# Patient Record
Sex: Female | Born: 1935 | Race: White | Hispanic: No | Smoking: Never smoker
Health system: Southern US, Community
[De-identification: ages and names within clinical notes are randomized; demographics above are authoritative.]

## PROBLEM LIST (undated history)

## (undated) DIAGNOSIS — N19 Unspecified kidney failure: Secondary | ICD-10-CM

## (undated) DIAGNOSIS — I10 Essential (primary) hypertension: Secondary | ICD-10-CM

## (undated) DIAGNOSIS — R809 Proteinuria, unspecified: Secondary | ICD-10-CM

## (undated) DIAGNOSIS — E119 Type 2 diabetes mellitus without complications: Secondary | ICD-10-CM

## (undated) DIAGNOSIS — M1990 Unspecified osteoarthritis, unspecified site: Secondary | ICD-10-CM

## (undated) HISTORY — PX: TOTAL HIP ARTHROPLASTY: SHX124

## (undated) HISTORY — PX: RENAL BIOPSY: SHX156

## (undated) HISTORY — PX: EYE SURGERY: SHX253

---

## 2011-12-28 DIAGNOSIS — E785 Hyperlipidemia, unspecified: Secondary | ICD-10-CM | POA: Insufficient documentation

## 2011-12-28 DIAGNOSIS — H269 Unspecified cataract: Secondary | ICD-10-CM | POA: Insufficient documentation

## 2011-12-28 DIAGNOSIS — M810 Age-related osteoporosis without current pathological fracture: Secondary | ICD-10-CM | POA: Insufficient documentation

## 2012-02-08 DIAGNOSIS — R600 Localized edema: Secondary | ICD-10-CM | POA: Insufficient documentation

## 2012-03-13 DIAGNOSIS — K5900 Constipation, unspecified: Secondary | ICD-10-CM | POA: Insufficient documentation

## 2012-04-10 DIAGNOSIS — M1712 Unilateral primary osteoarthritis, left knee: Secondary | ICD-10-CM | POA: Insufficient documentation

## 2012-07-08 DIAGNOSIS — Z96649 Presence of unspecified artificial hip joint: Secondary | ICD-10-CM | POA: Insufficient documentation

## 2012-07-16 DIAGNOSIS — G894 Chronic pain syndrome: Secondary | ICD-10-CM | POA: Insufficient documentation

## 2012-09-18 DIAGNOSIS — N185 Chronic kidney disease, stage 5: Secondary | ICD-10-CM | POA: Insufficient documentation

## 2013-01-21 DIAGNOSIS — D649 Anemia, unspecified: Secondary | ICD-10-CM | POA: Insufficient documentation

## 2015-05-03 DIAGNOSIS — I491 Atrial premature depolarization: Secondary | ICD-10-CM | POA: Insufficient documentation

## 2015-05-03 DIAGNOSIS — R001 Bradycardia, unspecified: Secondary | ICD-10-CM | POA: Insufficient documentation

## 2015-06-29 DIAGNOSIS — L602 Onychogryphosis: Secondary | ICD-10-CM | POA: Insufficient documentation

## 2015-06-29 DIAGNOSIS — B351 Tinea unguium: Secondary | ICD-10-CM | POA: Insufficient documentation

## 2016-03-06 DIAGNOSIS — M059 Rheumatoid arthritis with rheumatoid factor, unspecified: Secondary | ICD-10-CM | POA: Insufficient documentation

## 2016-07-02 ENCOUNTER — Encounter: Payer: Self-pay | Admitting: Emergency Medicine

## 2016-07-02 ENCOUNTER — Inpatient Hospital Stay
Admission: EM | Admit: 2016-07-02 | Discharge: 2016-07-04 | DRG: 690 | Disposition: A | Discharge status: Home Health | Payer: Medicare Other | Attending: Internal Medicine | Admitting: Internal Medicine

## 2016-07-02 DIAGNOSIS — Z9109 Other allergy status, other than to drugs and biological substances: Secondary | ICD-10-CM

## 2016-07-02 DIAGNOSIS — R609 Edema, unspecified: Secondary | ICD-10-CM

## 2016-07-02 DIAGNOSIS — N10 Acute tubulo-interstitial nephritis: Principal | ICD-10-CM | POA: Diagnosis present

## 2016-07-02 DIAGNOSIS — Z79899 Other long term (current) drug therapy: Secondary | ICD-10-CM

## 2016-07-02 DIAGNOSIS — N184 Chronic kidney disease, stage 4 (severe): Secondary | ICD-10-CM | POA: Diagnosis present

## 2016-07-02 DIAGNOSIS — R109 Unspecified abdominal pain: Secondary | ICD-10-CM

## 2016-07-02 DIAGNOSIS — Z1611 Resistance to penicillins: Secondary | ICD-10-CM | POA: Diagnosis present

## 2016-07-02 DIAGNOSIS — R300 Dysuria: Secondary | ICD-10-CM

## 2016-07-02 DIAGNOSIS — D72829 Elevated white blood cell count, unspecified: Secondary | ICD-10-CM

## 2016-07-02 DIAGNOSIS — E1122 Type 2 diabetes mellitus with diabetic chronic kidney disease: Secondary | ICD-10-CM | POA: Diagnosis present

## 2016-07-02 DIAGNOSIS — B9620 Unspecified Escherichia coli [E. coli] as the cause of diseases classified elsewhere: Secondary | ICD-10-CM

## 2016-07-02 DIAGNOSIS — I129 Hypertensive chronic kidney disease with stage 1 through stage 4 chronic kidney disease, or unspecified chronic kidney disease: Secondary | ICD-10-CM | POA: Diagnosis present

## 2016-07-02 DIAGNOSIS — R319 Hematuria, unspecified: Secondary | ICD-10-CM | POA: Diagnosis present

## 2016-07-02 DIAGNOSIS — N390 Urinary tract infection, site not specified: Secondary | ICD-10-CM

## 2016-07-02 DIAGNOSIS — Z96641 Presence of right artificial hip joint: Secondary | ICD-10-CM | POA: Diagnosis present

## 2016-07-02 DIAGNOSIS — E1121 Type 2 diabetes mellitus with diabetic nephropathy: Secondary | ICD-10-CM | POA: Diagnosis present

## 2016-07-02 DIAGNOSIS — Z7982 Long term (current) use of aspirin: Secondary | ICD-10-CM

## 2016-07-02 DIAGNOSIS — N12 Tubulo-interstitial nephritis, not specified as acute or chronic: Secondary | ICD-10-CM

## 2016-07-02 DIAGNOSIS — N19 Unspecified kidney failure: Secondary | ICD-10-CM

## 2016-07-02 DIAGNOSIS — Z1623 Resistance to quinolones and fluoroquinolones: Secondary | ICD-10-CM | POA: Diagnosis present

## 2016-07-02 DIAGNOSIS — N179 Acute kidney failure, unspecified: Secondary | ICD-10-CM | POA: Diagnosis present

## 2016-07-02 DIAGNOSIS — I10 Essential (primary) hypertension: Secondary | ICD-10-CM

## 2016-07-02 DIAGNOSIS — N189 Chronic kidney disease, unspecified: Secondary | ICD-10-CM

## 2016-07-02 HISTORY — DX: Proteinuria, unspecified: R80.9

## 2016-07-02 HISTORY — DX: Unspecified kidney failure: N19

## 2016-07-02 HISTORY — DX: Essential (primary) hypertension: I10

## 2016-07-02 HISTORY — DX: Type 2 diabetes mellitus without complications: E11.9

## 2016-07-02 LAB — CBC WITH DIFFERENTIAL/PLATELET
BASOS ABS: 0.1 10*3/uL (ref 0–0.1)
Basophils Relative: 0 %
Eosinophils Absolute: 0.2 10*3/uL (ref 0–0.7)
Eosinophils Relative: 1 %
HEMATOCRIT: 40.4 % (ref 35.0–47.0)
HEMOGLOBIN: 13.8 g/dL (ref 12.0–16.0)
Lymphocytes Relative: 6 %
Lymphs Abs: 1 10*3/uL (ref 1.0–3.6)
MCH: 30 pg (ref 26.0–34.0)
MCHC: 34.2 g/dL (ref 32.0–36.0)
MCV: 87.9 fL (ref 80.0–100.0)
MONOS PCT: 7 %
Monocytes Absolute: 1.3 10*3/uL — ABNORMAL HIGH (ref 0.2–0.9)
NEUTROS ABS: 14.8 10*3/uL — AB (ref 1.4–6.5)
NEUTROS PCT: 86 %
Platelets: 224 10*3/uL (ref 150–440)
RBC: 4.6 MIL/uL (ref 3.80–5.20)
RDW: 13.8 % (ref 11.5–14.5)
WBC: 17.4 10*3/uL — ABNORMAL HIGH (ref 3.6–11.0)

## 2016-07-02 LAB — URINALYSIS COMPLETE WITH MICROSCOPIC (ARMC ONLY)
Bilirubin Urine: NEGATIVE
Glucose, UA: NEGATIVE mg/dL
Ketones, ur: NEGATIVE mg/dL
NITRITE: NEGATIVE
PH: 6 (ref 5.0–8.0)
Protein, ur: 100 mg/dL — AB
Specific Gravity, Urine: 1.006 (ref 1.005–1.030)
Squamous Epithelial / LPF: NONE SEEN

## 2016-07-02 LAB — COMPREHENSIVE METABOLIC PANEL
ALBUMIN: 4.2 g/dL (ref 3.5–5.0)
ALT: 14 U/L (ref 14–54)
AST: 20 U/L (ref 15–41)
Alkaline Phosphatase: 69 U/L (ref 38–126)
Anion gap: 8 (ref 5–15)
BILIRUBIN TOTAL: 0.6 mg/dL (ref 0.3–1.2)
BUN: 39 mg/dL — ABNORMAL HIGH (ref 6–20)
CHLORIDE: 106 mmol/L (ref 101–111)
CO2: 26 mmol/L (ref 22–32)
Calcium: 9.8 mg/dL (ref 8.9–10.3)
Creatinine, Ser: 2.37 mg/dL — ABNORMAL HIGH (ref 0.44–1.00)
GFR calc Af Amer: 21 mL/min — ABNORMAL LOW (ref >60)
GFR, EST NON AFRICAN AMERICAN: 19 mL/min — AB (ref >60)
Glucose, Bld: 145 mg/dL — ABNORMAL HIGH (ref 65–99)
POTASSIUM: 3.8 mmol/L (ref 3.5–5.1)
Sodium: 140 mmol/L (ref 135–145)
TOTAL PROTEIN: 7.3 g/dL (ref 6.5–8.1)

## 2016-07-02 LAB — LACTIC ACID, PLASMA
LACTIC ACID, VENOUS: 1.6 mmol/L (ref 0.5–1.9)
LACTIC ACID, VENOUS: 1.4 mmol/L (ref 0.5–1.9)

## 2016-07-02 LAB — GLUCOSE, CAPILLARY
Glucose-Capillary: 119 mg/dL — ABNORMAL HIGH (ref 65–99)
Glucose-Capillary: 148 mg/dL — ABNORMAL HIGH (ref 65–99)

## 2016-07-02 MED ORDER — INSULIN ASPART 100 UNIT/ML ~~LOC~~ SOLN: 0.0000 [IU] | Freq: Three times a day (TID) | SUBCUTANEOUS | Status: DC

## 2016-07-02 MED ORDER — ACETAMINOPHEN 650 MG RE SUPP: 650.0000 mg | Freq: Four times a day (QID) | RECTAL | Status: DC | PRN

## 2016-07-02 MED ORDER — HEPARIN SODIUM (PORCINE) 5000 UNIT/ML IJ SOLN
5000.0000 [IU] | Freq: Three times a day (TID) | INTRAMUSCULAR | Status: DC
  Administered 2016-07-02 – 2016-07-03 (×2): 5000 [IU] via SUBCUTANEOUS
  Filled 2016-07-02 (×2): qty 1

## 2016-07-02 MED ORDER — CEFTRIAXONE SODIUM 1 G IJ SOLR
1.0000 g | Freq: Two times a day (BID) | INTRAMUSCULAR | Status: DC
  Administered 2016-07-02 – 2016-07-03 (×3): 1 g via INTRAVENOUS
  Filled 2016-07-02 (×5): qty 10

## 2016-07-02 MED ORDER — ONDANSETRON HCL 4 MG/2ML IJ SOLN: 4.0000 mg | Freq: Four times a day (QID) | INTRAMUSCULAR | Status: DC | PRN

## 2016-07-02 MED ORDER — ONDANSETRON HCL 4 MG PO TABS: 4.0000 mg | ORAL_TABLET | Freq: Four times a day (QID) | ORAL | Status: DC | PRN

## 2016-07-02 MED ORDER — LEVOTHYROXINE SODIUM 137 MCG PO TABS
137.0000 ug | ORAL_TABLET | Freq: Every day | ORAL | Status: DC
  Administered 2016-07-03: 137 ug via ORAL
  Filled 2016-07-02 (×2): qty 1

## 2016-07-02 MED ORDER — SODIUM CHLORIDE 0.9 % IV SOLN
INTRAVENOUS | Status: DC
  Administered 2016-07-02 – 2016-07-04 (×3): via INTRAVENOUS

## 2016-07-02 MED ORDER — DEXTROSE 5 % IV SOLN
1.0000 g | Freq: Once | INTRAVENOUS | Status: AC
  Administered 2016-07-02: 1 g via INTRAVENOUS
  Filled 2016-07-02: qty 10

## 2016-07-02 MED ORDER — AMLODIPINE BESYLATE 5 MG PO TABS
2.5000 mg | ORAL_TABLET | ORAL | Status: DC
  Administered 2016-07-03: 2.5 mg via ORAL
  Filled 2016-07-02: qty 1

## 2016-07-02 MED ORDER — MORPHINE SULFATE (PF) 2 MG/ML IV SOLN: 2.0000 mg | INTRAVENOUS | Status: DC | PRN

## 2016-07-02 MED ORDER — FERROUS SULFATE 325 (65 FE) MG PO TABS
325.0000 mg | ORAL_TABLET | Freq: Every day | ORAL | Status: DC
  Administered 2016-07-03: 325 mg via ORAL
  Filled 2016-07-02: qty 1

## 2016-07-02 MED ORDER — OXYCODONE HCL 5 MG PO TABS
10.0000 mg | ORAL_TABLET | Freq: Four times a day (QID) | ORAL | Status: DC | PRN
  Administered 2016-07-02 – 2016-07-04 (×2): 10 mg via ORAL
  Filled 2016-07-02 (×2): qty 2

## 2016-07-02 MED ORDER — VITAMIN D 1000 UNITS PO TABS
2000.0000 [IU] | ORAL_TABLET | ORAL | Status: DC
  Administered 2016-07-03 – 2016-07-04 (×2): 2000 [IU] via ORAL
  Filled 2016-07-02 (×2): qty 2

## 2016-07-02 MED ORDER — DEXTROSE 5 % IV SOLN: 1.0000 g | Freq: Two times a day (BID) | INTRAVENOUS | Status: DC

## 2016-07-02 MED ORDER — VITAMIN B-12 1000 MCG PO TABS
1000.0000 ug | ORAL_TABLET | ORAL | Status: DC
  Administered 2016-07-03 – 2016-07-04 (×2): 1000 ug via ORAL
  Filled 2016-07-02 (×2): qty 1

## 2016-07-02 MED ORDER — SODIUM CHLORIDE 0.9 % IV BOLUS (SEPSIS)
1000.0000 mL | Freq: Once | INTRAVENOUS | Status: AC
  Administered 2016-07-02: 1000 mL via INTRAVENOUS

## 2016-07-02 MED ORDER — DOCUSATE SODIUM 100 MG PO CAPS
100.0000 mg | ORAL_CAPSULE | Freq: Two times a day (BID) | ORAL | Status: DC
  Administered 2016-07-02 – 2016-07-03 (×3): 100 mg via ORAL
  Filled 2016-07-02 (×4): qty 1

## 2016-07-02 MED ORDER — ACETAMINOPHEN 325 MG PO TABS: 650.0000 mg | ORAL_TABLET | Freq: Four times a day (QID) | ORAL | Status: DC | PRN

## 2016-07-02 MED ORDER — HYDROXYCHLOROQUINE SULFATE 200 MG PO TABS
200.0000 mg | ORAL_TABLET | ORAL | Status: DC
  Administered 2016-07-03 – 2016-07-04 (×2): 200 mg via ORAL
  Filled 2016-07-02 (×2): qty 1

## 2016-07-02 MED ORDER — GABAPENTIN 100 MG PO CAPS
100.0000 mg | ORAL_CAPSULE | Freq: Every day | ORAL | Status: DC
  Administered 2016-07-02 – 2016-07-03 (×2): 100 mg via ORAL
  Filled 2016-07-02 (×2): qty 1

## 2016-07-02 MED ORDER — BISACODYL 10 MG RE SUPP: 10.0000 mg | Freq: Every day | RECTAL | Status: DC | PRN

## 2016-07-02 MED ORDER — PANTOPRAZOLE SODIUM 40 MG PO TBEC
40.0000 mg | DELAYED_RELEASE_TABLET | Freq: Every day | ORAL | Status: DC
  Administered 2016-07-02 – 2016-07-03 (×2): 40 mg via ORAL
  Filled 2016-07-02 (×3): qty 1

## 2016-07-02 MED ORDER — ASPIRIN EC 81 MG PO TBEC
81.0000 mg | DELAYED_RELEASE_TABLET | ORAL | Status: DC
  Administered 2016-07-03: 81 mg via ORAL
  Filled 2016-07-02: qty 1

## 2016-07-02 MED ORDER — PREDNISONE 10 MG PO TABS
5.0000 mg | ORAL_TABLET | ORAL | Status: DC
  Administered 2016-07-03 – 2016-07-04 (×2): 5 mg via ORAL
  Filled 2016-07-02 (×2): qty 1

## 2016-07-02 NOTE — ED Notes (Signed)
Patient transported to floor. Clean brief on patient. No further needs at this time.

## 2016-07-02 NOTE — ED Notes (Signed)
Attempted to call report x 1  

## 2016-07-02 NOTE — ED Notes (Signed)
Soiled brief changed at this time. 

## 2016-07-02 NOTE — ED Triage Notes (Signed)
Per [facility], patient comes from home with c/o hematuria. Patient had a left kidney biopsy done at [facility] on 06/28/16. Patient states she noticed the bleeding this morning. Patient also c/o 4/10 lower abd pain. Patient is A&O x4. Patient denies dysuria. Patient did take an 10mg  of oxycodone around 0645.

## 2016-07-02 NOTE — ED Provider Notes (Signed)
[facility] Emergency Department Provider Note   ____________________________________________  Time seen:  I have reviewed the triage vital signs and the triage nursing note.  HISTORY  Chief Complaint Hematuria   Historian Patient  HPI Christina Mercado is a 80 y.o. female with a history of kidney disease hypertension and diabetes who had a renal biopsy on 06/28/16 with a [facility] urologist, presents today because of frequency of urination over the last 24 hours, hematuria over the last 24 hours, and suprapubic discomfort the last 24 hours.  After the left-sided renal biopsy a few days ago she didn't have any issues until about 24 hours ago.  Nothing seems to make it worse or better. No fevers. No vomiting. No back or flank pain.    Past Medical History:  Diagnosis Date  . Diabetes mellitus without complication ([place])   . Hypertension   . Kidney failure    stage 3  . Proteinuria     There are no active problems to display for this patient.   Past Surgical History:  Procedure Laterality Date  . RENAL BIOPSY    . TOTAL HIP ARTHROPLASTY Right       Allergies Byetta 10 mcg pen [exenatide]  No family history on file.  Social History Social History  Substance Use Topics  . Smoking status: Never Smoker  . Smokeless tobacco: Never Used  . Alcohol use No    Review of Systems  Constitutional: Negative for fever. Eyes: Negative for visual changes. ENT: Negative for sore throat. Cardiovascular: Negative for chest pain. Respiratory: Negative for shortness of breath. Gastrointestinal: Negative for vomiting and diarrhea. Genitourinary: Positive for dysuria. Musculoskeletal: Negative for back pain. Skin: Negative for rash. Neurological: Negative for headache. 10 point Review of Systems otherwise negative ____________________________________________   PHYSICAL EXAM:  VITAL SIGNS: ED Triage Vitals  Enc Vitals Group     BP 07/02/16 0824 (!)  186/79     Pulse Rate 07/02/16 0824 93     Resp 07/02/16 0900 (!) 21     Temp 07/02/16 0824 98.4 F (36.9 C)     Temp src --      SpO2 07/02/16 0824 100 %     Weight 07/02/16 0824 222 lb (100.7 kg)     Height 07/02/16 0824 5\' 6"  (1.676 m)     Head Circumference --      Peak Flow --      Pain Score 07/02/16 0824 4     Pain Loc --      Pain Edu? --      Excl. in [place]? --      Constitutional: Alert and oriented. Well appearing and in no distress. HEENT   Head: Normocephalic and atraumatic.      Eyes: Conjunctivae are normal. PERRL. Normal extraocular movements.      Ears:         Nose: No congestion/rhinnorhea.   Mouth/Throat: Mucous membranes are moist.   Neck: No stridor. Cardiovascular/Chest: Normal rate, regular rhythm.  No murmurs, rubs, or gallops. Respiratory: Normal respiratory effort without tachypnea nor retractions. Breath sounds are clear and equal bilaterally. No wheezes/rales/rhonchi. Gastrointestinal: Soft. No distention, no guarding, no rebound. Nontender.  Mild suprapubic discomfort. No focal McBurney's point tenderness. No upper abdominal tenderness.  Genitourinary/rectal:Deferred Musculoskeletal: Nontender with normal range of motion in all extremities. No joint effusions.  No lower extremity tenderness.  No edema. Neurologic:  Normal speech and language. No gross or focal neurologic deficits are appreciated. Skin:  Skin is  warm, dry and intact. No rash noted. Psychiatric: Mood and affect are normal. Speech and behavior are normal. Patient exhibits appropriate insight and judgment.  ____________________________________________   EKG I, Lisa Roca, MD, the attending physician have personally viewed and interpreted all ECGs.  None ____________________________________________  LABS (pertinent positives/negatives)  Labs Reviewed  COMPREHENSIVE METABOLIC PANEL - Abnormal; Notable for the following:       Result Value   Glucose, Bld 145 (*)    BUN  39 (*)    Creatinine, Ser 2.37 (*)    GFR calc non Af Amer 19 (*)    GFR calc Af Amer 21 (*)    All other components within normal limits  CBC WITH DIFFERENTIAL/PLATELET - Abnormal; Notable for the following:    WBC 17.4 (*)    Neutro Abs 14.8 (*)    Monocytes Absolute 1.3 (*)    All other components within normal limits  URINALYSIS COMPLETEWITH MICROSCOPIC ([facility] ONLY) - Abnormal; Notable for the following:    Color, Urine STRAW (*)    APPearance HAZY (*)    Hgb urine dipstick 3+ (*)    Protein, ur 100 (*)    Leukocytes, UA 2+ (*)    Bacteria, UA RARE (*)    All other components within normal limits  URINE CULTURE  CULTURE, BLOOD (ROUTINE X 2)  CULTURE, BLOOD (ROUTINE X 2)  LACTIC ACID, PLASMA  LACTIC ACID, PLASMA    ____________________________________________  RADIOLOGY All Xrays were viewed by me. Imaging interpreted by Radiologist.  None __________________________________________  PROCEDURES  Procedure(s) performed: None  Critical Care performed: None  ____________________________________________   ED COURSE / ASSESSMENT AND PLAN  Pertinent labs & imaging results that were available during my care of the patient were reviewed by me and considered in my medical decision making (see chart for details).   This patient is reporting symptoms that sound clinically consistent with UTI/pyelonephritis is not. She did have a recent renal biopsy, but she is not complaining of flank pain. She's not febrile but she does have some generalized weakness.  Her vitals are stable. Her urinalysis is consistent with a urinary tract infection. With her white blood cell count significantly elevated at 17, and her BUN and creatinine elevated without a baseline, I feel she needs hospital management/treatment.  Rocephin IV was given after blood cultures and urine cultures.  The patient states that she has a history of stage III renal disease, but she does not know her baseline  creatinine, and is there is no record of this that I can find.    CONSULTATIONS:   Hospitalist for admission.  Patient / Family / Caregiver informed of clinical course, medical decision-making process, and agree with plan.   I discussed return precautions, follow-up instructions, and discharged instructions with patient and/or family.   ___________________________________________   FINAL CLINICAL IMPRESSION(S) / ED DIAGNOSES   Final diagnoses:  Hematuria  Dysuria  UTI (lower urinary tract infection)  Renal failure              Note: This dictation was prepared with Dragon dictation. Any transcriptional errors that result from this process are unintentional    Lisa Roca, MD 07/02/16 1210

## 2016-07-02 NOTE — ED Notes (Signed)
Report given to Lee, RN.

## 2016-07-02 NOTE — ED Notes (Signed)
Soiled underwear removed from patient. Clean, dry pad placed under patient. Bed pan offered. Call light within reach. Patient states, "I just can't even hold it. It just comes out".

## 2016-07-02 NOTE — H&P (Signed)
History and Physical    Christina Mercado [id number] DOB: [birth year] DOA: 07/02/2016  Referring physician: Dr. Reita Cliche PCP: Ballard Russell, MD  Specialists: none  Chief Complaint: abdominal pain with hematuria  HPI: Christina Mercado is a 80 y.o. female has a past medical history significant for CKD with worsening proteinuria with recent renal biopsy now with severe suprapubic pain with dysuria/hematuria and nausea. WBC=17k. Afebrile. GFR=21. She is now admitted. Has known DM as well. Denies CP or SOB. UA grossly abnormal  Review of Systems: The patient denies anorexia, fever, weight loss,, vision loss, decreased hearing, hoarseness, chest pain, syncope, dyspnea on exertion, peripheral edema, balance deficits, hemoptysis, melena, hematochezia, severe indigestion/heartburn, genital sores, muscle weakness, suspicious skin lesions, transient blindness, difficulty walking, depression, unusual weight change, abnormal bleeding, enlarged lymph nodes, angioedema, and breast masses.   Past Medical History:  Diagnosis Date  . Diabetes mellitus without complication ([place])   . Hypertension   . Kidney failure    stage 3  . Proteinuria    Past Surgical History:  Procedure Laterality Date  . RENAL BIOPSY    . TOTAL HIP ARTHROPLASTY Right    Social History:  reports that she has never smoked. She has never used smokeless tobacco. She reports that she does not drink alcohol. Her drug history is not on file.  Allergies  Allergen Reactions  . Byetta 10 Mcg Pen [Exenatide] Nausea And Vomiting and Other (See Comments)    Patient states this medication made her lose weight.    History reviewed. No pertinent family history.  Prior to Admission medications   Medication Sig Start Date End Date Taking? Authorizing Provider  amLODipine (NORVASC) 2.5 MG tablet Take 2.5 mg by mouth every morning.  06/17/16  Yes Historical Provider, MD  ASPIRIN LOW DOSE 81 MG EC tablet Take 81 mg by mouth every morning.   05/15/16  Yes Historical Provider, MD  Cholecalciferol (VITAMIN D3) 1000 units CAPS Take 2,000 Units by mouth every morning.   Yes Historical Provider, MD  ferrous sulfate 325 (65 FE) MG tablet Take 325 mg by mouth daily with breakfast.   Yes Historical Provider, MD  gabapentin (NEURONTIN) 100 MG capsule Take 100 mg by mouth at bedtime. 05/04/16  Yes Historical Provider, MD  hydroxychloroquine (PLAQUENIL) 200 MG tablet Take 200 mg by mouth every morning.  05/22/16  Yes Historical Provider, MD  levothyroxine (SYNTHROID, LEVOTHROID) 137 MCG tablet Take 137 mcg by mouth daily before breakfast. 06/17/16  Yes Historical Provider, MD  Oxycodone HCl 10 MG TABS Take 10 mg by mouth every 6 (six) hours as needed for pain. 06/21/16  Yes Historical Provider, MD  predniSONE (DELTASONE) 5 MG tablet Take 5 mg by mouth every morning.  05/15/16  Yes Historical Provider, MD  vitamin B-12 (CYANOCOBALAMIN) 1000 MCG tablet Take 1,000 mcg by mouth every morning.   Yes Historical Provider, MD   Physical Exam: Vitals:   07/02/16 1100 07/02/16 1130 07/02/16 1207 07/02/16 1230  BP: (!) 173/75 (!) 162/73 (!) 177/74 (!) 170/70  Pulse: 95 96 89 81  Resp: 20 19 12 20   Temp:      SpO2: 93% 98% 98% 97%  Weight:      Height:         General:  No apparent distress, WDWN, [place]/AT  Eyes: PERRL, EOMI, no scleral icterus, conjunctiva clear  ENT: moist oropharynx without exudate, TM's benign, dentition good  Neck: supple, no lymphadenopathy. No bruits or thyromegaly  Cardiovascular: regular rate without MRG; 2+  peripheral pulses, no JVD, no peripheral edema  Respiratory: CTA biL, good air movement without wheezing, rhonchi or crackled. Respiratory effort normal  Abdomen: soft,  tender to palpation in the lower quadrants, positive bowel sounds, no guarding, no rebound  Skin: no rashes or lesions  Musculoskeletal: normal bulk and tone, no joint swelling  Psychiatric: normal mood and affect, A&OX3  Neurologic: CN 2-12 grossly  intact, Motor strength 5/5 in all 4 groups, DTR's symmetric, sensory exam non-focal  Labs on Admission:  Basic Metabolic Panel:  Recent Labs Lab 07/02/16 0831  NA 140  K 3.8  CL 106  CO2 26  GLUCOSE 145*  BUN 39*  CREATININE 2.37*  CALCIUM 9.8   Liver Function Tests:  Recent Labs Lab 07/02/16 0831  AST 20  ALT 14  ALKPHOS 69  BILITOT 0.6  PROT 7.3  ALBUMIN 4.2   No results for input(s): LIPASE, AMYLASE in the last 168 hours. No results for input(s): AMMONIA in the last 168 hours. CBC:  Recent Labs Lab 07/02/16 0831  WBC 17.4*  NEUTROABS 14.8*  HGB 13.8  HCT 40.4  MCV 87.9  PLT 224   Cardiac Enzymes: No results for input(s): CKTOTAL, CKMB, CKMBINDEX, TROPONINI in the last 168 hours.  BNP (last 3 results) No results for input(s): BNP in the last 8760 hours.  ProBNP (last 3 results) No results for input(s): PROBNP in the last 8760 hours.  CBG: No results for input(s): GLUCAP in the last 168 hours.  Radiological Exams on Admission: No results found.  EKG: Independently reviewed.  Assessment/Plan Principal Problem:   Pyelonephritis Active Problems:   Hematuria   Abdominal pain   CKD (chronic kidney disease) stage 4, GFR 15-29 ml/min (HCC)   Cultures sent. Will begin IV fluids and IV ABX. Consult Nephrology. Follow sugars. Repeat labs in AM.  Diet: clear liquids Fluids: NS@100  DVT Prophylaxis: SQ Heparin  Code Status: FULL  Family Communication: none  Disposition Plan: home  Time spent: 50 min

## 2016-07-03 ENCOUNTER — Inpatient Hospital Stay: Payer: Medicare Other

## 2016-07-03 LAB — CBC
HCT: 37 % (ref 35.0–47.0)
HEMOGLOBIN: 12.5 g/dL (ref 12.0–16.0)
MCH: 30.1 pg (ref 26.0–34.0)
MCHC: 33.9 g/dL (ref 32.0–36.0)
MCV: 88.8 fL (ref 80.0–100.0)
PLATELETS: 195 10*3/uL (ref 150–440)
RBC: 4.17 MIL/uL (ref 3.80–5.20)
RDW: 13.5 % (ref 11.5–14.5)
WBC: 10.9 10*3/uL (ref 3.6–11.0)

## 2016-07-03 LAB — COMPREHENSIVE METABOLIC PANEL
ALK PHOS: 51 U/L (ref 38–126)
ALT: 10 U/L — AB (ref 14–54)
ANION GAP: 7 (ref 5–15)
AST: 14 U/L — ABNORMAL LOW (ref 15–41)
Albumin: 3 g/dL — ABNORMAL LOW (ref 3.5–5.0)
BILIRUBIN TOTAL: 0.8 mg/dL (ref 0.3–1.2)
BUN: 29 mg/dL — ABNORMAL HIGH (ref 6–20)
CALCIUM: 8.9 mg/dL (ref 8.9–10.3)
CO2: 25 mmol/L (ref 22–32)
CREATININE: 1.97 mg/dL — AB (ref 0.44–1.00)
Chloride: 110 mmol/L (ref 101–111)
GFR, EST AFRICAN AMERICAN: 27 mL/min — AB (ref >60)
GFR, EST NON AFRICAN AMERICAN: 23 mL/min — AB (ref >60)
Glucose, Bld: 99 mg/dL (ref 65–99)
Potassium: 3.8 mmol/L (ref 3.5–5.1)
SODIUM: 142 mmol/L (ref 135–145)
TOTAL PROTEIN: 5.8 g/dL — AB (ref 6.5–8.1)

## 2016-07-03 LAB — GLUCOSE, CAPILLARY
GLUCOSE-CAPILLARY: 117 mg/dL — AB (ref 65–99)
GLUCOSE-CAPILLARY: 133 mg/dL — AB (ref 65–99)
Glucose-Capillary: 94 mg/dL (ref 65–99)
Glucose-Capillary: 109 mg/dL — ABNORMAL HIGH (ref 65–99)

## 2016-07-03 MED ORDER — AMLODIPINE BESYLATE 5 MG PO TABS
5.0000 mg | ORAL_TABLET | ORAL | Status: DC
  Administered 2016-07-04: 5 mg via ORAL
  Filled 2016-07-03: qty 1

## 2016-07-03 MED ORDER — METOPROLOL TARTRATE 25 MG PO TABS
25.0000 mg | ORAL_TABLET | Freq: Once | ORAL | Status: AC
  Administered 2016-07-03: 25 mg via ORAL
  Filled 2016-07-03: qty 1

## 2016-07-03 MED ORDER — HYDRALAZINE HCL 20 MG/ML IJ SOLN: 10.0000 mg | Freq: Four times a day (QID) | INTRAMUSCULAR | Status: DC | PRN

## 2016-07-03 NOTE — Progress Notes (Signed)
[facility] NAME: Christina Mercado    MR#:  [id number]  DATE OF BIRTH:  [birth year]  SUBJECTIVE:  CHIEF COMPLAINT:   Chief Complaint  Patient presents with  . Hematuria   The patient is 80 year old Caucasian female with past medical history significant for history of diabetes with nephropathy, CK D stage III, essential hypertension, proteinuria,  who presents to the hospital with complaints of severe suprapubic pain, dysuria, hematuria and nausea. On arrival to emergency room, she was noted to have elevated white blood cell count is 17,000, however, she was afebrile. Creatinine was found to be elevated at 2.37, estimated GFR of 19. Analysis revealed pyuria with hematuria, concerning for urinary tract infection/acute pyelonephritis.  Patient was admitted to the hospital, initiated on broad-spectrum antibiotic therapy, after blood and urinary cultures were taken. She feels better today, denies any pain suprapubically. Denies nausea    Review of Systems  Constitutional: Negative for chills, fever and weight loss.  HENT: Negative for congestion.   Eyes: Negative for blurred vision and double vision.  Respiratory: Negative for cough, sputum production, shortness of breath and wheezing.   Cardiovascular: Negative for chest pain, palpitations, orthopnea, leg swelling and PND.  Gastrointestinal: Negative for abdominal pain, blood in stool, constipation, diarrhea, nausea and vomiting.  Genitourinary: Negative for dysuria, frequency, hematuria and urgency.  Musculoskeletal: Negative for falls.  Neurological: Negative for dizziness, tremors, focal weakness and headaches.  Endo/Heme/Allergies: Does not bruise/bleed easily.  Psychiatric/Behavioral: Negative for depression. The patient does not have insomnia.     VITAL SIGNS: Blood pressure (!) 185/58, pulse 70, temperature 98.6 F (37 C), temperature source Oral, resp. rate 16, height 5\' 6"   (1.676 m), weight 99.8 kg (220 lb 1.6 oz), SpO2 97 %.  PHYSICAL EXAMINATION:   GENERAL:  80 y.o.-year-old patient lying in the bed with no acute distress. Pale, chronically ill appearing EYES: Pupils equal, round, reactive to light and accommodation. No scleral icterus. Extraocular muscles intact.  HEENT: Head atraumatic, normocephalic. Oropharynx and nasopharynx clear.  NECK:  Supple, no jugular venous distention. No thyroid enlargement, no tenderness.  LUNGS: Normal breath sounds bilaterally, no wheezing, rales,rhonchi or crepitation. No use of accessory muscles of respiration.  CARDIOVASCULAR: S1, S2 normal. No murmurs, rubs, or gallops.  ABDOMEN: Soft, nontender, nondistended. Bowel sounds present. No organomegaly or mass.  EXTREMITIES: No pedal edema, cyanosis, or clubbing. Mild discomfort in the left lower extremity on palpation of the calf.  NEUROLOGIC: Cranial nerves II through XII are intact. Muscle strength 5/5 in all extremities. Sensation intact. Gait not checked.  PSYCHIATRIC: The patient is alert and oriented x 3.  SKIN: No obvious rash, lesion, or ulcer.   ORDERS/RESULTS REVIEWED:   CBC  Recent Labs Lab 07/02/16 0831 07/03/16 0538  WBC 17.4* 10.9  HGB 13.8 12.5  HCT 40.4 37.0  PLT 224 195  MCV 87.9 88.8  MCH 30.0 30.1  MCHC 34.2 33.9  RDW 13.8 13.5  LYMPHSABS 1.0  --   MONOABS 1.3*  --   EOSABS 0.2  --   BASOSABS 0.1  --    ------------------------------------------------------------------------------------------------------------------  Chemistries   Recent Labs Lab 07/02/16 0831 07/03/16 0538  NA 140 142  K 3.8 3.8  CL 106 110  CO2 26 25  GLUCOSE 145* 99  BUN 39* 29*  CREATININE 2.37* 1.97*  CALCIUM 9.8 8.9  AST 20 14*  ALT 14 10*  ALKPHOS 69 51  BILITOT 0.6 0.8   ------------------------------------------------------------------------------------------------------------------  estimated creatinine clearance is 28.1 mL/min (by C-G formula  based on SCr of 1.97 mg/dL). ------------------------------------------------------------------------------------------------------------------ No results for input(s): TSH, T4TOTAL, T3FREE, THYROIDAB in the last 72 hours.  Invalid input(s): FREET3  Cardiac Enzymes No results for input(s): CKMB, TROPONINI, MYOGLOBIN in the last 168 hours.  Invalid input(s): CK ------------------------------------------------------------------------------------------------------------------ Invalid input(s): POCBNP ---------------------------------------------------------------------------------------------------------------  RADIOLOGY: US Renal  Result Date: 07/03/2016 CLINICAL DATA:  Acute renal failure EXAM: RENAL / URINARY TRACT ULTRASOUND COMPLETE COMPARISON:  None. FINDINGS: Right Kidney: Length: 9.1 cm. Mild cortical thinning. 7 mm cyst in the midpole. Mildly increased echotexture throughout the right kidney. No hydronephrosis. Left Kidney: Length: 9.0 cm. Echogenicity within normal limits. No mass or hydronephrosis visualized. Bladder: Appears normal for degree of bladder distention. IMPRESSION: Mild cortical thinning and increased echotexture on the right. No acute findings. No hydronephrosis. Electronically Signed   By: Rolm Baptise M.D.   On: 07/03/2016 11:02   EKG: No orders found for this or any previous visit.  ASSESSMENT AND PLAN:  Principal Problem:   Pyelonephritis Active Problems:   Hematuria   Abdominal pain   CKD (chronic kidney disease) stage 4, GFR 15-29 ml/min (HCC) #1. Acute on chronic renal failure, improved on IV fluid administration, bladder scan, patient every 8 hours, to rule out urinary retention, follow kidney function in the morning, renal ultrasound revealed increased echotexture on the right, but no acute findings, no hydronephrosis #2. Questionable Urinary tract infection/ acute pyelonephritis, continue antibiotic therapy, awaiting for culture results, blood cultures  are negative so far, urine culture is pending #3. Leukocytosis, resolved with antibiotic therapy #4, Hematuria, concerning due to recent renal biopsy, get nephrologist involved, getting bladder scan intermittently to rule out urinary retention. Hemoglobin level remains stable. Discontinue aspirin and heparin subcutaneously #5 left lower extremity swelling, pain on palpation, rule out DVT, get Doppler ultrasound  Management plans discussed with the patient, family and they are in agreement.   DRUG ALLERGIES:  Allergies  Allergen Reactions  . Byetta 10 Mcg Pen [Exenatide] Nausea And Vomiting and Other (See Comments)    Patient states this medication made her lose weight.    CODE STATUS:     Code Status Orders        Start     Ordered   07/02/16 1421  Full code  Continuous     07/02/16 1420    Code Status History    Date Active Date Inactive Code Status Order ID Comments User Context   This patient has a current code status but no historical code status.    Advance Directive Documentation   Flowsheet Row Most Recent Value  Type of Advance Directive  Healthcare Power of Attorney, Living will  Pre-existing out of facility DNR order (yellow form or pink MOST form)  No data  "MOST" Form in Place?  No data      TOTAL TIME TAKING CARE OF THIS PATIENT: 45 minutes.  Discussed with patient's son, all questions were answered  Annelisa Ryback M.D on 07/03/2016 at 12:13 PM  Between 7am to 6pm - Pager - [phone]  After 6pm go to [email or web address] - password [email or web address] [facility]  Office  [phone]  CC: Primary care physician; Ballard Russell, MD

## 2016-07-03 NOTE — Consult Note (Signed)
[facility] CONSULT NOTE    Date: 07/03/2016                  Patient Name:  Christina Mercado  MRN: [id number]  DOB: Jun 10, 1936  Age / Sex: 80 y.o., female         PCP: Ballard Russell, MD                 Service Requesting Consult: Dr. Norman Herrlich                 Reason for Consult: Hematuria post renal biopsy, CKD stage III            History of Present Illness: Patient is a 80 y.o. female with a PMHx of diabetes mellitus type 2, hypertension, chronic kidney disease stage III, proteinuria, who was admitted to [facility] on 07/02/2016 for evaluation of hematuria.  History is obtained from the patient as well as chart review and discussion with her outpatient nephrologist.  The patient underwent renal biopsy on 05/22/16 for increasing proteinuria.  Her baseline creatinine ranges between 2-2.0.  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  The patient has not yet been made aware of this.  In the past she has seen hematology for underlying MGUS.  One night prior to admission the patient developed gross hematuria.  The patient started her aspirin shortly after her renal biopsy.  Renal ultrasound was performed and did not demonstrate obvious renal hematoma.   Medications: Outpatient medications: Prescriptions Prior to Admission  Medication Sig Dispense Refill Last Dose  . amLODipine (NORVASC) 2.5 MG tablet Take 2.5 mg by mouth every morning.    07/02/2016 at Unknown time  . ASPIRIN LOW DOSE 81 MG EC tablet Take 81 mg by mouth every morning.   3 07/02/2016 at Unknown time  . Cholecalciferol (VITAMIN D3) 1000 units CAPS Take 2,000 Units by mouth every morning.   07/02/2016 at Unknown time  . ferrous sulfate 325 (65 FE) MG tablet Take 325 mg by mouth daily with breakfast.   07/02/2016 at Unknown time  . gabapentin (NEURONTIN) 100 MG capsule Take 100 mg by mouth at bedtime.   07/01/2016 at Unknown time  . hydroxychloroquine  (PLAQUENIL) 200 MG tablet Take 200 mg by mouth every morning.    07/02/2016 at Unknown time  . levothyroxine (SYNTHROID, LEVOTHROID) 137 MCG tablet Take 137 mcg by mouth daily before breakfast.   07/02/2016 at Unknown time  . Oxycodone HCl 10 MG TABS Take 10 mg by mouth every 6 (six) hours as needed for pain.  0 07/01/2016 at Unknown time  . predniSONE (DELTASONE) 5 MG tablet Take 5 mg by mouth every morning.    07/02/2016 at Unknown time  . vitamin B-12 (CYANOCOBALAMIN) 1000 MCG tablet Take 1,000 mcg by mouth every morning.   07/02/2016 at Unknown time    Current medications: Current Facility-Administered Medications  Medication Dose Route Frequency Provider Last Rate Last Dose  . 0.9 %  sodium chloride infusion   Intravenous Continuous Idelle Crouch, MD 100 mL/hr at 07/03/16 0328    . acetaminophen (TYLENOL) tablet 650 mg  650 mg Oral Q6H PRN Idelle Crouch, MD       Or  . acetaminophen (TYLENOL) suppository 650 mg  650 mg Rectal Q6H PRN Idelle Crouch, MD      . amLODipine (NORVASC) tablet 2.5 mg  2.5 mg Oral BH-q7a Idelle Crouch, MD  2.5 mg at 07/03/16 0509  . bisacodyl (DULCOLAX) suppository 10 mg  10 mg Rectal Daily PRN Idelle Crouch, MD      . cefTRIAXone (ROCEPHIN) 1 g in dextrose 5 % 50 mL IVPB  1 g Intravenous Q12H Vira Blanco, RPH   1 g at 07/03/16 1121  . cholecalciferol (VITAMIN D) tablet 2,000 Units  2,000 Units Oral [id number] Idelle Crouch, MD   2,000 Units at 07/03/16 1120  . docusate sodium (COLACE) capsule 100 mg  100 mg Oral BID Idelle Crouch, MD   100 mg at 07/03/16 1121  . ferrous sulfate tablet 325 mg  325 mg Oral Q breakfast Idelle Crouch, MD   325 mg at 07/03/16 0803  . gabapentin (NEURONTIN) capsule 100 mg  100 mg Oral QHS Idelle Crouch, MD   100 mg at 07/02/16 2114  . hydroxychloroquine (PLAQUENIL) tablet 200 mg  200 mg Oral BH-q7a Idelle Crouch, MD   200 mg at 07/03/16 1121  . insulin aspart (novoLOG) injection 0-9 Units  0-9 Units Subcutaneous  TID WC Idelle Crouch, MD      . levothyroxine (SYNTHROID, LEVOTHROID) tablet 137 mcg  137 mcg Oral QAC breakfast Idelle Crouch, MD   137 mcg at 07/03/16 0803  . morphine 2 MG/ML injection 2 mg  2 mg Intravenous Q2H PRN Idelle Crouch, MD      . ondansetron [facility]) tablet 4 mg  4 mg Oral Q6H PRN Idelle Crouch, MD       Or  . ondansetron [facility]) injection 4 mg  4 mg Intravenous Q6H PRN Idelle Crouch, MD      . oxyCODONE (Oxy IR/ROXICODONE) immediate release tablet 10 mg  10 mg Oral Q6H PRN Idelle Crouch, MD   10 mg at 07/02/16 2114  . pantoprazole (PROTONIX) EC tablet 40 mg  40 mg Oral Daily Idelle Crouch, MD   40 mg at 07/03/16 1121  . predniSONE (DELTASONE) tablet 5 mg  5 mg Oral BH-q7a Idelle Crouch, MD   5 mg at 07/03/16 1120  . vitamin B-12 (CYANOCOBALAMIN) tablet 1,000 mcg  1,000 mcg Oral BH-q7a Idelle Crouch, MD   1,000 mcg at 07/03/16 1120      Allergies: Allergies  Allergen Reactions  . Byetta 10 Mcg Pen [Exenatide] Nausea And Vomiting and Other (See Comments)    Patient states this medication made her lose weight.      Past Medical History: Past Medical History:  Diagnosis Date  . Diabetes mellitus without complication ([place])   . Hypertension   . Kidney failure    stage 3  . Proteinuria      Past Surgical History: Past Surgical History:  Procedure Laterality Date  . RENAL BIOPSY    . TOTAL HIP ARTHROPLASTY Right      Family History: History reviewed. No pertinent family history.   Social History: Social History   Social History  . Marital status: Single    Spouse name: N/A  . Number of children: N/A  . Years of education: N/A   Occupational History  . Not on file.   Social History Main Topics  . Smoking status: Never Smoker  . Smokeless tobacco: Never Used  . Alcohol use No  . Drug use: Unknown  . Sexual activity: Not on file   Other Topics Concern  . Not on file   Social History Narrative  . No narrative on file  Review of Systems: Review of Systems  Constitutional: Negative for chills, fever and weight loss.  HENT: Negative for hearing loss and tinnitus.   Eyes: Negative for blurred vision and double vision.  Respiratory: Negative for cough, hemoptysis and sputum production.   Cardiovascular: Negative for chest pain, palpitations and orthopnea.  Gastrointestinal: Positive for abdominal pain. Negative for heartburn, nausea and vomiting.  Genitourinary: Positive for hematuria.  Musculoskeletal: Negative for myalgias and neck pain.  Skin: Negative for itching and rash.  Neurological: Negative for dizziness, focal weakness and headaches.  Endo/Heme/Allergies: Negative for polydipsia. Does not bruise/bleed easily.  Psychiatric/Behavioral: Positive for hallucinations. Negative for depression.     Vital Signs: Blood pressure (!) 172/67, pulse 75, temperature 97.9 F (36.6 C), temperature source Oral, resp. rate 18, height 5\' 6"  (1.676 m), weight 99.8 kg (220 lb 1.6 oz), SpO2 98 %.  Weight trends: Filed Weights   07/02/16 0824 07/03/16 0500  Weight: 100.7 kg (222 lb) 99.8 kg (220 lb 1.6 oz)    Physical Exam: General: NAD, sitting up in bed  Head: Normocephalic, atraumatic.  Eyes: Anicteric, EOMI  Nose: Mucous membranes moist, not inflammed, nonerythematous.  Throat: Oropharynx nonerythematous, no exudate appreciated.   Neck: Supple, trachea midline.  Lungs:  Normal respiratory effort. Clear to auscultation BL without crackles or wheezes.  Heart: RRR. S1 and S2 normal without gallop, murmur, or rubs.  Abdomen:  BS normoactive. Soft, Nondistended, non-tender.  No masses or organomegaly.  Extremities: No pretibial edema.  Neurologic: A&O X3, Motor strength is 5/5 in the all 4 extremities  Skin: No visible rashes, scars.    Lab results: Basic Metabolic Panel:  Recent Labs Lab 07/02/16 0831 07/03/16 0538  NA 140 142  K 3.8 3.8  CL 106 110  CO2 26 25  GLUCOSE 145* 99  BUN 39*  29*  CREATININE 2.37* 1.97*  CALCIUM 9.8 8.9    Liver Function Tests:  Recent Labs Lab 07/02/16 0831 07/03/16 0538  AST 20 14*  ALT 14 10*  ALKPHOS 69 51  BILITOT 0.6 0.8  PROT 7.3 5.8*  ALBUMIN 4.2 3.0*   No results for input(s): LIPASE, AMYLASE in the last 168 hours. No results for input(s): AMMONIA in the last 168 hours.  CBC:  Recent Labs Lab 07/02/16 0831 07/03/16 0538  WBC 17.4* 10.9  NEUTROABS 14.8*  --   HGB 13.8 12.5  HCT 40.4 37.0  MCV 87.9 88.8  PLT 224 195    Cardiac Enzymes: No results for input(s): CKTOTAL, CKMB, CKMBINDEX, TROPONINI in the last 168 hours.  BNP: Invalid input(s): POCBNP  CBG:  Recent Labs Lab 07/02/16 1635 07/02/16 2117 07/03/16 0727 07/03/16 1135  GLUCAP 119* 148* 69 109*    Microbiology: Results for orders placed or performed during the hospital encounter of 07/02/16  Culture, blood (routine x 2)     Status: None (Preliminary result)   Collection Time: 07/02/16 11:50 AM  Result Value Ref Range Status   Specimen Description BLOOD RIGHT ANTECUBITAL  Final   Special Requests BOTTLES DRAWN AEROBIC AND ANAEROBIC  1CC  Final   Culture NO GROWTH < 24 HOURS  Final   Report Status PENDING  Incomplete  Culture, blood (routine x 2)     Status: None (Preliminary result)   Collection Time: 07/02/16 11:50 AM  Result Value Ref Range Status   Specimen Description BLOOD LEFT ANTECUBITAL  Final   Special Requests BOTTLES DRAWN AEROBIC AND ANAEROBIC  1CC  Final   Culture NO GROWTH <  24 HOURS  Final   Report Status PENDING  Incomplete    Coagulation Studies: No results for input(s): LABPROT, INR in the last 72 hours.  Urinalysis:  Recent Labs  07/02/16 0948  COLORURINE STRAW*  LABSPEC 1.006  PHURINE 6.0  GLUCOSEU NEGATIVE  HGBUR 3+*  BILIRUBINUR NEGATIVE  KETONESUR NEGATIVE  PROTEINUR 100*  NITRITE NEGATIVE  LEUKOCYTESUR 2+*      Imaging: US Renal  Result Date: 07/03/2016 CLINICAL DATA:  Acute renal failure  EXAM: RENAL / URINARY TRACT ULTRASOUND COMPLETE COMPARISON:  None. FINDINGS: Right Kidney: Length: 9.1 cm. Mild cortical thinning. 7 mm cyst in the midpole. Mildly increased echotexture throughout the right kidney. No hydronephrosis. Left Kidney: Length: 9.0 cm. Echogenicity within normal limits. No mass or hydronephrosis visualized. Bladder: Appears normal for degree of bladder distention. IMPRESSION: Mild cortical thinning and increased echotexture on the right. No acute findings. No hydronephrosis. Electronically Signed   By: Rolm Baptise M.D.   On: 07/03/2016 11:02     Assessment & Plan: Pt is a 80 y.o. female with a PMHx of diabetes mellitus type 2, hypertension, chronic kidney disease stage III, proteinuria, who was admitted to [facility] on 07/02/2016 for evaluation of hematuria.  History is obtained from the patient as well as chart review and discussion with her outpatient nephrologist.  The patient underwent renal biopsy on 05/22/16 for increasing proteinuria.  Her baseline creatinine ranges between 2-2.0.  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    1.  CKD stage III. 2.  Proteinuria. 3.  Newly diagnosed immunoglobulin deposition disease. 4.  Hematuria.  Plan:  The patient presents with a very interesting case.  She underwent renal biopsy on 06/21/16.  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  In the past she has had history of MGUS but no frank myeloma.  She has seen hematology at [facility] in the past.  For the newly diagnosed immunoglobulin deposition disease we will check SPEP, UPEP, and serum free light chains.  We may consider consultation with hematology as well.  For her underlying hematuria there was no obvious hematoma noted on renal ultrasound.  There condition in unit at the hospital is down for now and therefore we cannot obtain CT scan until this is back up.  Given the fact that  hemoglobin is stable this is nonurgent however.  I have discussed the case with the patient's outpatient nephrologist as well.  We will continue to monitor the patient's progress for now.

## 2016-07-03 NOTE — Care Management Important Message (Signed)
Important Message  Patient Details  Name: Tanny Mitcheltree MRN: [id number] Date of Birth: 1936/11/10   Medicare Important Message Given:  Yes    Bill Yohn A, RN 07/03/2016, 7:44 AM

## 2016-07-04 DIAGNOSIS — I10 Essential (primary) hypertension: Secondary | ICD-10-CM

## 2016-07-04 DIAGNOSIS — B9620 Unspecified Escherichia coli [E. coli] as the cause of diseases classified elsewhere: Secondary | ICD-10-CM

## 2016-07-04 DIAGNOSIS — N390 Urinary tract infection, site not specified: Secondary | ICD-10-CM

## 2016-07-04 DIAGNOSIS — D72829 Elevated white blood cell count, unspecified: Secondary | ICD-10-CM

## 2016-07-04 LAB — PROTEIN ELECTROPHORESIS, SERUM
A/G Ratio: 1.1 (ref 0.7–1.7)
ALPHA-1-GLOBULIN: 0.2 g/dL (ref 0.0–0.4)
ALPHA-2-GLOBULIN: 1 g/dL (ref 0.4–1.0)
Albumin ELP: 2.7 g/dL — ABNORMAL LOW (ref 2.9–4.4)
BETA GLOBULIN: 0.7 g/dL (ref 0.7–1.3)
GAMMA GLOBULIN: 0.6 g/dL (ref 0.4–1.8)
Globulin, Total: 2.5 g/dL (ref 2.2–3.9)
M-SPIKE, %: 0.1 g/dL — AB
Total Protein ELP: 5.2 g/dL — ABNORMAL LOW (ref 6.0–8.5)

## 2016-07-04 LAB — GLUCOSE, CAPILLARY
GLUCOSE-CAPILLARY: 86 mg/dL (ref 65–99)
GLUCOSE-CAPILLARY: 124 mg/dL — AB (ref 65–99)

## 2016-07-04 LAB — BASIC METABOLIC PANEL
Anion gap: 9 (ref 5–15)
BUN: 27 mg/dL — AB (ref 6–20)
CALCIUM: 8.8 mg/dL — AB (ref 8.9–10.3)
CHLORIDE: 110 mmol/L (ref 101–111)
CO2: 25 mmol/L (ref 22–32)
CREATININE: 1.98 mg/dL — AB (ref 0.44–1.00)
GFR calc Af Amer: 27 mL/min — ABNORMAL LOW (ref >60)
GFR calc non Af Amer: 23 mL/min — ABNORMAL LOW (ref >60)
GLUCOSE: 96 mg/dL (ref 65–99)
Potassium: 3.6 mmol/L (ref 3.5–5.1)
Sodium: 144 mmol/L (ref 135–145)

## 2016-07-04 LAB — HEMOGLOBIN: Hemoglobin: 12.2 g/dL (ref 12.0–16.0)

## 2016-07-04 LAB — KAPPA/LAMBDA LIGHT CHAINS
KAPPA FREE LGHT CHN: 273.3 mg/L — AB (ref 3.3–19.4)
Kappa, lambda light chain ratio: 11.73 — ABNORMAL HIGH (ref 0.26–1.65)
LAMDA FREE LIGHT CHAINS: 23.3 mg/L (ref 5.7–26.3)

## 2016-07-04 LAB — URINE CULTURE: Culture: >=100000 — AB

## 2016-07-04 MED ORDER — DOCUSATE SODIUM 100 MG PO CAPS: 100.0000 mg | ORAL_CAPSULE | Freq: Two times a day (BID) | ORAL | 0 refills | Status: DC

## 2016-07-04 MED ORDER — AMLODIPINE BESYLATE 5 MG PO TABS: 5.0000 mg | ORAL_TABLET | ORAL | 0 refills | Status: DC

## 2016-07-04 MED ORDER — CEPHALEXIN 500 MG PO CAPS: 500.0000 mg | ORAL_CAPSULE | Freq: Three times a day (TID) | ORAL | Status: DC

## 2016-07-04 MED ORDER — CEPHALEXIN 500 MG PO CAPS: 500.0000 mg | ORAL_CAPSULE | Freq: Three times a day (TID) | ORAL | 0 refills | Status: DC

## 2016-07-04 NOTE — Progress Notes (Signed)
Patient discharged to home. Home health set up, Dr. Ether Griffins notified MD about need for home health. IV sites removed. Concerns addressed. Patient discharge summary given to patient

## 2016-07-04 NOTE — Discharge Summary (Signed)
[place] [place] NAME: Christina Mercado    MR#:  [id number]  DATE OF BIRTH:  Mar 08, 1936  DATE OF ADMISSION:  07/02/2016 ADMITTING PHYSICIAN: Idelle Crouch, MD  DATE OF DISCHARGE: No discharge date for patient encounter.  PRIMARY CARE PHYSICIAN: Komives, Laqueta Due, MD     ADMISSION DIAGNOSIS:  Dysuria [R30.0] Renal failure [N19] UTI (lower urinary tract infection) [N39.0] Hematuria [R31.9]  DISCHARGE DIAGNOSIS:  Principal Problem:   Pyelonephritis Active Problems:   Acute on chronic renal failure (HCC)   E-coli UTI   Hematuria   Abdominal pain   Leukocytosis   Essential hypertension, malignant   SECONDARY DIAGNOSIS:   Past Medical History:  Diagnosis Date  . Diabetes mellitus without complication ([place])   . Hypertension   . Kidney failure    stage 3  . Proteinuria     .pro HOSPITAL COURSE:  Patient is 80 year old Caucasian female with past history significant for history of diabetes mellitus type 2, essential hypertension, CK D stage III, proteinuria, who was admitted on 23rd of July 2017 for hematuria. Heparin. 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 On arrival to the hospital. Patient's labs revealed creatinine level of 2.37, glucose 145, white blood cell count 17.4 with left shift. Urinalysis revealed too numerous to count white blood cells too numerous to count red blood cells. Blood cultures taken on admission were negative, urine cultures showed 100,000 colony-forming units of Escherichia coli resistant to ampicillin and intermediately sensitive to ampicillin, sulbactam resistant to ciprofloxacin, sensitive to all other antibiotics. Patient was initiated on Rocephin. On arrival to the hospital with improvement of her condition. When sensitivities came back, patient was changed to Keflex orally, to be continued for 7  days to complete course. Patient was to be evaluated by physical therapist, however, family was not too fond about home health follow-up .  Discussion by problem:  #1. Acute on chronic renal failure, creatinine has improved from 2.37-1.98 on the day of discharge on IV fluid administration,  renal ultrasound revealed increased echotexture on the right, but no acute findings, no hydronephrosis, no hematoma, no CT scan was performed, however, patient's hemoglobin level remained stable.  #2.  acute pyelonephritis, due to Escherichia coli, continue Keflex per culture results for 6 more days to complete 7 day course, blood cultures were negative  #3. Leukocytosis, resolved with antibiotic therapy #4, Hematuria,  Hemoglobin level remained stable. Renal ultrasound showed no hematoma. Hematuria was felt to be due to urinary tract infection #5 left lower extremity  pain on palpation, Doppler ultrasound showed no DVT  #6. Newly diagnosed immunoglobulin deposition disease, patient was seen by [facility] hematology in the past, patient is going to be referred back to primary care physician for him to decide hematology/oncology follow-up. Patient is to follow-up with primary nephrologist as outpatient as well, as previously scheduled.  DISCHARGE CONDITIONS:   Stable  CONSULTS OBTAINED:  Treatment Team:  Murlean Iba, MD  DRUG ALLERGIES:   Allergies  Allergen Reactions  . Byetta 10 Mcg Pen [Exenatide] Nausea And Vomiting and Other (See Comments)    Patient states this medication made her lose weight.    DISCHARGE MEDICATIONS:   Current Discharge Medication List    START taking these medications   Details  cephALEXin (KEFLEX) 500 MG capsule Take 1 capsule (500 mg total) by mouth every 8 (eight) hours. Qty: 21 capsule, Refills:  0    docusate sodium (COLACE) 100 MG capsule Take 1 capsule (100 mg total) by mouth 2 (two) times daily. Qty: 10 capsule, Refills: 0      CONTINUE  these medications which have CHANGED   Details  amLODipine (NORVASC) 5 MG tablet Take 1 tablet (5 mg total) by mouth every morning. Qty: 30 tablet, Refills: 0      CONTINUE these medications which have NOT CHANGED   Details  ASPIRIN LOW DOSE 81 MG EC tablet Take 81 mg by mouth every morning.  Refills: 3    Cholecalciferol (VITAMIN D3) 1000 units CAPS Take 2,000 Units by mouth every morning.    ferrous sulfate 325 (65 FE) MG tablet Take 325 mg by mouth daily with breakfast.    gabapentin (NEURONTIN) 100 MG capsule Take 100 mg by mouth at bedtime.    hydroxychloroquine (PLAQUENIL) 200 MG tablet Take 200 mg by mouth every morning.     levothyroxine (SYNTHROID, LEVOTHROID) 137 MCG tablet Take 137 mcg by mouth daily before breakfast.    Oxycodone HCl 10 MG TABS Take 10 mg by mouth every 6 (six) hours as needed for pain. Refills: 0    predniSONE (DELTASONE) 5 MG tablet Take 5 mg by mouth every morning.     vitamin B-12 (CYANOCOBALAMIN) 1000 MCG tablet Take 1,000 mcg by mouth every morning.         DISCHARGE INSTRUCTIONS:    Patient is to follow-up with primary care physician, nephrologist, arrange oncology/hematology follow-up as outpatient  If you experience worsening of your admission symptoms, develop shortness of breath, life threatening emergency, suicidal or homicidal thoughts you must seek medical attention immediately by calling 911 or calling your MD immediately  if symptoms less severe.  You Must read complete instructions/literature along with all the possible adverse reactions/side effects for all the Medicines you take and that have been prescribed to you. Take any new Medicines after you have completely understood and accept all the possible adverse reactions/side effects.   Please note  You were cared for by a hospitalist during your hospital stay. If you have any questions about your discharge medications or the care you received while you were in the hospital  after you are discharged, you can call the unit and asked to speak with the hospitalist on call if the hospitalist that took care of you is not available. Once you are discharged, your primary care physician will handle any further medical issues. Please note that NO REFILLS for any discharge medications will be authorized once you are discharged, as it is imperative that you return to your primary care physician (or establish a relationship with a primary care physician if you do not have one) for your aftercare needs so that they can reassess your need for medications and monitor your lab values.    Today   CHIEF COMPLAINT:   Chief Complaint  Patient presents with  . Hematuria    HISTORY OF PRESENT ILLNESS:  Christina Mercado  is a 80 y.o. female with a known history of diabetes mellitus type 2, essential hypertension, CK D stage III, proteinuria, who was admitted on 23rd of July 2017 for hematuria. Heparin. 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 On arrival to the hospital. Patient's labs revealed creatinine level of 2.37, glucose 145, white blood cell count 17.4 with left shift. Urinalysis revealed too numerous to count white blood cells too numerous to count red blood  cells. Blood cultures taken on admission were negative, urine cultures showed 100,000 colony-forming units of Escherichia coli resistant to ampicillin and intermediately sensitive to ampicillin, sulbactam resistant to ciprofloxacin, sensitive to all other antibiotics. Patient was initiated on Rocephin. On arrival to the hospital with improvement of her condition. When sensitivities came back, patient was changed to Keflex orally, to be continued for 7 days to complete course. Patient was to be evaluated by physical therapist, however, family was not too fond about home health follow-up .  Discussion by problem:  #1. Acute on chronic renal  failure, creatinine has improved from 2.37-1.98 on the day of discharge on IV fluid administration,  renal ultrasound revealed increased echotexture on the right, but no acute findings, no hydronephrosis, no hematoma, no CT scan was performed, however, patient's hemoglobin level remained stable.  #2.  acute pyelonephritis, due to Escherichia coli, continue Keflex per culture results for 6 more days to complete 7 day course, blood cultures were negative  #3. Leukocytosis, resolved with antibiotic therapy #4, Hematuria,  Hemoglobin level remained stable. Renal ultrasound showed no hematoma. Hematuria was felt to be due to urinary tract infection #5 left lower extremity  pain on palpation, Doppler ultrasound showed no DVT  #6. Newly diagnosed immunoglobulin deposition disease, patient was seen by [facility] hematology in the past, patient is going to be referred back to primary care physician for him to decide hematology/oncology follow-up. Patient is to follow-up with primary nephrologist as outpatient as well, as previously scheduled.     VITAL SIGNS:  Blood pressure (!) 159/68, pulse (!) 59, temperature 97.9 F (36.6 C), temperature source Oral, resp. rate 16, height 5\' 6"  (1.676 m), weight 102.2 kg (225 lb 4.8 oz), SpO2 96 %.  I/O:   Intake/Output Summary (Last 24 hours) at 07/04/16 1114 Last data filed at 07/04/16 0900  Gross per 24 hour  Intake          3895.35 ml  Output             2725 ml  Net          1170.35 ml    PHYSICAL EXAMINATION:  GENERAL:  80 y.o.-year-old patient lying in the bed with no acute distress.  EYES: Pupils equal, round, reactive to light and accommodation. No scleral icterus. Extraocular muscles intact.  HEENT: Head atraumatic, normocephalic. Oropharynx and nasopharynx clear.  NECK:  Supple, no jugular venous distention. No thyroid enlargement, no tenderness.  LUNGS: Normal breath sounds bilaterally, no wheezing, rales,rhonchi or crepitation.  No use of accessory muscles of respiration.  CARDIOVASCULAR: S1, S2 normal. No murmurs, rubs, or gallops.  ABDOMEN: Soft, non-tender, non-distended. Bowel sounds present. No organomegaly or mass.  EXTREMITIES: No pedal edema, cyanosis, or clubbing.  NEUROLOGIC: Cranial nerves II through XII are intact. Muscle strength 5/5 in all extremities. Sensation intact. Gait not checked.  PSYCHIATRIC: The patient is alert and oriented x 3.  SKIN: No obvious rash, lesion, or ulcer.   DATA REVIEW:   CBC  Recent Labs Lab 07/03/16 0538 07/04/16 0420  WBC 10.9  --   HGB 12.5 12.2  HCT 37.0  --   PLT 195  --     Chemistries   Recent Labs Lab 07/03/16 0538 07/04/16 0420  NA 142 144  K 3.8 3.6  CL 110 110  CO2 25 25  GLUCOSE 99 96  BUN 29* 27*  CREATININE 1.97* 1.98*  CALCIUM 8.9 8.8*  AST 14*  --   ALT 10*  --  ALKPHOS 51  --   BILITOT 0.8  --     Cardiac Enzymes No results for input(s): TROPONINI in the last 168 hours.  Microbiology Results  Results for orders placed or performed during the hospital encounter of 07/02/16  Urine culture     Status: Abnormal   Collection Time: 07/02/16  9:48 AM  Result Value Ref Range Status   Specimen Description URINE, RANDOM  Final   Special Requests NONE  Final   Culture >=100,000 COLONIES/mL ESCHERICHIA COLI (A)  Final   Report Status 07/04/2016 FINAL  Final   Organism ID, Bacteria ESCHERICHIA COLI (A)  Final      Susceptibility   Escherichia coli - MIC*    AMPICILLIN >=32 RESISTANT Resistant     CEFAZOLIN <=4 SENSITIVE Sensitive     CEFTRIAXONE <=1 SENSITIVE Sensitive     CIPROFLOXACIN >=4 RESISTANT Resistant     GENTAMICIN <=1 SENSITIVE Sensitive     IMIPENEM <=0.25 SENSITIVE Sensitive     NITROFURANTOIN <=16 SENSITIVE Sensitive     TRIMETH/SULFA <=20 SENSITIVE Sensitive     AMPICILLIN/SULBACTAM 16 INTERMEDIATE Intermediate     PIP/TAZO <=4 SENSITIVE Sensitive     Extended ESBL NEGATIVE Sensitive     * >=100,000 COLONIES/mL  ESCHERICHIA COLI  Culture, blood (routine x 2)     Status: None (Preliminary result)   Collection Time: 07/02/16 11:50 AM  Result Value Ref Range Status   Specimen Description BLOOD RIGHT ANTECUBITAL  Final   Special Requests BOTTLES DRAWN AEROBIC AND ANAEROBIC  1CC  Final   Culture NO GROWTH 2 DAYS  Final   Report Status PENDING  Incomplete  Culture, blood (routine x 2)     Status: None (Preliminary result)   Collection Time: 07/02/16 11:50 AM  Result Value Ref Range Status   Specimen Description BLOOD LEFT ANTECUBITAL  Final   Special Requests BOTTLES DRAWN AEROBIC AND ANAEROBIC  1CC  Final   Culture NO GROWTH 2 DAYS  Final   Report Status PENDING  Incomplete    RADIOLOGY:  US Renal  Result Date: 07/03/2016 CLINICAL DATA:  Acute renal failure EXAM: RENAL / URINARY TRACT ULTRASOUND COMPLETE COMPARISON:  None. FINDINGS: Right Kidney: Length: 9.1 cm. Mild cortical thinning. 7 mm cyst in the midpole. Mildly increased echotexture throughout the right kidney. No hydronephrosis. Left Kidney: Length: 9.0 cm. Echogenicity within normal limits. No mass or hydronephrosis visualized. Bladder: Appears normal for degree of bladder distention. IMPRESSION: Mild cortical thinning and increased echotexture on the right. No acute findings. No hydronephrosis. Electronically Signed   By: Rolm Baptise M.D.   On: 07/03/2016 11:02  US Venous Img Lower Unilateral Left  Result Date: 07/03/2016 CLINICAL DATA:  80 year old female with a history of swelling EXAM: LEFT LOWER EXTREMITY VENOUS DOPPLER ULTRASOUND TECHNIQUE: Gray-scale sonography with graded compression, as well as color Doppler and duplex ultrasound were performed to evaluate the lower extremity deep venous systems from the level of the common femoral vein and including the common femoral, femoral, profunda femoral, popliteal and calf veins including the posterior tibial, peroneal and gastrocnemius veins when visible. The superficial great saphenous vein  was also interrogated. Spectral Doppler was utilized to evaluate flow at rest and with distal augmentation maneuvers in the common femoral, femoral and popliteal veins. COMPARISON:  None. FINDINGS: Contralateral Common Femoral Vein: Respiratory phasicity is normal and symmetric with the symptomatic side. No evidence of thrombus. Normal compressibility. Common Femoral Vein: No evidence of thrombus. Normal compressibility, respiratory phasicity and response to  augmentation. Saphenofemoral Junction: No evidence of thrombus. Normal compressibility and flow on color Doppler imaging. Profunda Femoral Vein: No evidence of thrombus. Normal compressibility and flow on color Doppler imaging. Femoral Vein: No evidence of thrombus. Normal compressibility, respiratory phasicity and response to augmentation. Popliteal Vein: No evidence of thrombus. Normal compressibility, respiratory phasicity and response to augmentation. Calf Veins: No evidence of thrombus. Normal compressibility and flow on color Doppler imaging. Superficial Great Saphenous Vein: No evidence of thrombus. Normal compressibility and flow on color Doppler imaging. Other Findings:  None. IMPRESSION: Sonographic survey left lower extremity negative for DVT. Signed, Dulcy Fanny. Earleen Newport, DO [facility] Electronically Signed   By: Corrie Mckusick D.O.   On: 07/03/2016 14:52   EKG:  No orders found for this or any previous visit.    Management plans discussed with the patient, family and they are in agreement.  CODE STATUS:     Code Status Orders        Start     Ordered   07/02/16 1421  Full code  Continuous     07/02/16 1420    Code Status History    Date Active Date Inactive Code Status Order ID Comments User Context   This patient has a current code status but no historical code status.    Advance Directive Documentation   Flowsheet Row Most Recent Value  Type of Advance Directive   Healthcare Power of Attorney, Living will  Pre-existing out of facility DNR order (yellow form or pink MOST form)  No data  "MOST" Form in Place?  No data      TOTAL TIME TAKING CARE OF THIS PATIENT: 40  minutes.    Theodoro Grist M.D on 07/04/2016 at 11:14 AM  Between 7am to 6pm - Pager - [phone]  After 6pm go to [email or web address] - password [email or web address] [facility]  Office  [phone]  CC: Primary care physician; Ballard Russell, MD

## 2016-07-04 NOTE — Care Management Note (Signed)
Case Management Note  Patient Details  Name: Lindzy Moots MRN: [id number] Date of Birth: [birth year]  Subjective/Objective:     A referral for home health PT was texted to [facility] and acknowledged by him. Son chose [facility] from the list of providers he was provided with earlier today.                Action/Plan:   Expected Discharge Date:                  Expected Discharge Plan:     In-House Referral:     Discharge planning Services     Post Acute Care Choice:    Choice offered to:     DME Arranged:    DME Agency:     HH Arranged:    HH Agency:     Status of Service:     If discussed at [employer] of Stay Meetings, dates discussed:    Additional Comments:  Greyson Peavy A, RN 07/04/2016, 3:12 PM

## 2016-07-04 NOTE — Evaluation (Signed)
Physical Therapy Evaluation Patient Details Name: Christina Mercado MRN: [id number] DOB: December 02, 1936 Today's Date: 07/04/2016   History of Present Illness  Pt is a 80 y.o. female presenting to hospital secondary to hematuria and abdominal pain.  Pt admitted with acute on chronic renal failure.  L LE [place] negative for DVT.  PMH includes kidney disease, htn, DM, R THA, proteinuria, and s/p renal biopsy 06/28/16.  Clinical Impression  Prior to admission, pt was modified independent with functional mobility (uses no AD in apt usually but otherwise uses rollator or quad cane).  Pt lives alone on 3rd floor of senior housing with elevator access.  Currently pt is modified independent with transfers with RW and SBA to CGA ambulating around nursing loop with RW (pt required RW d/t unsteady without use of RW).  Pt scored 24/28 on Tinetti balance assessment indicating pt is at low risk for falls (with use of walker).  Pt would benefit from skilled PT to address noted impairments and functional limitations.  Recommend pt discharge to home with HHPT when medically appropriate.    Follow Up Recommendations Home health PT    Equipment Recommendations   (pt owns rollator for home use)    Recommendations for Other Services       Precautions / Restrictions Precautions Precautions: Fall Restrictions Weight Bearing Restrictions: No      Mobility  Bed Mobility               General bed mobility comments: Deferred d/t pt sitting up in chair beginning of session and sitting in chair end of session.  Transfers Overall transfer level: Modified independent Equipment used: Rolling walker (2 wheeled)             General transfer comment: steady with use of RW; mildly unsteady upon standing without AD use (but no overt loss of balance requiring assist to steady)  Ambulation/Gait Ambulation/Gait assistance: Supervision;Min guard Ambulation Distance (Feet): 220 Feet Assistive device: Rolling walker (2  wheeled) Gait Pattern/deviations: Step-through pattern Gait velocity: mildly decreased   General Gait Details: pt unsteady without AD requiring CGA to min assist to steady (and pt reaching for items in room to hold onto); switched to RW and pt steady without loss of balance  Stairs Stairs:  (deferred d/t pt does not perform stairs at home)          Wheelchair Mobility    Modified Rankin (Stroke Patients Only)       Balance Overall balance assessment: Needs assistance Sitting-balance support: Feet supported;No upper extremity supported Sitting balance-Leahy Scale: Normal     Standing balance support: Bilateral upper extremity supported;During functional activity (UE support on RW) Standing balance-Leahy Scale: Good Standing balance comment: no loss of balance ambulating with RW with head turns R/L/up/down or increasing/decreasing speed                 Standardized Balance Assessment Standardized Balance Assessment :  (Tinetti balance assessment pt scored:  24/28 (1/2 for rises from chair; standing balance 1/2; 0/1 for turning 360 degrees; 1/2 for path (uses aid))           Pertinent Vitals/Pain Pain Assessment: No/denies pain  Vitals stable and WFL throughout treatment session (HR and O2)    Home Living Family/patient expects to be discharged to:: Private residence Living Arrangements: Alone Available Help at Discharge: Family Type of Home: Apartment Home Access: Elevator     Home Layout: One level Home Equipment: Environmental consultant - 4 wheels;Cane - quad Additional Comments: Pt  lives on 3rd floor of senior apt housing.    Prior Function Level of Independence: Independent with assistive device(s)         Comments: Pt uses no AD in apt in general but uses rollator in general around the complex (to go to meals or at night to go to bathroom) and uses quad cane outdoors to go to appts.  Pt denies any falls in past 6 months.  Pt gets assist for meals.     Hand  Dominance        Extremity/Trunk Assessment   Upper Extremity Assessment: Generalized weakness           Lower Extremity Assessment: Generalized weakness         Communication   Communication: HOH  Cognition Arousal/Alertness: Awake/alert Behavior During Therapy: WFL for tasks assessed/performed Overall Cognitive Status: Within Functional Limits for tasks assessed                      General Comments General comments (skin integrity, edema, etc.): Pt sitting in chair with street clothes on and son present ready to discharge home.    Exercises  Balance activities      Assessment/Plan    PT Assessment Patient needs continued PT services  PT Diagnosis Difficulty walking;Generalized weakness   PT Problem List Decreased strength;Decreased balance  PT Treatment Interventions DME instruction;Gait training;Functional mobility training;Therapeutic activities;Therapeutic exercise;Balance training;Patient/family education   PT Goals (Current goals can be found in the Care Plan section) Acute Rehab PT Goals Patient Stated Goal: to go home PT Goal Formulation: With patient/family Time For Goal Achievement: 07/18/16 Potential to Achieve Goals: Good    Frequency Min 2X/week   Barriers to discharge        Co-evaluation               End of Session Equipment Utilized During Treatment: Gait belt Activity Tolerance: Patient tolerated treatment well Patient left: in chair;with call bell/phone within reach;with family/visitor present Nurse Communication: Mobility status         Time: 1351-1419 PT Time Calculation (min) (ACUTE ONLY): 28 min   Charges:   PT Evaluation $PT Eval Low Complexity: 1 Procedure PT Treatments $Therapeutic Activity: 8-22 mins   PT G CodesLeitha Bleak 26-Jul-2016, 4:22 PM Leitha Bleak, [place]

## 2016-07-04 NOTE — Progress Notes (Signed)
[facility]  ROUNDING NOTE   Subjective:  Patient denies any further hematuria. 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 Hemoglobin appears to be stable. Patient already follows with hematology at [facility]. Advise patient to continue to follow with her outpatient nephrologist and hematologist given the newly diagnosed him to deposition disease.   Objective:  Vital signs in last 24 hours:  Temp:  [97.9 F (36.6 C)-98.4 F (36.9 C)] 97.9 F (36.6 C) (07/25 0444) Pulse Rate:  [57-75] 59 (07/25 0618) Resp:  [16-18] 16 (07/25 0444) BP: (159-187)/(63-92) 159/68 (07/25 0618) SpO2:  [96 %-99 %] 96 % (07/25 0444) Weight:  [102.2 kg (225 lb 4.8 oz)] 102.2 kg (225 lb 4.8 oz) (07/25 0500)  Weight change: 1.497 kg (3 lb 4.8 oz) Filed Weights   07/02/16 0824 07/03/16 0500 07/04/16 0500  Weight: 100.7 kg (222 lb) 99.8 kg (220 lb 1.6 oz) 102.2 kg (225 lb 4.8 oz)    Intake/Output: I/O last 3 completed shifts: In: 5930.4 [P.O.:2220; I.V.:3610.4; IV Piggyback:100] Out: [id number] [Urine:3225]   Intake/Output this shift:  Total I/O In: 660 [P.O.:660] Out: 250 [Urine:250]  Physical Exam: General: No acute distress  Head: Normocephalic, atraumatic. Moist oral mucosal membranes  Eyes: Anicteric  Neck: Supple, trachea midline  Lungs:  Clear to auscultation, normal effort  Heart: S1S2 no rubs  Abdomen:  Soft, nontender,   Extremities: No peripheral edema.  Neurologic: Nonfocal, moving all four extremities  Skin: No lesions  Access:     Basic Metabolic Panel:  Recent Labs Lab 07/02/16 0831 07/03/16 0538 07/04/16 0420  NA 140 142 144  K 3.8 3.8 3.6  CL 106 110 110  CO2 26 25 25   GLUCOSE 145* 99 96  BUN 39* 29* 27*  CREATININE 2.37* 1.97* 1.98*  CALCIUM 9.8 8.9 8.8*    Liver Function Tests:  Recent Labs Lab 07/02/16 0831 07/03/16 0538  AST 20 14*  ALT 14 10*  ALKPHOS 69 51  BILITOT 0.6 0.8  PROT 7.3 5.8*   ALBUMIN 4.2 3.0*   No results for input(s): LIPASE, AMYLASE in the last 168 hours. No results for input(s): AMMONIA in the last 168 hours.  CBC:  Recent Labs Lab 07/02/16 0831 07/03/16 0538 07/04/16 0420  WBC 17.4* 10.9  --   NEUTROABS 14.8*  --   --   HGB 13.8 12.5 12.2  HCT 40.4 37.0  --   MCV 87.9 88.8  --   PLT 224 195  --     Cardiac Enzymes: No results for input(s): CKTOTAL, CKMB, CKMBINDEX, TROPONINI in the last 168 hours.  BNP: Invalid input(s): POCBNP  CBG:  Recent Labs Lab 07/03/16 1135 07/03/16 1636 07/03/16 2106 07/04/16 0722 07/04/16 1127  GLUCAP 109* 117* 133* 66 124*    Microbiology: Results for orders placed or performed during the hospital encounter of 07/02/16  Urine culture     Status: Abnormal   Collection Time: 07/02/16  9:48 AM  Result Value Ref Range Status   Specimen Description URINE, RANDOM  Final   Special Requests NONE  Final   Culture >=100,000 COLONIES/mL ESCHERICHIA COLI (A)  Final   Report Status 07/04/2016 FINAL  Final   Organism ID, Bacteria ESCHERICHIA COLI (A)  Final      Susceptibility   Escherichia coli - MIC*    AMPICILLIN >=32 RESISTANT Resistant     CEFAZOLIN <=4 SENSITIVE Sensitive     CEFTRIAXONE <=1 SENSITIVE Sensitive     CIPROFLOXACIN >=4 RESISTANT  Resistant     GENTAMICIN <=1 SENSITIVE Sensitive     IMIPENEM <=0.25 SENSITIVE Sensitive     NITROFURANTOIN <=16 SENSITIVE Sensitive     TRIMETH/SULFA <=20 SENSITIVE Sensitive     AMPICILLIN/SULBACTAM 16 INTERMEDIATE Intermediate     PIP/TAZO <=4 SENSITIVE Sensitive     Extended ESBL NEGATIVE Sensitive     * >=100,000 COLONIES/mL ESCHERICHIA COLI  Culture, blood (routine x 2)     Status: None (Preliminary result)   Collection Time: 07/02/16 11:50 AM  Result Value Ref Range Status   Specimen Description BLOOD RIGHT ANTECUBITAL  Final   Special Requests BOTTLES DRAWN AEROBIC AND ANAEROBIC  1CC  Final   Culture NO GROWTH 2 DAYS  Final   Report Status PENDING   Incomplete  Culture, blood (routine x 2)     Status: None (Preliminary result)   Collection Time: 07/02/16 11:50 AM  Result Value Ref Range Status   Specimen Description BLOOD LEFT ANTECUBITAL  Final   Special Requests BOTTLES DRAWN AEROBIC AND ANAEROBIC  1CC  Final   Culture NO GROWTH 2 DAYS  Final   Report Status PENDING  Incomplete    Coagulation Studies: No results for input(s): LABPROT, INR in the last 72 hours.  Urinalysis:  Recent Labs  07/02/16 0948  COLORURINE STRAW*  LABSPEC 1.006  PHURINE 6.0  GLUCOSEU NEGATIVE  HGBUR 3+*  BILIRUBINUR NEGATIVE  KETONESUR NEGATIVE  PROTEINUR 100*  NITRITE NEGATIVE  LEUKOCYTESUR 2+*      Imaging: US Renal  Result Date: 07/03/2016 CLINICAL DATA:  Acute renal failure EXAM: RENAL / URINARY TRACT ULTRASOUND COMPLETE COMPARISON:  None. FINDINGS: Right Kidney: Length: 9.1 cm. Mild cortical thinning. 7 mm cyst in the midpole. Mildly increased echotexture throughout the right kidney. No hydronephrosis. Left Kidney: Length: 9.0 cm. Echogenicity within normal limits. No mass or hydronephrosis visualized. Bladder: Appears normal for degree of bladder distention. IMPRESSION: Mild cortical thinning and increased echotexture on the right. No acute findings. No hydronephrosis. Electronically Signed   By: Rolm Baptise M.D.   On: 07/03/2016 11:02  US Venous Img Lower Unilateral Left  Result Date: 07/03/2016 CLINICAL DATA:  80 year old female with a history of swelling EXAM: LEFT LOWER EXTREMITY VENOUS DOPPLER ULTRASOUND TECHNIQUE: Gray-scale sonography with graded compression, as well as color Doppler and duplex ultrasound were performed to evaluate the lower extremity deep venous systems from the level of the common femoral vein and including the common femoral, femoral, profunda femoral, popliteal and calf veins including the posterior tibial, peroneal and gastrocnemius veins when visible. The superficial great saphenous vein was also interrogated.  Spectral Doppler was utilized to evaluate flow at rest and with distal augmentation maneuvers in the common femoral, femoral and popliteal veins. COMPARISON:  None. FINDINGS: Contralateral Common Femoral Vein: Respiratory phasicity is normal and symmetric with the symptomatic side. No evidence of thrombus. Normal compressibility. Common Femoral Vein: No evidence of thrombus. Normal compressibility, respiratory phasicity and response to augmentation. Saphenofemoral Junction: No evidence of thrombus. Normal compressibility and flow on color Doppler imaging. Profunda Femoral Vein: No evidence of thrombus. Normal compressibility and flow on color Doppler imaging. Femoral Vein: No evidence of thrombus. Normal compressibility, respiratory phasicity and response to augmentation. Popliteal Vein: No evidence of thrombus. Normal compressibility, respiratory phasicity and response to augmentation. Calf Veins: No evidence of thrombus. Normal compressibility and flow on color Doppler imaging. Superficial Great Saphenous Vein: No evidence of thrombus. Normal compressibility and flow on color Doppler imaging. Other Findings:  None. IMPRESSION: Sonographic survey  left lower extremity negative for DVT. Signed, Dulcy Fanny. Earleen Newport, DO [facility] Electronically Signed   By: Corrie Mckusick D.O.   On: 07/03/2016 14:52    Medications:     . amLODipine  5 mg Oral BH-q7a  . cephALEXin  500 mg Oral Q8H  . cholecalciferol  2,000 Units Oral BH-q7a  . docusate sodium  100 mg Oral BID  . ferrous sulfate  325 mg Oral Q breakfast  . gabapentin  100 mg Oral QHS  . hydroxychloroquine  200 mg Oral BH-q7a  . insulin aspart  0-9 Units Subcutaneous TID WC  . levothyroxine  137 mcg Oral QAC breakfast  . pantoprazole  40 mg Oral Daily  . predniSONE  5 mg Oral BH-q7a  . vitamin B-12  1,000 mcg Oral BH-q7a   acetaminophen **OR** acetaminophen, bisacodyl, hydrALAZINE, morphine  injection, ondansetron **OR** ondansetron (ZOFRAN) IV, oxyCODONE  Assessment/ Plan:  80 y.o. female with a PMHx of diabetes mellitus type 2, hypertension, chronic kidney disease stage III, proteinuria, who was admitted to [facility] on 07/02/2016 for evaluation of hematuria.  History is obtained from the patient as well as chart review and discussion with her outpatient nephrologist.  The patient underwent renal biopsy on 05/22/16 for increasing proteinuria.  Her baseline creatinine ranges between 2-2.0.  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    1.  CKD stage III. 2.  Proteinuria. 3.  Newly diagnosed immunoglobulin deposition disease. 4.  Hematuria.  Plan:  As before the patient has newly diagnosed in mid level and deposition disease within the kidney.  The patient was made aware of this diagnosis today.  I discussed the care of the patient with her outpatient nephrologist of [facility].  The patient's son was also made aware of the new diagnosis.  At this point in time the family elects to continue to followup with [facility] as well as [facility].  This is certainly appropriate.  However we did stressed importance of followup given the newly diagnosed hemoglobin and deposition disease.  The patient's hematuria appears to have resolved.  This was secondary to the underlying immune globulin deposition disease as well as early use of aspirin post biopsy.  We advised the patient to avoid any NSAIDs including aspirin for the next 7-10 days.  As before she will followup with [facility] as well as nephrology in the relative near future.   LOS: 2 Jerome Otter 7/25/201712:52 PM

## 2016-07-05 LAB — PROTEIN ELECTRO, RANDOM URINE
Albumin ELP, Urine: 68.1 %
Alpha-1-Globulin, U: 7.2 %
Alpha-2-Globulin, U: 6.4 %
BETA GLOBULIN, U: 13 %
GAMMA GLOBULIN, U: 5.4 %
TOTAL PROTEIN, URINE-UPE24: 57.4 mg/dL

## 2016-07-25 DIAGNOSIS — D7589 Other specified diseases of blood and blood-forming organs: Secondary | ICD-10-CM | POA: Insufficient documentation

## 2016-07-26 LAB — CULTURE, BLOOD (ROUTINE X 2)
CULTURE: NO GROWTH
CULTURE: NO GROWTH

## 2016-11-02 DIAGNOSIS — F064 Anxiety disorder due to known physiological condition: Secondary | ICD-10-CM | POA: Insufficient documentation

## 2017-01-31 DIAGNOSIS — N2581 Secondary hyperparathyroidism of renal origin: Secondary | ICD-10-CM | POA: Insufficient documentation

## 2017-06-22 DIAGNOSIS — N2889 Other specified disorders of kidney and ureter: Secondary | ICD-10-CM | POA: Insufficient documentation

## 2017-06-22 DIAGNOSIS — I151 Hypertension secondary to other renal disorders: Secondary | ICD-10-CM | POA: Insufficient documentation

## 2018-06-01 ENCOUNTER — Encounter: Payer: Self-pay | Admitting: Emergency Medicine

## 2018-06-01 ENCOUNTER — Other Ambulatory Visit: Payer: Self-pay

## 2018-06-01 DIAGNOSIS — M25462 Effusion, left knee: Secondary | ICD-10-CM | POA: Insufficient documentation

## 2018-06-01 DIAGNOSIS — M069 Rheumatoid arthritis, unspecified: Secondary | ICD-10-CM | POA: Diagnosis not present

## 2018-06-01 DIAGNOSIS — Z7984 Long term (current) use of oral hypoglycemic drugs: Secondary | ICD-10-CM | POA: Diagnosis not present

## 2018-06-01 DIAGNOSIS — Z96641 Presence of right artificial hip joint: Secondary | ICD-10-CM | POA: Diagnosis not present

## 2018-06-01 DIAGNOSIS — Z79890 Hormone replacement therapy (postmenopausal): Secondary | ICD-10-CM | POA: Insufficient documentation

## 2018-06-01 DIAGNOSIS — Z87440 Personal history of urinary (tract) infections: Secondary | ICD-10-CM | POA: Insufficient documentation

## 2018-06-01 DIAGNOSIS — M25552 Pain in left hip: Principal | ICD-10-CM | POA: Insufficient documentation

## 2018-06-01 DIAGNOSIS — M25452 Effusion, left hip: Secondary | ICD-10-CM | POA: Diagnosis not present

## 2018-06-01 DIAGNOSIS — M1612 Unilateral primary osteoarthritis, left hip: Secondary | ICD-10-CM | POA: Diagnosis not present

## 2018-06-01 DIAGNOSIS — Z888 Allergy status to other drugs, medicaments and biological substances status: Secondary | ICD-10-CM | POA: Diagnosis not present

## 2018-06-01 DIAGNOSIS — E1122 Type 2 diabetes mellitus with diabetic chronic kidney disease: Secondary | ICD-10-CM | POA: Insufficient documentation

## 2018-06-01 DIAGNOSIS — M5136 Other intervertebral disc degeneration, lumbar region: Secondary | ICD-10-CM | POA: Insufficient documentation

## 2018-06-01 DIAGNOSIS — I120 Hypertensive chronic kidney disease with stage 5 chronic kidney disease or end stage renal disease: Secondary | ICD-10-CM | POA: Insufficient documentation

## 2018-06-01 DIAGNOSIS — N185 Chronic kidney disease, stage 5: Secondary | ICD-10-CM | POA: Insufficient documentation

## 2018-06-01 DIAGNOSIS — M533 Sacrococcygeal disorders, not elsewhere classified: Secondary | ICD-10-CM | POA: Insufficient documentation

## 2018-06-01 DIAGNOSIS — M1712 Unilateral primary osteoarthritis, left knee: Secondary | ICD-10-CM | POA: Insufficient documentation

## 2018-06-01 DIAGNOSIS — R935 Abnormal findings on diagnostic imaging of other abdominal regions, including retroperitoneum: Secondary | ICD-10-CM | POA: Insufficient documentation

## 2018-06-01 DIAGNOSIS — M25461 Effusion, right knee: Secondary | ICD-10-CM | POA: Insufficient documentation

## 2018-06-01 DIAGNOSIS — M810 Age-related osteoporosis without current pathological fracture: Secondary | ICD-10-CM | POA: Diagnosis not present

## 2018-06-01 DIAGNOSIS — K381 Appendicular concretions: Secondary | ICD-10-CM | POA: Diagnosis not present

## 2018-06-01 DIAGNOSIS — Z9889 Other specified postprocedural states: Secondary | ICD-10-CM | POA: Insufficient documentation

## 2018-06-01 NOTE — ED Triage Notes (Signed)
Patient came in  EMS from [facility] for left anterior thigh pain. Patient states she was walking yesterday and thinks she pulled something. Took 1g of tylenol @6pm  and oxy @6 :30pm.

## 2018-06-02 ENCOUNTER — Emergency Department: Payer: Medicare Other

## 2018-06-02 ENCOUNTER — Observation Stay
Admission: EM | Admit: 2018-06-02 | Discharge: 2018-06-03 | Disposition: A | Discharge status: Home Health | Payer: Medicare Other | Attending: Internal Medicine | Admitting: Internal Medicine

## 2018-06-02 ENCOUNTER — Inpatient Hospital Stay: Payer: Medicare Other

## 2018-06-02 ENCOUNTER — Encounter: Payer: Self-pay | Admitting: Internal Medicine

## 2018-06-02 DIAGNOSIS — M25562 Pain in left knee: Secondary | ICD-10-CM | POA: Diagnosis present

## 2018-06-02 DIAGNOSIS — R262 Difficulty in walking, not elsewhere classified: Secondary | ICD-10-CM | POA: Diagnosis present

## 2018-06-02 DIAGNOSIS — M25552 Pain in left hip: Secondary | ICD-10-CM | POA: Diagnosis present

## 2018-06-02 DIAGNOSIS — M25559 Pain in unspecified hip: Secondary | ICD-10-CM | POA: Diagnosis present

## 2018-06-02 DIAGNOSIS — N185 Chronic kidney disease, stage 5: Secondary | ICD-10-CM | POA: Diagnosis present

## 2018-06-02 LAB — CBC WITH DIFFERENTIAL/PLATELET
Basophils Absolute: 0.1 10*3/uL (ref 0–0.1)
Basophils Relative: 1 %
EOS ABS: 0 10*3/uL (ref 0–0.7)
EOS PCT: 0 %
HCT: 33.4 % — ABNORMAL LOW (ref 35.0–47.0)
Hemoglobin: 11.3 g/dL — ABNORMAL LOW (ref 12.0–16.0)
LYMPHS ABS: 0.8 10*3/uL — AB (ref 1.0–3.6)
Lymphocytes Relative: 5 %
MCH: 29.5 pg (ref 26.0–34.0)
MCHC: 33.9 g/dL (ref 32.0–36.0)
MCV: 87 fL (ref 80.0–100.0)
Monocytes Absolute: 1.1 10*3/uL — ABNORMAL HIGH (ref 0.2–0.9)
Monocytes Relative: 7 %
Neutro Abs: 14.4 10*3/uL — ABNORMAL HIGH (ref 1.4–6.5)
Neutrophils Relative %: 87 %
PLATELETS: 223 10*3/uL (ref 150–440)
RBC: 3.84 MIL/uL (ref 3.80–5.20)
RDW: 14.9 % — ABNORMAL HIGH (ref 11.5–14.5)
WBC: 16.5 10*3/uL — AB (ref 3.6–11.0)

## 2018-06-02 LAB — COMPREHENSIVE METABOLIC PANEL
ALBUMIN: 3.8 g/dL (ref 3.5–5.0)
ALK PHOS: 73 U/L (ref 38–126)
ALT: 14 U/L (ref 14–54)
AST: 19 U/L (ref 15–41)
Anion gap: 10 (ref 5–15)
BILIRUBIN TOTAL: 0.4 mg/dL (ref 0.3–1.2)
BUN: 63 mg/dL — AB (ref 6–20)
CALCIUM: 9.3 mg/dL (ref 8.9–10.3)
CHLORIDE: 102 mmol/L (ref 101–111)
CO2: 24 mmol/L (ref 22–32)
CREATININE: 3.49 mg/dL — AB (ref 0.44–1.00)
GFR calc Af Amer: 13 mL/min — ABNORMAL LOW (ref >60)
GFR calc non Af Amer: 11 mL/min — ABNORMAL LOW (ref >60)
Glucose, Bld: 200 mg/dL — ABNORMAL HIGH (ref 65–99)
POTASSIUM: 3.7 mmol/L (ref 3.5–5.1)
Sodium: 136 mmol/L (ref 135–145)
TOTAL PROTEIN: 6.9 g/dL (ref 6.5–8.1)

## 2018-06-02 LAB — GLUCOSE, CAPILLARY
GLUCOSE-CAPILLARY: 127 mg/dL — AB (ref 65–99)
Glucose-Capillary: 129 mg/dL — ABNORMAL HIGH (ref 65–99)
Glucose-Capillary: 140 mg/dL — ABNORMAL HIGH (ref 65–99)
Glucose-Capillary: 122 mg/dL — ABNORMAL HIGH (ref 65–99)

## 2018-06-02 LAB — PROTIME-INR
INR: 0.9
Prothrombin Time: 12.1 seconds (ref 11.4–15.2)

## 2018-06-02 LAB — LACTIC ACID, PLASMA: LACTIC ACID, VENOUS: 1.5 mmol/L (ref 0.5–1.9)

## 2018-06-02 LAB — PROCALCITONIN: Procalcitonin: 0.34 ng/mL

## 2018-06-02 LAB — MRSA PCR SCREENING: MRSA BY PCR: NEGATIVE

## 2018-06-02 LAB — SEDIMENTATION RATE: SED RATE: 57 mm/h — AB (ref 0–30)

## 2018-06-02 MED ORDER — AMLODIPINE BESYLATE 5 MG PO TABS
2.5000 mg | ORAL_TABLET | Freq: Every day | ORAL | Status: DC
  Administered 2018-06-02 – 2018-06-03 (×2): 2.5 mg via ORAL
  Filled 2018-06-02 (×2): qty 1

## 2018-06-02 MED ORDER — VITAMIN B-12 1000 MCG PO TABS
1000.0000 ug | ORAL_TABLET | ORAL | Status: DC
  Administered 2018-06-02: 1000 ug via ORAL
  Filled 2018-06-02: qty 1

## 2018-06-02 MED ORDER — ACETAMINOPHEN 650 MG RE SUPP: 650.0000 mg | Freq: Four times a day (QID) | RECTAL | Status: DC | PRN

## 2018-06-02 MED ORDER — SENNOSIDES-DOCUSATE SODIUM 8.6-50 MG PO TABS: 1.0000 | ORAL_TABLET | Freq: Every evening | ORAL | Status: DC | PRN

## 2018-06-02 MED ORDER — HYDROMORPHONE HCL 1 MG/ML IJ SOLN
1.0000 mg | INTRAMUSCULAR | Status: DC | PRN
  Administered 2018-06-02 (×2): 1 mg via INTRAVENOUS
  Filled 2018-06-02 (×3): qty 1

## 2018-06-02 MED ORDER — ONDANSETRON HCL 4 MG/2ML IJ SOLN: 4.0000 mg | Freq: Four times a day (QID) | INTRAMUSCULAR | Status: DC | PRN

## 2018-06-02 MED ORDER — HYDRALAZINE HCL 20 MG/ML IJ SOLN: 10.0000 mg | Freq: Four times a day (QID) | INTRAMUSCULAR | Status: DC | PRN

## 2018-06-02 MED ORDER — FENTANYL CITRATE (PF) 100 MCG/2ML IJ SOLN
75.0000 ug | Freq: Once | INTRAMUSCULAR | Status: AC
Start: 2018-06-02 — End: 2018-06-02
  Administered 2018-06-02: 75 ug via INTRAVENOUS
  Filled 2018-06-02: qty 2

## 2018-06-02 MED ORDER — FERROUS SULFATE 325 (65 FE) MG PO TABS
325.0000 mg | ORAL_TABLET | Freq: Every day | ORAL | Status: DC
  Administered 2018-06-02: 325 mg via ORAL
  Filled 2018-06-02: qty 1

## 2018-06-02 MED ORDER — DIAZEPAM 5 MG PO TABS
ORAL_TABLET | ORAL | Status: AC
  Filled 2018-06-02: qty 1

## 2018-06-02 MED ORDER — SERTRALINE HCL 50 MG PO TABS
100.0000 mg | ORAL_TABLET | Freq: Every day | ORAL | Status: DC
  Administered 2018-06-02 – 2018-06-03 (×2): 100 mg via ORAL
  Filled 2018-06-02 (×2): qty 2

## 2018-06-02 MED ORDER — ACETAMINOPHEN 325 MG PO TABS: 650.0000 mg | ORAL_TABLET | Freq: Four times a day (QID) | ORAL | Status: DC | PRN

## 2018-06-02 MED ORDER — VITAMIN D3 25 MCG (1000 UNIT) PO TABS
2000.0000 [IU] | ORAL_TABLET | ORAL | Status: DC
  Administered 2018-06-02 – 2018-06-03 (×2): 2000 [IU] via ORAL
  Filled 2018-06-02 (×2): qty 2

## 2018-06-02 MED ORDER — INSULIN ASPART 100 UNIT/ML ~~LOC~~ SOLN
0.0000 [IU] | Freq: Three times a day (TID) | SUBCUTANEOUS | Status: DC
  Administered 2018-06-02 (×2): 2 [IU] via SUBCUTANEOUS
  Administered 2018-06-03 (×2): 3 [IU] via SUBCUTANEOUS
  Filled 2018-06-02 (×5): qty 1

## 2018-06-02 MED ORDER — ONDANSETRON HCL 4 MG PO TABS: 4.0000 mg | ORAL_TABLET | Freq: Four times a day (QID) | ORAL | Status: DC | PRN

## 2018-06-02 MED ORDER — DIAZEPAM 5 MG PO TABS
5.0000 mg | ORAL_TABLET | Freq: Once | ORAL | Status: AC
  Administered 2018-06-02: 5 mg via ORAL

## 2018-06-02 MED ORDER — HEPARIN SODIUM (PORCINE) 5000 UNIT/ML IJ SOLN
5000.0000 [IU] | Freq: Three times a day (TID) | INTRAMUSCULAR | Status: DC
  Administered 2018-06-02 (×3): 5000 [IU] via SUBCUTANEOUS
  Filled 2018-06-02 (×3): qty 1

## 2018-06-02 MED ORDER — GABAPENTIN 300 MG PO CAPS
900.0000 mg | ORAL_CAPSULE | Freq: Once | ORAL | Status: DC
  Filled 2018-06-02: qty 3

## 2018-06-02 MED ORDER — LEVOTHYROXINE SODIUM 137 MCG PO TABS
137.0000 ug | ORAL_TABLET | Freq: Every day | ORAL | Status: DC
  Administered 2018-06-02 – 2018-06-03 (×2): 137 ug via ORAL
  Filled 2018-06-02 (×2): qty 1

## 2018-06-02 MED ORDER — DIAZEPAM 5 MG PO TABS: 5.0000 mg | ORAL_TABLET | Freq: Four times a day (QID) | ORAL | Status: DC | PRN

## 2018-06-02 MED ORDER — POLYETHYLENE GLYCOL 3350 17 G PO PACK
17.0000 g | PACK | Freq: Every day | ORAL | Status: DC
  Administered 2018-06-02 – 2018-06-03 (×2): 17 g via ORAL
  Filled 2018-06-02 (×2): qty 1

## 2018-06-02 MED ORDER — METHYLPREDNISOLONE SODIUM SUCC 40 MG IJ SOLR
40.0000 mg | Freq: Four times a day (QID) | INTRAMUSCULAR | Status: AC
  Administered 2018-06-02 – 2018-06-03 (×3): 40 mg via INTRAVENOUS
  Filled 2018-06-02 (×3): qty 1

## 2018-06-02 MED ORDER — MAGNESIUM OXIDE 400 (241.3 MG) MG PO TABS
400.0000 mg | ORAL_TABLET | Freq: Every day | ORAL | Status: DC
  Administered 2018-06-02 – 2018-06-03 (×2): 400 mg via ORAL
  Filled 2018-06-02 (×2): qty 1

## 2018-06-02 MED ORDER — BISACODYL 5 MG PO TBEC: 5.0000 mg | DELAYED_RELEASE_TABLET | Freq: Every day | ORAL | Status: DC | PRN

## 2018-06-02 MED ORDER — ATORVASTATIN CALCIUM 20 MG PO TABS
40.0000 mg | ORAL_TABLET | Freq: Every day | ORAL | Status: DC
  Administered 2018-06-02: 40 mg via ORAL
  Filled 2018-06-02: qty 2

## 2018-06-02 MED ORDER — PREDNISONE 10 MG PO TABS
5.0000 mg | ORAL_TABLET | ORAL | Status: DC
  Administered 2018-06-02: 5 mg via ORAL
  Filled 2018-06-02: qty 1

## 2018-06-02 NOTE — ED Notes (Signed)
md in to speak with pt and family.

## 2018-06-02 NOTE — ED Provider Notes (Signed)
[facility] Emergency Department Provider Note  ____________________________________________   First MD Initiated Contact with Patient 06/02/18 0024     (approximate)  I have reviewed the triage vital signs and the nursing notes.   HISTORY  Chief Complaint Leg Pain    HPI Christina Mercado is a 82 y.o. female comes to the emergency department via EMS with atraumatic pain to her left hip and left knee.  She has a past medical history of rheumatoid arthritis and says she has been "trying to walk more" and over the course of the day her pain in her left knee and left hip have become slowly progressive and are now severe.  She is able to bear weight but with difficulty.  No fevers or chills.  No particular trauma.  The pain is worse with movement improved with rest.  Is not particularly radiating.    Past Medical History:  Diagnosis Date  . Diabetes mellitus without complication ([place])   . Hypertension   . Kidney failure    stage 3  . Proteinuria     Patient Active Problem List   Diagnosis Date Noted  . CKD (chronic kidney disease), stage V ([place]) 06/02/2018  . Acute pain of left hip 06/02/2018  . Acute pain of left knee 06/02/2018  . 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 Leukocytosis 07/04/2016  . E-coli UTI 07/04/2016  . Essential hypertension, malignant 07/04/2016  . Pyelonephritis 07/02/2016  . Hematuria 07/02/2016  . Abdominal pain 07/02/2016  . Acute on chronic renal failure ([place]) 07/02/2016    Past Surgical History:  Procedure Laterality Date  . RENAL BIOPSY    . TOTAL HIP ARTHROPLASTY Right     Prior to Admission medications   Medication Sig Start Date End Date Taking? Authorizing Provider  acetaminophen (TYLENOL) 650 MG CR tablet Take 650 mg by mouth every 8 (eight) hours as needed for pain.   Yes [provider]  amLODipine (NORVASC) 5 MG tablet Take 1 tablet (5 mg total) by mouth every morning. Patient  taking differently: Take 2.5 mg by mouth daily.  07/04/16  Yes Theodoro Grist, MD  atorvastatin (LIPITOR) 40 MG tablet Take 40 mg by mouth daily at 6 PM.   Yes [provider]  Cholecalciferol (VITAMIN D3) 1000 units CAPS Take 2,000 Units by mouth every morning.   Yes [provider]  ferrous sulfate 325 (65 FE) MG tablet Take 325 mg by mouth daily with breakfast.   Yes [provider]  glimepiride (AMARYL) 1 MG tablet Take 1 mg by mouth daily with breakfast.   Yes [provider]  Levothyroxine Sodium 150 MCG CAPS Take 137 mcg by mouth daily before breakfast.  06/17/16  Yes [provider]  magnesium oxide (MAG-OX) 400 MG tablet Take 400 mg by mouth daily.   Yes [provider]  Oxycodone HCl 10 MG TABS Take 10 mg by mouth every 6 (six) hours as needed for pain. 06/21/16  Yes [provider]  polyethylene glycol (MIRALAX / GLYCOLAX) packet Take 17 g by mouth daily.   Yes [provider]  predniSONE (DELTASONE) 5 MG tablet Take 5 mg by mouth every morning.  05/15/16  Yes [provider]  sertraline (ZOLOFT) 100 MG tablet Take 100 mg by mouth daily.   Yes [provider]  vitamin B-12 (CYANOCOBALAMIN) 1000 MCG tablet Take 1,000 mcg by mouth every morning.   Yes [provider]  docusate sodium (COLACE) 100  MG capsule Take 1 capsule (100 mg total) by mouth 2 (two) times daily. 07/04/16   Theodoro Grist, MD    Allergies Byetta 10 mcg pen [exenatide]  History reviewed. No pertinent family history.  Social History Social History   Tobacco Use  . Smoking status: Never Smoker  . Smokeless tobacco: Never Used  Substance Use Topics  . Alcohol use: No  . Drug use: Not on file    Review of Systems Constitutional: No fever/chills Eyes: No visual changes. ENT: No sore throat. Cardiovascular: Denies chest pain. Respiratory: Denies shortness of breath. Gastrointestinal: No abdominal pain.  No nausea, no  vomiting.  No diarrhea.  No constipation. Genitourinary: Negative for dysuria. Musculoskeletal: Positive for hip pain Skin: Negative for rash. Neurological: Negative for headaches, focal weakness or numbness.   ____________________________________________   PHYSICAL EXAM:  VITAL SIGNS: ED Triage Vitals  Enc Vitals Group     BP 06/01/18 2221 (!) 158/98     Pulse Rate 06/01/18 2221 85     Resp 06/01/18 2221 17     Temp 06/01/18 2221 99.4 F (37.4 C)     Temp Source 06/01/18 2221 Oral     SpO2 06/01/18 2221 100 %     Weight 06/01/18 2222 210 lb (95.3 kg)     Height 06/01/18 2222 5\' 5"  (1.651 m)     Head Circumference --      Peak Flow --      Pain Score 06/01/18 2221 10     Pain Loc --      Pain Edu? --      Excl. in [place]? --     Constitutional: Alert and oriented x4 appears miserable and exquisite discomfort Eyes: PERRL EOMI. Head: Atraumatic. Nose: No congestion/rhinnorhea. Mouth/Throat: No trismus Neck: No stridor.   Cardiovascular: Normal rate, regular rhythm. Grossly normal heart sounds.  Good peripheral circulation. Respiratory: Normal respiratory effort.  No retractions. Lungs CTAB and moving good air Gastrointestinal: Soft nontender Musculoskeletal: Left lower extremity neurovascularly intact.  Exquisite discomfort when ranging hip.  Extensor mechanism intact knee stable Neurologic:  Normal speech and language. No gross focal neurologic deficits are appreciated. Skin:  Skin is warm, dry and intact. No rash noted. Psychiatric: Mood and affect are normal. Speech and behavior are normal.    ____________________________________________   DIFFERENTIAL includes but not limited to  Septic hip, rheumatoid arthritis, radicular pain, necrotizing fasciitis, acute arterial occlusion ____________________________________________   LABS (all labs ordered are listed, but only abnormal results are displayed)  Labs Reviewed  COMPREHENSIVE METABOLIC PANEL - Abnormal;  Notable for the following components:      Result Value   Glucose, Bld 200 (*)    BUN 63 (*)    Creatinine, Ser 3.49 (*)    GFR calc non Af Amer 11 (*)    GFR calc Af Amer 13 (*)    All other components within normal limits  CBC WITH DIFFERENTIAL/PLATELET - Abnormal; Notable for the following components:   WBC 16.5 (*)    Hemoglobin 11.3 (*)    HCT 33.4 (*)    RDW 14.9 (*)    Neutro Abs 14.4 (*)    Lymphs Abs 0.8 (*)    Monocytes Absolute 1.1 (*)    All other components within normal limits  SEDIMENTATION RATE - Abnormal; Notable for the following components:   Sed Rate 57 (*)    All other components within normal limits  GLUCOSE, CAPILLARY - Abnormal; Notable for the following components:  Glucose-Capillary 129 (*)    All other components within normal limits  GLUCOSE, CAPILLARY - Abnormal; Notable for the following components:   Glucose-Capillary 140 (*)    All other components within normal limits  GLUCOSE, CAPILLARY - Abnormal; Notable for the following components:   Glucose-Capillary 122 (*)    All other components within normal limits  BASIC METABOLIC PANEL - Abnormal; Notable for the following components:   Potassium 5.2 (*)    Glucose, Bld 197 (*)    BUN 61 (*)    Creatinine, Ser 3.36 (*)    GFR calc non Af Amer 12 (*)    GFR calc Af Amer 14 (*)    All other components within normal limits  CBC - Abnormal; Notable for the following components:   WBC 12.2 (*)    RBC 3.72 (*)    Hemoglobin 11.2 (*)    HCT 32.4 (*)    All other components within normal limits  GLUCOSE, CAPILLARY - Abnormal; Notable for the following components:   Glucose-Capillary 127 (*)    All other components within normal limits  GLUCOSE, CAPILLARY - Abnormal; Notable for the following components:   Glucose-Capillary 180 (*)    All other components within normal limits  MRSA PCR SCREENING  LACTIC ACID, PLASMA  PROTIME-INR  PROCALCITONIN    Lab work reviewed by me with worsening of her  CKD __________________________________________  EKG   ____________________________________________  RADIOLOGY  X-ray of the left hip reviewed by me with no acute disease CT pelvis and thigh reviewed by me with no obvious etiology of the patient's symptoms identified ____________________________________________   PROCEDURES  Procedure(s) performed: no  Procedures  Critical Care performed: no  ____________________________________________   INITIAL IMPRESSION / ASSESSMENT AND PLAN / ED COURSE  Pertinent labs & imaging results that were available during my care of the patient were reviewed by me and considered in my medical decision making (see chart for details).        ----------------------------------------- 1:25 AM on 06/02/2018 -----------------------------------------  Unclear etiology of the patient's symptoms and she appears to be demonstrating pain out of proportion.  I do not see an obvious fracture on x-ray so we will progress onto CT scan of the hip and thigh.  Differential at this point includes occult fracture versus arterial occlusion versus necrotizing infection versus septic hip etc.  IV fentanyl for pain control now. ____________________________________________  The patient's CT scans are unremarkable as well.  Her pain is improved after fentanyl however she is required multiple doses of pain medication to get it under control.  Given the diagnostic uncertainty and her inability to ambulate at this point she requires inpatient admission for continued IV opioid pain medication and likely MRI of her hip versus low back.  I have discussed with the hospitalist who has graciously agreed to admit the patient to his service.  FINAL CLINICAL IMPRESSION(S) / ED DIAGNOSES  Final diagnoses:  Pain of left hip joint      NEW MEDICATIONS STARTED DURING THIS VISIT:  Current Discharge Medication List       Note:  This document was prepared using Dragon voice  recognition software and may include unintentional dictation errors.     Darel Hong, MD 06/03/18 [id number]

## 2018-06-02 NOTE — ED Notes (Signed)
Report to rachel, rn.  

## 2018-06-02 NOTE — ED Notes (Signed)
Pt appears more comfortable post fentanyl administration.

## 2018-06-02 NOTE — Clinical Social Work Note (Signed)
CSW received a consult that the patient is from [facility]. CSW is not necessary for discharge planning unless the patient will have an elevation in level of care. CSW is signing off. Please consult should the patient need SNF or ALF, or should other needs arise.  Santiago Bumpers, MSW, Latanya Presser [phone]

## 2018-06-02 NOTE — Progress Notes (Signed)
Patient with diabetes, CKD stage V in the process of being started on dialysis, degenerative disc disease, history of cancer, rheumatoid arthritis presents to hospital secondary to sudden onset of left thigh and hip pain and fall. -Agree with history and physical.  Since no low back pain, discontinue MRI of lumbar spine. -X-rays and CT negative for any fractures.  Could be muscle injury.  MRI of hip and femur ordered. -Pain control, physical therapy consult

## 2018-06-02 NOTE — Plan of Care (Signed)
  Problem: Clinical Measurements: Goal: Will remain free from infection Outcome: Progressing   Problem: Activity: Goal: Risk for activity intolerance will decrease Outcome: Progressing   Problem: Nutrition: Goal: Adequate nutrition will be maintained Outcome: Progressing   Problem: Pain Managment: Goal: General experience of comfort will improve Outcome: Progressing   Problem: Safety: Goal: Ability to remain free from injury will improve Outcome: Progressing   Problem: Skin Integrity: Goal: Risk for impaired skin integrity will decrease Outcome: Progressing   Problem: Nutritional: Goal: Ability to make appropriate dietary choices will improve Outcome: Progressing

## 2018-06-02 NOTE — Plan of Care (Signed)
  Problem: Clinical Measurements: Goal: Respiratory complications will improve Outcome: Progressing   Problem: Pain Managment: Goal: General experience of comfort will improve Outcome: Progressing   

## 2018-06-02 NOTE — H&P (Addendum)
[facility] [place] NAME: Christina Mercado    MR#:  [id number]  DATE OF BIRTH:  [birth year]  DATE OF ADMISSION:  06/02/2018  PRIMARY CARE PHYSICIAN: Ballard Russell, MD   REQUESTING/REFERRING PHYSICIAN: Darel Hong, MD  CHIEF COMPLAINT:   Chief Complaint  Patient presents with  . Leg Pain    HISTORY OF PRESENT ILLNESS:  Christina Mercado  is a 82 y.o. female with a known history of T2NIDDM, CKD V, DJD/OA (s/p R total hip arthroplasty), RA (steroids + Plaquenil), light chain deposition disease who p/w 1d Hx acute non-traumatic L hip/knee pain. Pt has received Fentanyl and Valium in ED, and is sleeping. She is transiently arousable, and answers questions appropriately, but falls back asleep rapidly. She denies trauma or fall. She states she was walking when she developed sudden pain. She says her left leg is weak and she endorses limitation in range of motion. She denies fever/chills. She states she feels well otherwise. CT imaging of L hip/pelvis (-) fracture/dislocation, (+) degenerative changes.  Followed by Nephrology @ [facility], seen on 06/18 by Dr. Jennelle Human. Bx-proven light chain deposition disease. Has attended CKD education class. Intention is to initiate HD. Scheduled for permanent HD access evaluation w/ Dr. Laverta Baltimore on 07/01. Cr was 3.6 on 05/28/2018. Cr is 3.49 on present admission (stable).  PAST MEDICAL HISTORY:   Past Medical History:  Diagnosis Date  . Diabetes mellitus without complication ([place])   . Hypertension   . Kidney failure    stage 3  . Proteinuria     PAST SURGICAL HISTORY:   Past Surgical History:  Procedure Laterality Date  . RENAL BIOPSY    . TOTAL HIP ARTHROPLASTY Right     SOCIAL HISTORY:   Social History   Tobacco Use  . Smoking status: Never Smoker  . Smokeless tobacco: Never Used  Substance Use Topics  . Alcohol use: No    FAMILY HISTORY:  History reviewed. No pertinent family  history.  DRUG ALLERGIES:   Allergies  Allergen Reactions  . Byetta 10 Mcg Pen [Exenatide] Nausea And Vomiting and Other (See Comments)    Patient states this medication made her lose weight.    REVIEW OF SYSTEMS:   Review of Systems  Constitutional: Negative for chills, diaphoresis, fever, malaise/fatigue and weight loss.  HENT: Negative for congestion, ear pain, hearing loss, nosebleeds, sinus pain, sore throat and tinnitus.   Eyes: Negative for blurred vision, double vision and photophobia.  Respiratory: Negative for cough, hemoptysis, sputum production, shortness of breath and wheezing.   Cardiovascular: Negative for chest pain, palpitations, orthopnea, claudication, leg swelling and PND.  Gastrointestinal: Negative for abdominal pain, blood in stool, constipation, diarrhea, heartburn, melena, nausea and vomiting.  Genitourinary: Negative for dysuria, frequency, hematuria and urgency.  Musculoskeletal: Positive for joint pain. Negative for back pain, falls, myalgias and neck pain.  Skin: Negative for itching and rash.  Neurological: Negative for dizziness, tingling, tremors, sensory change, speech change, focal weakness, seizures, loss of consciousness, weakness and headaches.  Psychiatric/Behavioral: Negative for memory loss. The patient does not have insomnia.    MEDICATIONS AT HOME:   Prior to Admission medications   Medication Sig Start Date End Date Taking? Authorizing Provider  acetaminophen (TYLENOL) 650 MG CR tablet Take 650 mg by mouth every 8 (eight) hours as needed for pain.   Yes [provider]  amLODipine (NORVASC) 5 MG tablet Take 1 tablet (5 mg total) by mouth every morning.  Patient taking differently: Take 2.5 mg by mouth daily.  07/04/16  Yes Theodoro Grist, MD  atorvastatin (LIPITOR) 40 MG tablet Take 40 mg by mouth daily at 6 PM.   Yes [provider]  Cholecalciferol (VITAMIN D3) 1000 units CAPS Take 2,000 Units by mouth every morning.   Yes  [provider]  ferrous sulfate 325 (65 FE) MG tablet Take 325 mg by mouth daily with breakfast.   Yes [provider]  glimepiride (AMARYL) 1 MG tablet Take 1 mg by mouth daily with breakfast.   Yes [provider]  Levothyroxine Sodium 150 MCG CAPS Take 137 mcg by mouth daily before breakfast.  06/17/16  Yes [provider]  magnesium oxide (MAG-OX) 400 MG tablet Take 400 mg by mouth daily.   Yes [provider]  Oxycodone HCl 10 MG TABS Take 10 mg by mouth every 6 (six) hours as needed for pain. 06/21/16  Yes [provider]  polyethylene glycol (MIRALAX / GLYCOLAX) packet Take 17 g by mouth daily.   Yes [provider]  predniSONE (DELTASONE) 5 MG tablet Take 5 mg by mouth every morning.  05/15/16  Yes [provider]  sertraline (ZOLOFT) 100 MG tablet Take 100 mg by mouth daily.   Yes [provider]  vitamin B-12 (CYANOCOBALAMIN) 1000 MCG tablet Take 1,000 mcg by mouth every morning.   Yes [provider]  docusate sodium (COLACE) 100 MG capsule Take 1 capsule (100 mg total) by mouth 2 (two) times daily. 07/04/16   Theodoro Grist, MD      VITAL SIGNS:  Blood pressure (!) 172/60, pulse 92, temperature 99.4 F (37.4 C), temperature source Oral, resp. rate 17, height 5\' 5"  (1.651 m), weight 95.3 kg (210 lb), SpO2 99 %.  PHYSICAL EXAMINATION:  Physical Exam  Constitutional: She is oriented to person, place, and time. She appears well-developed and well-nourished. She appears lethargic. She is sleeping and cooperative. She is easily aroused.  Non-toxic appearance. She does not have a sickly appearance. She does not appear ill. No distress. She is not intubated.  HENT:  Head: Normocephalic and atraumatic.  Mouth/Throat: Oropharynx is clear and moist. No oropharyngeal exudate.  Eyes: Conjunctivae, EOM and lids are normal. No scleral icterus.  Neck: Neck supple. No JVD present. No thyromegaly present.    Cardiovascular: Normal rate, regular rhythm, S1 normal, S2 normal and normal heart sounds.  No extrasystoles are present. Exam reveals no gallop, no S3, no S4, no distant heart sounds and no friction rub.  No murmur heard. Pulmonary/Chest: Effort normal and breath sounds normal. No accessory muscle usage or stridor. No apnea, no tachypnea and no bradypnea. She is not intubated. No respiratory distress. She has no decreased breath sounds. She has no wheezes. She has no rhonchi. She has no rales.  Abdominal: Soft. Bowel sounds are normal. She exhibits no distension. There is no tenderness. There is no rebound and no guarding.  Musculoskeletal: She exhibits tenderness. She exhibits no edema.  Lymphadenopathy:    She has no cervical adenopathy.  Neurological: She is oriented to person, place, and time and easily aroused. She appears lethargic. She is not disoriented.  Skin: Skin is warm, dry and intact. No rash noted. She is not diaphoretic. No erythema.  Psychiatric: She has a normal mood and affect. Her speech is normal and behavior is normal. Judgment and thought content normal. Cognition and memory are normal.  Somnolent/lethargic (2/2 narcotics/benzodiazepines) but easily arousable.   (+) L hip +  knee pain, worse w/ AROM/PROM, limited AROM/PROM 2/2 pain, (-) swelling/effusion/deformity. LABORATORY PANEL:   CBC Recent Labs  Lab 06/02/18 0130  WBC 16.5*  HGB 11.3*  HCT 33.4*  PLT 223   ------------------------------------------------------------------------------------------------------------------  Chemistries  Recent Labs  Lab 06/02/18 0130  NA 136  K 3.7  CL 102  CO2 24  GLUCOSE 200*  BUN 63*  CREATININE 3.49*  CALCIUM 9.3  AST 19  ALT 14  ALKPHOS 73  BILITOT 0.4   ------------------------------------------------------------------------------------------------------------------  Cardiac Enzymes No results for input(s): TROPONINI in the last 168  hours. ------------------------------------------------------------------------------------------------------------------  RADIOLOGY:  Ct Pelvis Wo Contrast  Result Date: 06/02/2018 CLINICAL DATA:  Left anterior thigh pain. Patient was walking yesterday and thinks she pulled something. EXAM: CT PELVIS WITHOUT CONTRAST TECHNIQUE: Multidetector CT imaging of the pelvis was performed following the standard protocol without intravenous contrast. COMPARISON:  Left femur radiographs 06/02/2018 FINDINGS: Urinary Tract: Bladder wall is not thickened and no filling defects are identified. Asymmetry of the posterior wall suggests a left posterolateral bladder diverticulum. Bowel: Visualized portions of the small bowel and colon are not abnormally distended. Stool fills the visualized colon. Appendicoliths at the base of the appendix. Appendix is otherwise normal. No evidence of appendiceal dilatation or inflammatory change. Vascular/Lymphatic: Vascular calcifications in the lower aorta and iliac arteries. No significant lymphadenopathy. Reproductive: Uterus may be partially surgically absent or atrophic. No abnormal adnexal masses. Other:  No free air or free fluid in the pelvis. Musculoskeletal: Postoperative right total hip arthroplasty. Components appear well seated. Degenerative changes in the lower lumbar spine, SI joints, and left hip. Pelvis and hips appear intact. No acute fractures identified. No focal bone lesion or bone destruction. SI joints and symphysis pubis are not displaced. IMPRESSION: 1. Postoperative right hip arthroplasty. Components appear well seated. 2. Degenerative changes in the lower lumbar spine, SI joints, and left hip. 3. No acute fracture or dislocation identified. 4. Probable posterior bladder diverticulum. 5. Appendicoliths without evidence of appendicitis. Electronically Signed   By: Lucienne Capers M.D.   On: 06/02/2018 02:57   Ct Femur Left Wo Contrast  Result Date:  06/02/2018 CLINICAL DATA:  Left leg pain. EXAM: CT OF THE LOWER LEFT EXTREMITY WITHOUT CONTRAST TECHNIQUE: Multidetector CT imaging of the lower left extremity was performed according to the standard protocol. COMPARISON:  None. FINDINGS: Bones/Joint/Cartilage Degenerative changes in the left hip with narrowing of the acetabular joint, sclerosis and osteophyte formation on both sides of the joint. Tiny subcortical cysts on both sides of the joint. Degenerative changes in the left knee with medial, lateral, and patellofemoral joint space narrowing and osteophyte formation. Subcortical cysts mainly in the tibia and lateral femoral condyle. No evidence of acute fracture or dislocation. Diffuse bone demineralization. Ligaments Suboptimally assessed by CT. Muscles and Tendons No intramuscular mass or hematoma identified. Soft tissues No infiltration or inflammatory changes in the soft tissues. No soft tissue gas or fluid collections. IMPRESSION: Degenerative changes in the left hip and left knee. No acute fractures identified. Diffuse bone demineralization. Soft tissues are unremarkable. Electronically Signed   By: Lucienne Capers M.D.   On: 06/02/2018 03:00   Dg Femur Min 2 Views Left  Result Date: 06/02/2018 CLINICAL DATA:  Pain after walking EXAM: LEFT FEMUR 2 VIEWS COMPARISON:  None. FINDINGS: Pubic symphysis and rami are intact. No acute displaced fracture or malalignment. Moderate degenerative changes at the left hip. Chondrocalcinosis and degenerative change at the knee. IMPRESSION: 1. No acute osseous abnormality. 2. Moderate arthritis of  the left hip Electronically Signed   By: Donavan Foil M.D.   On: 06/02/2018 01:41   IMPRESSION AND PLAN:   A/P: 61F intractable acute non-traumatic L hip/knee pain w/ LLE weakness + ambulatory failure. Also w/ CKD V (followed by Nephrology @ [facility]), hyperglycemia (w/ T2NIDDM), leukocytosis, normocytic anemia. -Etiology of LLE pain symptoms presently unclear, pt w/ Hx  RA and light chain deposition disease as well as DJD/OA (w/ Hx R total hip arthroplasty) -Infxn unlikely, afebrile, SIRS (-), PCT low -DDx includes but is not limited to DJD/OA, spinal stenosis, mass lesion w/ compression, occult/pathologic fracture, rheumatoid/inflammatory etiology, other -CT imaging of L hip/pelvis (-) fracture/dislocation -Admit to Med/Surg -Symptomatic mgmt, pain ctrl, antispasmodics -MRI L-spine pending -May need P/T eval -CKD V, Cr at baseline (stable compared to recent outpt labs). Monitor BMP, avoid nephrotoxins -SSI, hold PO antihyperglycemics -Leukocytosis likely 2/2 long-term steroid use -Normocytic anemia, likely anemia of chronic disease. No evidence of acute blood loss -c/w other home meds -Renal diabetic diet -Heparin -Full code -Admission, > 2 midnights   All the records are reviewed and case discussed with ED provider. Management plans discussed with the patient, family and they are in agreement.  CODE STATUS: Full code  TOTAL TIME TAKING CARE OF THIS PATIENT: 75 minutes.    Arta Silence M.D on 06/02/2018 at 3:55 AM  Between 7am to 6pm - Pager - [phone]  After 6pm go to [email or web address] - Proofreader  [facility]  Office  [phone]  CC: Primary care physician; Ballard Russell, MD   Note: This dictation was prepared with Dragon dictation along with smaller phrase technology. Any transcriptional errors that result from this process are unintentional.

## 2018-06-02 NOTE — ED Notes (Signed)
Transport patient to floor room 246.AS

## 2018-06-02 NOTE — Consult Note (Signed)
Reason for consult was left hip pain an 82 year old without trauma  History: Patient is somewhat drowsy this evening and a little difficult to determine entire history but it seems like she had onset of pain about a day and a half ago with pain while walking without a fall.  She does have a history complicated by rheumatoid arthritis as well as diabetes.  She has a history of prior right total hip replacement and she does not recall when that was.  She has a great deal of difficulty bearing any weight on the left leg and has pain with any motion  On examination she holds leg and external rotation and flexion consistent with effusion shown on MRI she is able flex extend her toes her foot is warm.  On passive flexion she has significant pain as with internal and external rotation which is limited to 20 and 30 degrees.  MRIs reviewed and shows degenerative changes as well as a few effusion  Medical impression is probable rheumatoid flare, will give additional IV steroid overnight.  If that does not improve her symptoms I would recommend hip aspiration and possible infection and if that were positive she need hip irrigation and debridement tomorrow evening.

## 2018-06-03 DIAGNOSIS — M25559 Pain in unspecified hip: Secondary | ICD-10-CM | POA: Diagnosis present

## 2018-06-03 DIAGNOSIS — M25552 Pain in left hip: Secondary | ICD-10-CM | POA: Diagnosis not present

## 2018-06-03 LAB — CBC
HCT: 32.4 % — ABNORMAL LOW (ref 35.0–47.0)
HEMOGLOBIN: 11.2 g/dL — AB (ref 12.0–16.0)
MCH: 30.2 pg (ref 26.0–34.0)
MCHC: 34.7 g/dL (ref 32.0–36.0)
MCV: 87.1 fL (ref 80.0–100.0)
Platelets: 185 10*3/uL (ref 150–440)
RBC: 3.72 MIL/uL — ABNORMAL LOW (ref 3.80–5.20)
RDW: 14.5 % (ref 11.5–14.5)
WBC: 12.2 10*3/uL — ABNORMAL HIGH (ref 3.6–11.0)

## 2018-06-03 LAB — BASIC METABOLIC PANEL
ANION GAP: 9 (ref 5–15)
BUN: 61 mg/dL — ABNORMAL HIGH (ref 6–20)
CALCIUM: 9.4 mg/dL (ref 8.9–10.3)
CO2: 23 mmol/L (ref 22–32)
Chloride: 106 mmol/L (ref 101–111)
Creatinine, Ser: 3.36 mg/dL — ABNORMAL HIGH (ref 0.44–1.00)
GFR calc Af Amer: 14 mL/min — ABNORMAL LOW (ref >60)
GFR, EST NON AFRICAN AMERICAN: 12 mL/min — AB (ref >60)
GLUCOSE: 197 mg/dL — AB (ref 65–99)
Potassium: 5.2 mmol/L — ABNORMAL HIGH (ref 3.5–5.1)
SODIUM: 138 mmol/L (ref 135–145)

## 2018-06-03 LAB — GLUCOSE, CAPILLARY
GLUCOSE-CAPILLARY: 180 mg/dL — AB (ref 65–99)
Glucose-Capillary: 192 mg/dL — ABNORMAL HIGH (ref 65–99)

## 2018-06-03 MED ORDER — AMLODIPINE BESYLATE 5 MG PO TABS: 2.5000 mg | ORAL_TABLET | Freq: Every day | ORAL | Status: DC

## 2018-06-03 MED ORDER — PREDNISONE 10 MG PO TABS: 10.0000 mg | ORAL_TABLET | Freq: Every day | ORAL | 0 refills | Status: DC

## 2018-06-03 NOTE — Care Management (Signed)
From [place].  Independent in all adls, denies issues accessing medical care, obtaining medications or with transportation.  Current with her PCP. Has a walker in the home.  Present with pain left hip and found to have large joint effusion.  Ortho is consulting. Received intravenous steroids.  Rheumatology to consult as it is felt current symptoms are related to patient's rheumatoid arthritis. She is being followed by nephrology at [facility] and is in the process of  initiating hemodialysis.  Physical therapy recommending home health

## 2018-06-03 NOTE — Care Management Obs Status (Signed)
[facility] NOTIFICATION   Patient Details  Name: Christina Mercado MRN: [id number] Date of Birth: 05/28/1936   Medicare Observation Status Notification Given:  Yes    Katrina Stack, RN 06/03/2018, 1:26 PM

## 2018-06-03 NOTE — Progress Notes (Signed)
Patient is seen this a.m. after having some Solu-Medrol overnight.  She is very anxious about motion of the hip but is clearly more comfortable than she was last night and it allows me to move her leg around.  impression is rheumatoid flare of left hip  Recommendation is for physical therapy to work with her today the high-dose taper if her diabetes allows for this and if she has persisting pain as an outpatient she could have a hip intra-articular steroid injection through our office at [facility]

## 2018-06-03 NOTE — Care Management CC44 (Signed)
Condition Code 44 Documentation Completed  Patient Details  Name: Christina Mercado MRN: [id number] Date of Birth: December 06, 1936   Condition Code 44 given:  Yes Patient signature on Condition Code 44 notice:  Yes Documentation of 2 MD's agreement:  Yes Code 44 added to claim:  Yes    Katrina Stack, RN 06/03/2018, 1:26 PM

## 2018-06-03 NOTE — Evaluation (Signed)
Physical Therapy Evaluation Patient Details Name: Christina Mercado MRN: [id number] DOB: 1936/01/14 Today's Date: 06/03/2018   History of Present Illness  Patient is an 82 year old female admitted from ALF for CKD and joint effusion of L hip and bilateral knees following c/o severe L hip pain.  PMH includes kidney failure, Htn and DM.  Clinical Impression  Patient is an 82 year old female who lives in an ALF alone.  She is independent with limited community ambulation using a RW.  Pt required min A for bed mobility to initiate movement of L LE due to pain and apprehension.  Pt able to sit at EOB and scoot along EOB with min VC's though she reports feeling unsteady.  PT provided mod A for STS and pt was able to pivot to chair with VC's for use of RW and close guard for safety.  Pt maintains a flexed posture when standing and reports 5/10 pain in L hip and bilateral knees with mobility, causing apprehension with movement and need for direction with breathing exercises for relaxation before proceeding with mobility.  Ambulation deferred for this reason.  Pt presents with overall weakness of UE and LE and limited ROM of LE joints.  Pt very determined to participate in PT and to recover with good prognosis for return to PLOF.  Pt will continue to benefit from skilled PT with focus on safe mobility, ambulation with RW, HEP for strengthening and ROM and pain management.    Follow Up Recommendations Home health PT;Supervision for mobility/OOB    Equipment Recommendations  None recommended by PT    Recommendations for Other Services       Precautions / Restrictions Precautions Precautions: Fall Restrictions Weight Bearing Restrictions: No      Mobility  Bed Mobility Overal bed mobility: Needs Assistance Bed Mobility: Supine to Sit     Supine to sit: Min assist     General bed mobility comments: Assistance with mobility of L LE.  Pt very apprehensive and better able to initiate movement  independently as transfer progressed.  Can scoot along EOB to R with min VC's for foot placement.  Transfers Overall transfer level: Needs assistance Equipment used: Rolling walker (2 wheeled) Transfers: Sit to/from [facility] Sit to Stand: Mod assist Stand pivot transfers: Min assist       General transfer comment: Assistance to initiate STS and VC's for upright posture.  Pt able to complete pivot transfer to chair with VC's for management of RW and hand placement on chair.  Maintains flexed posture when standing.  Pt able to control descent to chair.  Ambulation/Gait Ambulation/Gait assistance: (Did not perform due to pt report of pain increase and apprehension when standing.)              Stairs            Wheelchair Mobility    Modified Rankin (Stroke Patients Only)       Balance Overall balance assessment: Needs assistance Sitting-balance support: Bilateral upper extremity supported;Feet supported Sitting balance-Leahy Scale: Fair Sitting balance - Comments: Pt states that she feels unsteady when sitting at EOB and returns to a posterolateral lean to R side 2-3x.   Standing balance support: Bilateral upper extremity supported Standing balance-Leahy Scale: Fair                               Pertinent Vitals/Pain Pain Assessment: 0-10 Pain Score: 5  Pain  Location: L hip and knees with mobility, 0/10 at rest. Pain Descriptors / Indicators: Aching Pain Intervention(s): Limited activity within patient's tolerance;Monitored during session    Home Living Family/patient expects to be discharged to:: Assisted living               Home Equipment: Walker - 2 wheels;Cane - single point Additional Comments: Has been using a RW for 5 years approx. for home and limited community ambulations.    Prior Function Level of Independence: Independent with assistive device(s)         Comments: Independent with ADL's     Hand  Dominance        Extremity/Trunk Assessment   Upper Extremity Assessment Upper Extremity Assessment: Generalized weakness    Lower Extremity Assessment Lower Extremity Assessment: Generalized weakness(Bilateral LE's limited in ROM and MMT due to pain increase and apprehension.  Pt able to perform AROM knee extension and seated marching sitting at EOB.)    Cervical / Trunk Assessment Cervical / Trunk Assessment: Kyphotic  Communication   Communication: HOH  Cognition Arousal/Alertness: Awake/alert Behavior During Therapy: Restless Overall Cognitive Status: Within Functional Limits for tasks assessed                                 General Comments: Pt oriented to self and situation but not to date.  Becomes restless intermittently and requires to be directed in deep breathing for relaxation.  Pt very determined to be pleasant and to cooperate with PT.      General Comments      Exercises General Exercises - Lower Extremity Long Arc Quad: AROM;Both;10 reps;Seated Hip Flexion/Marching: AROM;Both;10 reps;Seated   Assessment/Plan    PT Assessment Patient needs continued PT services  PT Problem List Decreased strength;Decreased mobility;Decreased balance;Decreased knowledge of use of DME;Pain;Decreased activity tolerance       PT Treatment Interventions DME instruction;Therapeutic activities;Gait training;Therapeutic exercise;Stair training;Balance training;Functional mobility training;Neuromuscular re-education;Patient/family education    PT Goals (Current goals can be found in the Care Plan section)  Acute Rehab PT Goals Patient Stated Goal: Pt states that she wants her legs to "get back to normal" and decrease pain. PT Goal Formulation: With patient Time For Goal Achievement: 06/17/18 Potential to Achieve Goals: Good    Frequency Min 2X/week   Barriers to discharge        Co-evaluation               AM-PAC PT "6 Clicks" Daily Activity   Outcome Measure Difficulty turning over in bed (including adjusting bedclothes, sheets and blankets)?: A Little Difficulty moving from lying on back to sitting on the side of the bed? : A Little Difficulty sitting down on and standing up from a chair with arms (e.g., wheelchair, bedside commode, etc,.)?: A Lot Help needed moving to and from a bed to chair (including a wheelchair)?: A Little Help needed walking in hospital room?: A Lot Help needed climbing 3-5 steps with a railing? : A Lot 6 Click Score: 15    End of Session Equipment Utilized During Treatment: Gait belt Activity Tolerance: Patient limited by pain Patient left: in chair;with call bell/phone within reach;with nursing/sitter in room Nurse Communication: Mobility status PT Visit Diagnosis: Unsteadiness on feet (R26.81);Muscle weakness (generalized) (M62.81);Pain Pain - Right/Left: Left Pain - part of body: Hip;Knee(Bilateral knees.)    Time: 0825-0900 PT Time Calculation (min) (ACUTE ONLY): 35 min   Charges:   PT  Evaluation $PT Eval Low Complexity: 1 Low PT Treatments $Therapeutic Activity: 8-22 mins   PT G Codes:   PT G-Codes **NOT FOR INPATIENT CLASS** Functional Assessment Tool Used: AM-PAC 6 Clicks Basic Mobility    Roxanne Gates, PT, DPT   Roxanne Gates 06/03/2018, 9:30 AM

## 2018-06-03 NOTE — Care Management (Signed)
Patient is agreeable to home health service and agency preference is "the one that is with the hospital"- which is [facility].  Referral called and accepted by [facility].

## 2018-06-03 NOTE — Discharge Summary (Signed)
[facility] NAME: Christina Mercado    MR#:  [id number]  DATE OF BIRTH:  [birth year]  DATE OF ADMISSION:  06/02/2018 ADMITTING PHYSICIAN: Arta Silence, MD  DATE OF DISCHARGE: 06/03/2018  PRIMARY CARE PHYSICIAN: Ballard Russell, MD    ADMISSION DIAGNOSIS:  leg pain  DISCHARGE DIAGNOSIS:  Active Problems:   CKD (chronic kidney disease), stage V (HCC)   Acute pain of left hip   Acute pain of left knee   Inability to ambulate due to multiple joints   SECONDARY DIAGNOSIS:   Past Medical History:  Diagnosis Date  . Diabetes mellitus without complication ([place])   . Hypertension   . Kidney failure    stage 3  . Proteinuria     HOSPITAL COURSE:  82 year old female with chronic kidney disease stage V in the process of being started on dialysis, rheumatoid arthritis and degenerative disc disease who presented to the ER due to left thigh and hip pain after fall.  1.  Left hip pain: Patient was eval by orthopedic surgery. MRI shows effusion.  She is able to flex and extend her leg. Diagnosis is rheumatoid flare.  Patient was given IV steroids which dramatically helped.  Patient will be discharged on high-dose steroid taper as recommended by orthopedic surgery.  She will be followed up in 1 week with orthopedic surgery and if needed at that time she may have intra-articular steroid injection if needed.  2.  Rheumatoid arthritis: After her steroid taper she will resume 5 mg of prednisone which she takes on a daily basis  3.  Chronic kidney disease stage V: Patient will follow-up with her nephrologist  4.  Essential hypertension: Continue low-dose Norvasc   DISCHARGE CONDITIONS AND DIET:   Stable for discharge on regular diet  CONSULTS OBTAINED:  Treatment Team:  Arta Silence, MD Hessie Knows, MD Bettey Costa, MD  DRUG ALLERGIES:   Allergies  Allergen Reactions  . Byetta 10 Mcg Pen [Exenatide] Nausea And  Vomiting and Other (See Comments)    Patient states this medication made her lose weight.    DISCHARGE MEDICATIONS:   Allergies as of 06/03/2018      Reactions   Byetta 10 Mcg Pen [exenatide] Nausea And Vomiting, Other (See Comments)   Patient states this medication made her lose weight.      Medication List    TAKE these medications   acetaminophen 650 MG CR tablet Commonly known as:  TYLENOL Take 650 mg by mouth every 8 (eight) hours as needed for pain.   amLODipine 5 MG tablet Commonly known as:  NORVASC Take 0.5 tablets (2.5 mg total) by mouth daily.   atorvastatin 40 MG tablet Commonly known as:  LIPITOR Take 40 mg by mouth daily at 6 PM.   docusate sodium 100 MG capsule Commonly known as:  COLACE Take 1 capsule (100 mg total) by mouth 2 (two) times daily.   ferrous sulfate 325 (65 FE) MG tablet Take 325 mg by mouth daily with breakfast.   glimepiride 1 MG tablet Commonly known as:  AMARYL Take 1 mg by mouth daily with breakfast.   Levothyroxine Sodium 150 MCG Caps Take 137 mcg by mouth daily before breakfast.   magnesium oxide 400 MG tablet Commonly known as:  MAG-OX Take 400 mg by mouth daily.   Oxycodone HCl 10 MG Tabs Take 10 mg by mouth every 6 (six) hours as needed for pain.   polyethylene glycol packet Commonly  known as:  MIRALAX / GLYCOLAX Take 17 g by mouth daily.   predniSONE 5 MG tablet Commonly known as:  DELTASONE Take 5 mg by mouth every morning. What changed:  Another medication with the same name was added. Make sure you understand how and when to take each.   predniSONE 10 MG tablet Commonly known as:  DELTASONE Take 1 tablet (10 mg total) by mouth daily with breakfast. 50 mg PO (ORAL)  x 2 days 40 mg PO (ORAL)  x 2 days 30 mg PO  (ORAL)  x 2 days 20 mg PO  (ORAL) x 2 days 10 mg PO  (ORAL) x 2 days then  Resume 5 mg daily What changed:  You were already taking a medication with the same name, and this prescription was added. Make  sure you understand how and when to take each.   sertraline 100 MG tablet Commonly known as:  ZOLOFT Take 100 mg by mouth daily.   vitamin B-12 1000 MCG tablet Commonly known as:  CYANOCOBALAMIN Take 1,000 mcg by mouth every morning.   Vitamin D3 1000 units Caps Take 2,000 Units by mouth every morning.         Today   CHIEF COMPLAINT:   Doing better this morning.   VITAL SIGNS:  Blood pressure (!) 166/74, pulse 79, temperature 98.1 F (36.7 C), temperature source Oral, resp. rate 16, height 5\' 5"  (1.651 m), weight 95.3 kg (210 lb), SpO2 98 %.   REVIEW OF SYSTEMS:  Review of Systems  Constitutional: Negative.  Negative for chills, fever and malaise/fatigue.  HENT: Negative.  Negative for ear discharge, ear pain, hearing loss, nosebleeds and sore throat.   Eyes: Negative.  Negative for blurred vision and pain.  Respiratory: Negative.  Negative for cough, hemoptysis, shortness of breath and wheezing.   Cardiovascular: Negative.  Negative for chest pain, palpitations and leg swelling.  Gastrointestinal: Negative.  Negative for abdominal pain, blood in stool, diarrhea, nausea and vomiting.  Genitourinary: Negative.  Negative for dysuria.  Musculoskeletal: Positive for joint pain (better). Negative for back pain.  Skin: Negative.   Neurological: Negative for dizziness, tremors, speech change, focal weakness, seizures and headaches.  Endo/Heme/Allergies: Negative.  Does not bruise/bleed easily.  Psychiatric/Behavioral: Negative.  Negative for depression, hallucinations and suicidal ideas.     PHYSICAL EXAMINATION:  GENERAL:  82 y.o.-year-old patient lying in the bed with no acute distress.  NECK:  Supple, no jugular venous distention. No thyroid enlargement, no tenderness.  LUNGS: Normal breath sounds bilaterally, no wheezing, rales,rhonchi  No use of accessory muscles of respiration.  CARDIOVASCULAR: S1, S2 normal. No murmurs, rubs, or gallops.  ABDOMEN: Soft,  non-tender, non-distended. Bowel sounds present. No organomegaly or mass.  EXTREMITIES: No pedal edema, cyanosis, or clubbing.  She has mobility of her hip joints although it is painful no swelling no pain at joint line PSYCHIATRIC: The patient is alert and oriented x 3.  SKIN: No obvious rash, lesion, or ulcer.   DATA REVIEW:   CBC Recent Labs  Lab 06/03/18 0433  WBC 12.2*  HGB 11.2*  HCT 32.4*  PLT 185    Chemistries  Recent Labs  Lab 06/02/18 0130 06/03/18 0433  NA 136 138  K 3.7 5.2*  CL 102 106  CO2 24 23  GLUCOSE 200* 197*  BUN 63* 61*  CREATININE 3.49* 3.36*  CALCIUM 9.3 9.4  AST 19  --   ALT 14  --   ALKPHOS 73  --  BILITOT 0.4  --     Cardiac Enzymes No results for input(s): TROPONINI in the last 168 hours.  Microbiology Results  @[email or web address] @  RADIOLOGY:  Ct Pelvis Wo Contrast  Result Date: 06/02/2018 CLINICAL DATA:  Left anterior thigh pain. Patient was walking yesterday and thinks she pulled something. EXAM: CT PELVIS WITHOUT CONTRAST TECHNIQUE: Multidetector CT imaging of the pelvis was performed following the standard protocol without intravenous contrast. COMPARISON:  Left femur radiographs 06/02/2018 FINDINGS: Urinary Tract: Bladder wall is not thickened and no filling defects are identified. Asymmetry of the posterior wall suggests a left posterolateral bladder diverticulum. Bowel: Visualized portions of the small bowel and colon are not abnormally distended. Stool fills the visualized colon. Appendicoliths at the base of the appendix. Appendix is otherwise normal. No evidence of appendiceal dilatation or inflammatory change. Vascular/Lymphatic: Vascular calcifications in the lower aorta and iliac arteries. No significant lymphadenopathy. Reproductive: Uterus may be partially surgically absent or atrophic. No abnormal adnexal masses. Other:  No free air or free fluid in the pelvis. Musculoskeletal: Postoperative right total hip arthroplasty. Components  appear well seated. Degenerative changes in the lower lumbar spine, SI joints, and left hip. Pelvis and hips appear intact. No acute fractures identified. No focal bone lesion or bone destruction. SI joints and symphysis pubis are not displaced. IMPRESSION: 1. Postoperative right hip arthroplasty. Components appear well seated. 2. Degenerative changes in the lower lumbar spine, SI joints, and left hip. 3. No acute fracture or dislocation identified. 4. Probable posterior bladder diverticulum. 5. Appendicoliths without evidence of appendicitis. Electronically Signed   By: Lucienne Capers M.D.   On: 06/02/2018 02:57   Ct Femur Left Wo Contrast  Result Date: 06/02/2018 CLINICAL DATA:  Left leg pain. EXAM: CT OF THE LOWER LEFT EXTREMITY WITHOUT CONTRAST TECHNIQUE: Multidetector CT imaging of the lower left extremity was performed according to the standard protocol. COMPARISON:  None. FINDINGS: Bones/Joint/Cartilage Degenerative changes in the left hip with narrowing of the acetabular joint, sclerosis and osteophyte formation on both sides of the joint. Tiny subcortical cysts on both sides of the joint. Degenerative changes in the left knee with medial, lateral, and patellofemoral joint space narrowing and osteophyte formation. Subcortical cysts mainly in the tibia and lateral femoral condyle. No evidence of acute fracture or dislocation. Diffuse bone demineralization. Ligaments Suboptimally assessed by CT. Muscles and Tendons No intramuscular mass or hematoma identified. Soft tissues No infiltration or inflammatory changes in the soft tissues. No soft tissue gas or fluid collections. IMPRESSION: Degenerative changes in the left hip and left knee. No acute fractures identified. Diffuse bone demineralization. Soft tissues are unremarkable. Electronically Signed   By: Lucienne Capers M.D.   On: 06/02/2018 03:00   Mr Femur Left Wo Contrast  Result Date: 06/02/2018 CLINICAL DATA:  One day history of acute  nontraumatic left hip and knee pain. Patient has a history of a right total hip arthroplasty and history of rheumatoid arthritis. EXAM: MR OF THE LEFT HIP WITHOUT CONTRAST TECHNIQUE: Multiplanar, multisequence MR imaging was performed. No intravenous contrast was administered. COMPARISON:  CT scan from earlier today. FINDINGS: A right hip prosthesis is noted. No obvious complicating features. The left hip demonstrates moderate degenerative changes with joint space narrowing, spurring and subchondral cystic change versus erosions. There is a large left hip joint effusion along with marked inflammation around the joint involving the surrounding musculature and extending into the abductor muscles. This could be due to acute flare up of rheumatoid arthritis, posttraumatic synovitis or possibly septic  arthritis in the right clinical setting. This certainly would explain the patient's left hip and proximal thigh pain. I do not see a discrete muscle tear or intramuscular hematoma. No fractures are identified. The pubic symphysis and SI joints are intact. Mild fatty atrophy of the gluteal and hip muscles bilaterally. No subcutaneous lesions. No significant intrapelvic abnormalities are identified. No inguinal mass, hernia or adenopathy. The femur is unremarkable. The surrounding thigh musculature appears intact. Bilateral knee joint effusions are noted incidentally and could be related to rheumatoid arthritis also. IMPRESSION: 1. Large left hip joint effusion with significant surrounding inflammatory changes involving the hip/pelvic musculature. No hip fracture or other acute bony abnormality. Findings could be due to a flare up of the patient's rheumatoid arthritis, posttraumatic synovitis or possibly even septic arthritis in the right clinical setting. 2. No significant findings involving the left femur or left thigh musculature. 3. Bilateral knee joint effusions. Electronically Signed   By: Marijo Sanes M.D.   On:  06/02/2018 13:31   Mr Hip Left Wo Contrast  Result Date: 06/02/2018 CLINICAL DATA:  One day history of acute nontraumatic left hip and knee pain. Patient has a history of a right total hip arthroplasty and history of rheumatoid arthritis. EXAM: MR OF THE LEFT HIP WITHOUT CONTRAST TECHNIQUE: Multiplanar, multisequence MR imaging was performed. No intravenous contrast was administered. COMPARISON:  CT scan from earlier today. FINDINGS: A right hip prosthesis is noted. No obvious complicating features. The left hip demonstrates moderate degenerative changes with joint space narrowing, spurring and subchondral cystic change versus erosions. There is a large left hip joint effusion along with marked inflammation around the joint involving the surrounding musculature and extending into the abductor muscles. This could be due to acute flare up of rheumatoid arthritis, posttraumatic synovitis or possibly septic arthritis in the right clinical setting. This certainly would explain the patient's left hip and proximal thigh pain. I do not see a discrete muscle tear or intramuscular hematoma. No fractures are identified. The pubic symphysis and SI joints are intact. Mild fatty atrophy of the gluteal and hip muscles bilaterally. No subcutaneous lesions. No significant intrapelvic abnormalities are identified. No inguinal mass, hernia or adenopathy. The femur is unremarkable. The surrounding thigh musculature appears intact. Bilateral knee joint effusions are noted incidentally and could be related to rheumatoid arthritis also. IMPRESSION: 1. Large left hip joint effusion with significant surrounding inflammatory changes involving the hip/pelvic musculature. No hip fracture or other acute bony abnormality. Findings could be due to a flare up of the patient's rheumatoid arthritis, posttraumatic synovitis or possibly even septic arthritis in the right clinical setting. 2. No significant findings involving the left femur or left  thigh musculature. 3. Bilateral knee joint effusions. Electronically Signed   By: Marijo Sanes M.D.   On: 06/02/2018 13:31   Dg Femur Min 2 Views Left  Result Date: 06/02/2018 CLINICAL DATA:  Pain after walking EXAM: LEFT FEMUR 2 VIEWS COMPARISON:  None. FINDINGS: Pubic symphysis and rami are intact. No acute displaced fracture or malalignment. Moderate degenerative changes at the left hip. Chondrocalcinosis and degenerative change at the knee. IMPRESSION: 1. No acute osseous abnormality. 2. Moderate arthritis of the left hip Electronically Signed   By: Donavan Foil M.D.   On: 06/02/2018 01:41      Allergies as of 06/03/2018      Reactions   Byetta 10 Mcg Pen [exenatide] Nausea And Vomiting, Other (See Comments)   Patient states this medication made her lose weight.  Medication List    TAKE these medications   acetaminophen 650 MG CR tablet Commonly known as:  TYLENOL Take 650 mg by mouth every 8 (eight) hours as needed for pain.   amLODipine 5 MG tablet Commonly known as:  NORVASC Take 0.5 tablets (2.5 mg total) by mouth daily.   atorvastatin 40 MG tablet Commonly known as:  LIPITOR Take 40 mg by mouth daily at 6 PM.   docusate sodium 100 MG capsule Commonly known as:  COLACE Take 1 capsule (100 mg total) by mouth 2 (two) times daily.   ferrous sulfate 325 (65 FE) MG tablet Take 325 mg by mouth daily with breakfast.   glimepiride 1 MG tablet Commonly known as:  AMARYL Take 1 mg by mouth daily with breakfast.   Levothyroxine Sodium 150 MCG Caps Take 137 mcg by mouth daily before breakfast.   magnesium oxide 400 MG tablet Commonly known as:  MAG-OX Take 400 mg by mouth daily.   Oxycodone HCl 10 MG Tabs Take 10 mg by mouth every 6 (six) hours as needed for pain.   polyethylene glycol packet Commonly known as:  MIRALAX / GLYCOLAX Take 17 g by mouth daily.   predniSONE 5 MG tablet Commonly known as:  DELTASONE Take 5 mg by mouth every morning. What changed:   Another medication with the same name was added. Make sure you understand how and when to take each.   predniSONE 10 MG tablet Commonly known as:  DELTASONE Take 1 tablet (10 mg total) by mouth daily with breakfast. 50 mg PO (ORAL)  x 2 days 40 mg PO (ORAL)  x 2 days 30 mg PO  (ORAL)  x 2 days 20 mg PO  (ORAL) x 2 days 10 mg PO  (ORAL) x 2 days then  Resume 5 mg daily What changed:  You were already taking a medication with the same name, and this prescription was added. Make sure you understand how and when to take each.   sertraline 100 MG tablet Commonly known as:  ZOLOFT Take 100 mg by mouth daily.   vitamin B-12 1000 MCG tablet Commonly known as:  CYANOCOBALAMIN Take 1,000 mcg by mouth every morning.   Vitamin D3 1000 units Caps Take 2,000 Units by mouth every morning.          Management plans discussed with the patient and patient's son and she is in agreement. Stable for discharge home with home health  Patient should follow up with Dr. Rudene Christians  CODE STATUS:     Code Status Orders  (From admission, onward)        Start     Ordered   06/02/18 0503  Full code  Continuous     06/02/18 0502    Code Status History    Date Active Date Inactive Code Status Order ID Comments User Context   07/02/2016 1420 07/04/2016 1800 Full Code [id number]  Idelle Crouch, MD Inpatient      TOTAL TIME TAKING CARE OF THIS PATIENT: 36 minutes.    Note: This dictation was prepared with Dragon dictation along with smaller phrase technology. Any transcriptional errors that result from this process are unintentional.  Tinslee Klare M.D on 06/03/2018 at 12:03 PM  Between 7am to 6pm - Pager - [phone] After 6pm go to [email or web address] - password [email or web address] [facility]  Office  [phone]  CC: Primary care physician; Ballard Russell, MD

## 2018-06-03 NOTE — Progress Notes (Signed)
Pt discharged home today with son, no complaints, still some slight discomfort with left leg, but much improved since yesterday, prescriptions, and follow up information provided.

## 2018-06-03 NOTE — Plan of Care (Signed)
  Problem: Pain Managment: Goal: General experience of comfort will improve Outcome: Progressing   

## 2018-06-18 DIAGNOSIS — M25562 Pain in left knee: Secondary | ICD-10-CM | POA: Insufficient documentation

## 2018-06-18 DIAGNOSIS — G8929 Other chronic pain: Secondary | ICD-10-CM | POA: Insufficient documentation

## 2018-08-29 ENCOUNTER — Other Ambulatory Visit: Payer: Self-pay

## 2018-08-29 ENCOUNTER — Emergency Department
Admission: EM | Admit: 2018-08-29 | Discharge: 2018-08-29 | Disposition: A | Discharge status: Other Acute Inpatient Hospital | Payer: Medicare Other | Attending: Emergency Medicine | Admitting: Emergency Medicine

## 2018-08-29 ENCOUNTER — Encounter: Payer: Self-pay | Admitting: Emergency Medicine

## 2018-08-29 ENCOUNTER — Emergency Department: Payer: Medicare Other

## 2018-08-29 DIAGNOSIS — S79912A Unspecified injury of left hip, initial encounter: Secondary | ICD-10-CM | POA: Diagnosis present

## 2018-08-29 DIAGNOSIS — W19XXXA Unspecified fall, initial encounter: Secondary | ICD-10-CM

## 2018-08-29 DIAGNOSIS — Y929 Unspecified place or not applicable: Secondary | ICD-10-CM | POA: Diagnosis not present

## 2018-08-29 DIAGNOSIS — W010XXA Fall on same level from slipping, tripping and stumbling without subsequent striking against object, initial encounter: Secondary | ICD-10-CM | POA: Diagnosis not present

## 2018-08-29 DIAGNOSIS — S72145A Nondisplaced intertrochanteric fracture of left femur, initial encounter for closed fracture: Secondary | ICD-10-CM | POA: Diagnosis not present

## 2018-08-29 DIAGNOSIS — Y999 Unspecified external cause status: Secondary | ICD-10-CM | POA: Diagnosis not present

## 2018-08-29 DIAGNOSIS — Z79899 Other long term (current) drug therapy: Secondary | ICD-10-CM | POA: Diagnosis not present

## 2018-08-29 DIAGNOSIS — E119 Type 2 diabetes mellitus without complications: Secondary | ICD-10-CM | POA: Diagnosis not present

## 2018-08-29 DIAGNOSIS — N185 Chronic kidney disease, stage 5: Secondary | ICD-10-CM | POA: Insufficient documentation

## 2018-08-29 DIAGNOSIS — I120 Hypertensive chronic kidney disease with stage 5 chronic kidney disease or end stage renal disease: Secondary | ICD-10-CM | POA: Diagnosis not present

## 2018-08-29 DIAGNOSIS — Y939 Activity, unspecified: Secondary | ICD-10-CM | POA: Diagnosis not present

## 2018-08-29 DIAGNOSIS — S72142A Displaced intertrochanteric fracture of left femur, initial encounter for closed fracture: Secondary | ICD-10-CM

## 2018-08-29 HISTORY — DX: Unspecified osteoarthritis, unspecified site: M19.90

## 2018-08-29 LAB — CBC
HCT: 35.7 % (ref 35.0–47.0)
Hemoglobin: 12.2 g/dL (ref 12.0–16.0)
MCH: 30 pg (ref 26.0–34.0)
MCHC: 34.1 g/dL (ref 32.0–36.0)
MCV: 88.2 fL (ref 80.0–100.0)
PLATELETS: 197 10*3/uL (ref 150–440)
RBC: 4.05 MIL/uL (ref 3.80–5.20)
RDW: 15.2 % — AB (ref 11.5–14.5)
WBC: 15.7 10*3/uL — ABNORMAL HIGH (ref 3.6–11.0)

## 2018-08-29 LAB — URINALYSIS, COMPLETE (UACMP) WITH MICROSCOPIC
BILIRUBIN URINE: NEGATIVE
Bacteria, UA: NONE SEEN
Glucose, UA: NEGATIVE mg/dL
Ketones, ur: NEGATIVE mg/dL
Nitrite: NEGATIVE
PROTEIN: 100 mg/dL — AB
Specific Gravity, Urine: 1.011 (ref 1.005–1.030)
WBC, UA: >50 WBC/hpf — ABNORMAL HIGH (ref 0–5)
pH: 5 (ref 5.0–8.0)

## 2018-08-29 LAB — HEPATIC FUNCTION PANEL
ALBUMIN: 4.1 g/dL (ref 3.5–5.0)
ALK PHOS: 73 U/L (ref 38–126)
ALT: 21 U/L (ref 0–44)
AST: 22 U/L (ref 15–41)
BILIRUBIN TOTAL: 0.8 mg/dL (ref 0.3–1.2)
Bilirubin, Direct: 0.1 mg/dL (ref 0.0–0.2)
Indirect Bilirubin: 0.7 mg/dL (ref 0.3–0.9)
Total Protein: 6.9 g/dL (ref 6.5–8.1)

## 2018-08-29 LAB — BASIC METABOLIC PANEL
Anion gap: 12 (ref 5–15)
BUN: 58 mg/dL — AB (ref 8–23)
CALCIUM: 9.7 mg/dL (ref 8.9–10.3)
CO2: 25 mmol/L (ref 22–32)
CREATININE: 3.4 mg/dL — AB (ref 0.44–1.00)
Chloride: 102 mmol/L (ref 98–111)
GFR calc Af Amer: 14 mL/min — ABNORMAL LOW (ref >60)
GFR, EST NON AFRICAN AMERICAN: 12 mL/min — AB (ref >60)
GLUCOSE: 227 mg/dL — AB (ref 70–99)
Potassium: 4 mmol/L (ref 3.5–5.1)
SODIUM: 139 mmol/L (ref 135–145)

## 2018-08-29 LAB — TYPE AND SCREEN
ABO/RH(D): AB POS
Antibody Screen: NEGATIVE

## 2018-08-29 LAB — PROTIME-INR
INR: 1.05
PROTHROMBIN TIME: 13.6 s (ref 11.4–15.2)

## 2018-08-29 LAB — CK: Total CK: 120 U/L (ref 38–234)

## 2018-08-29 LAB — APTT: aPTT: 25 seconds (ref 24–36)

## 2018-08-29 MED ORDER — FENTANYL CITRATE (PF) 100 MCG/2ML IJ SOLN
25.0000 ug | Freq: Once | INTRAMUSCULAR | Status: AC
  Administered 2018-08-29: 25 ug via INTRAVENOUS
  Filled 2018-08-29: qty 2

## 2018-08-29 MED ORDER — HYDROMORPHONE HCL 1 MG/ML IJ SOLN
1.0000 mg | Freq: Once | INTRAMUSCULAR | Status: AC
  Administered 2018-08-29: 1 mg via INTRAVENOUS
  Filled 2018-08-29: qty 1

## 2018-08-29 MED ORDER — FENTANYL CITRATE (PF) 100 MCG/2ML IJ SOLN
75.0000 ug | Freq: Once | INTRAMUSCULAR | Status: AC
Start: 2018-08-29 — End: 2018-08-29
  Administered 2018-08-29: 75 ug via INTRAVENOUS
  Filled 2018-08-29: qty 2

## 2018-08-29 MED ORDER — ONDANSETRON HCL 4 MG/2ML IJ SOLN
4.0000 mg | Freq: Once | INTRAMUSCULAR | Status: AC
  Administered 2018-08-29: 4 mg via INTRAVENOUS
  Filled 2018-08-29: qty 2

## 2018-08-29 MED ORDER — HYDROMORPHONE HCL 1 MG/ML IJ SOLN
0.5000 mg | Freq: Once | INTRAMUSCULAR | Status: AC
  Administered 2018-08-29: 0.5 mg via INTRAVENOUS
  Filled 2018-08-29: qty 1

## 2018-08-29 MED ORDER — SODIUM CHLORIDE 0.9 % IV BOLUS
1000.0000 mL | Freq: Once | INTRAVENOUS | Status: AC
  Administered 2018-08-29: 1000 mL via INTRAVENOUS

## 2018-08-29 NOTE — ED Notes (Signed)
EDP in room to update family that pt has been accepted to [facility].

## 2018-08-29 NOTE — ED Triage Notes (Signed)
[place] [facility] this morning in living room.  Patient states she was getting up to go and take a shower and fell.  C/O left hip and knee pain.  Arrives with c/o left hip pain. Left leg externally rotated and shortened.

## 2018-08-29 NOTE — ED Notes (Signed)
Attempted to call report to [facility], spoke with the Charge Nurse was informed that the patient cannot come to the floor until after 7pm due to staffing. Pt may leave [place] at 62 at the earliest per [facility] nurse, [phone], pt going to room 7125.

## 2018-08-29 NOTE — ED Provider Notes (Signed)
[facility] Emergency Department Provider Note  ____________________________________________  Time seen: Approximately 3:51 PM  I have reviewed the triage vital signs and the nursing notes.   HISTORY  Chief Complaint Fall    HPI Christina Mercado is a 82 y.o. female w/ light chain deposition disease, RA, prior R hiparthroplasty presenting w/ L hip pain after fall.  The pt states that at 8:30am, she was getting ready and lost her balance, falling onto her left hip.  She did not strike her head, and denies any neck or back pain.  She did not lose consciousness.  She was unable to get up due to pain in her left hip and laid on the floor for several hours.  She denies any associated chest pain, shortness of breath, palpitations, or recent illness.  The patient denies any numbness or tingling in the left leg.  She has not yet tried anything for pain.   Past Medical History:  Diagnosis Date  . Arthritis   . Diabetes mellitus without complication ([place])   . Hypertension   . Kidney failure    stage 3  . Proteinuria     Patient Active Problem List   Diagnosis Date Noted  . Hip pain, acute 06/03/2018  . CKD (chronic kidney disease), stage V ([place]) 06/02/2018  . Acute pain of left hip 06/02/2018  . Acute pain of left knee 06/02/2018  . 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 Leukocytosis 07/04/2016  . E-coli UTI 07/04/2016  . Essential hypertension, malignant 07/04/2016  . Pyelonephritis 07/02/2016  . Hematuria 07/02/2016  . Abdominal pain 07/02/2016  . Acute on chronic renal failure ([place]) 07/02/2016    Past Surgical History:  Procedure Laterality Date  . RENAL BIOPSY    . TOTAL HIP ARTHROPLASTY Right     Current Outpatient Rx  . Order #: [id number] Class: Historical Med  . Order #: [id number] Class: No Print  . Order #: [id number] Class: Historical Med  . Order #: [id number] Class: Historical Med  . Order #: [id number] Class: Normal  . Order  #: [id number] Class: Historical Med  . Order #: [id number] Class: Historical Med  . Order #: [id number] Class: Historical Med  . Order #: [id number] Class: Historical Med  . Order #: [id number] Class: Historical Med  . Order #: [id number] Class: Historical Med  . Order #: [id number] Class: Print  . Order #: [id number] Class: Historical Med  . Order #: [id number] Class: Historical Med  . Order #: [id number] Class: Historical Med    Allergies Byetta 10 mcg pen [exenatide] and Aspirin  No family history on file.  Social History Social History   Tobacco Use  . Smoking status: Never Smoker  . Smokeless tobacco: Never Used  Substance Use Topics  . Alcohol use: No  . Drug use: Not on file    Review of Systems Constitutional: No fever/chills.  No lightheadedness or syncope.  Positive mechanical fall.  No loss of consciousness. Eyes: No visual changes.  No blurred or double vision. ENT:  No congestion or rhinorrhea. Cardiovascular: Denies chest pain. Denies palpitations. Respiratory: Denies shortness of breath.  No cough. Gastrointestinal: No abdominal pain.  No nausea, no vomiting.  No diarrhea.  No constipation. Genitourinary: Negative for dysuria. Musculoskeletal: Negative for back pain.  Negative for neck pain.  Positive for left hip pain.  Denies any other extremity pain. Skin: Negative for rash. Neurological: Negative for headaches. No focal numbness, tingling or weakness.     ____________________________________________   PHYSICAL EXAM:  VITAL SIGNS: ED Triage Vitals  Enc Vitals  Group     BP 08/29/18 1418 (!) 167/58     Pulse Rate 08/29/18 1418 68     Resp 08/29/18 1418 16     Temp 08/29/18 1418 97.8 F (36.6 C)     Temp Source 08/29/18 1418 Oral     SpO2 08/29/18 1418 98 %     Weight 08/29/18 1416 210 lb 1.6 oz (95.3 kg)     Height 08/29/18 1416 5\' 6"  (1.676 m)     Head Circumference --      Peak Flow --      Pain Score 08/29/18 1416 8     Pain Loc --      Pain Edu? --       Excl. in [place]? --     Constitutional: Alert and oriented. Answers questions appropriately.  Mildly uncomfortable appearing.  ACS is 15. Eyes: Conjunctivae are normal.  EOMI. No scleral icterus.  No raccoon eyes. Head: Atraumatic.  No battle sign. Nose: No congestion/rhinnorhea.  No swelling over the nose or septal hematoma. Mouth/Throat: Mucous membranes are moist.  No dental injury or malocclusion. Neck: No stridor.  Supple.  No midline C-spine tenderness to palpation, step-offs or deformities.  Full range of motion without pain. Cardiovascular: Normal rate, regular rhythm. No murmurs, rubs or gallops.  Respiratory: Normal respiratory effort.  No accessory muscle use or retractions. Lungs CTAB.  No wheezes, rales or ronchi. Gastrointestinal: Obese.  Soft, nontender and nondistended.  No guarding or rebound.  No peritoneal signs. Musculoskeletal: Pelvis is stable.  The patient has full range of motion of the bilateral ankles, right knee, right hip.  The left knee has no evidence of effusion but movement is limited due to left hip pain.  The patient does have tenderness over the left greater trochanter, and shortening with external rotation of the left leg.  She has normal sensation to light touch in the bilateral lower extremities with normal DP and PT pulses bilaterally.  Cap refill is less than 2 seconds.  No LE edema. No ttp in the calves or palpable cords.  Negative Homan's sign. Neurologic:  A&Ox3.  Speech is clear.  Face and smile are symmetric.  EOMI.  Moves all extremities well. Skin:  Skin is warm, dry and intact. No rash noted. Psychiatric: Mood and affect are normal.   ____________________________________________   LABS (all labs ordered are listed, but only abnormal results are displayed)  Labs Reviewed  CBC  BASIC METABOLIC PANEL  URINALYSIS, COMPLETE (UACMP) WITH MICROSCOPIC  PROTIME-INR  APTT  HEPATIC FUNCTION PANEL  CK  TYPE AND SCREEN    ____________________________________________  EKG  ED ECG REPORT I, Anne-Caroline Mariea Clonts, the attending physician, personally viewed and interpreted this ECG.   Date: 08/29/2018  EKG Time: 1600  Rate: 77  Rhythm: normal sinus rhythm  Axis: leftward  Intervals:none  ST&T Change: No STEMI  ____________________________________________  RADIOLOGY  Dg Chest 1 View  Result Date: 08/29/2018 CLINICAL DATA:  Pain following fall EXAM: CHEST  1 VIEW COMPARISON:  None. FINDINGS: Lungs are clear. Heart size and pulmonary vascularity are normal. No adenopathy. There is aortic atherosclerosis. No pneumothorax. No bone lesions. IMPRESSION: No or consolidation. No pneumothorax. There is aortic atherosclerosis. Aortic Atherosclerosis (ICD10-I70.0). Electronically Signed   By: Lowella Grip III M.D.   On: 08/29/2018 15:03   Dg Hip Unilat With Pelvis 2-3 Views Left  Result Date: 08/29/2018 CLINICAL DATA:  Pain following fall EXAM: DG HIP (WITH OR WITHOUT PELVIS) 2-3V LEFT COMPARISON:  CT pelvis June 02, 2018 FINDINGS: Frontal pelvis as well as frontal and lateral left hip images were obtained. There is a comminuted intertrochanteric femur fracture on the left with varus angulation and impaction at the fracture site. There are fragmented greater and lesser trochanters on the left. There is a total hip replacement on the right. No fracture seen on the right, although there is mild lucency in the acetabular component region of the prosthesis, stable from prior CT. Bones are osteoporotic. No evident dislocation. There is osteoarthritic change in each sacroiliac joint. There is degenerative change in the visualized lower lumbar spine. IMPRESSION: 1. Comminuted intertrochanteric femur fracture on the left with varus angulation and impaction fracture site. No other fractures are evident. 2.  Bones diffusely osteoporotic. 3. Total hip replacement on the right. Mild lucency in the acetabular component region is  stable compared to prior CT. A degree of loosening in this area cannot be excluded radiographically. 4. Degenerative type change in the sacroiliac joints and lower lumbar regions. Electronically Signed   By: Lowella Grip III M.D.   On: 08/29/2018 15:02    ____________________________________________   PROCEDURES  Procedure(s) performed: None  Procedures  Critical Care performed: No ____________________________________________   INITIAL IMPRESSION / ASSESSMENT AND PLAN / ED COURSE  Pertinent labs & imaging results that were available during my care of the patient were reviewed by me and considered in my medical decision making (see chart for details).  82 y.o. email, not anticoagulated, presenting with left hip pain after fall.  Overall, the patient is hemodynamically stable.  She has no evidence of additional injuries.  Her x-ray does show comminuted left intertrochanteric fracture with impaction.  There is a prior right hip arthroplasty with some lucency in the acetabular area which may be consistent with prior CT but disruption of the hardware there cannot be excluded.  The patient describes a mechanical fall without any history that would be suggestive of syncopal episode.  We will get a screening EKG with basic laboratory studies, I will treat the patient symptomatically and give her intravenous fluids.  She is n.p.o. at this time and has not had anything to eat or drink since 8:30 AM.  CK is also pending given that she was laying on the ground for several hours.  The patient has all her health care at [facility], and she and her son have requested transfer to [facility] for orthopedic intervention.  I have called the [facility] transfer center and am awaiting a response at this time.  4:38 PM Patient has been accepted for transfer to [facility].  The patient and her son are aware.  ____________________________________________  FINAL CLINICAL IMPRESSION(S) / ED  DIAGNOSES  Final diagnoses:  Closed comminuted intertrochanteric fracture of left femur, initial encounter ([place])  Fall, initial encounter         NEW MEDICATIONS STARTED DURING THIS VISIT:  New Prescriptions   No medications on file      Eula Listen, MD 08/29/18 1639

## 2018-08-29 NOTE — ED Notes (Signed)
Family and patient updated on delay for transfer to [facility]

## 2018-08-29 NOTE — ED Notes (Signed)
EMTALA checked for completion  

## 2018-08-29 NOTE — ED Notes (Signed)
Returned from XR 

## 2018-08-29 NOTE — ED Notes (Signed)
Call to [facility] to give report. Nurse unavailable at this time. Asked to call back in 20 min

## 2018-11-11 DIAGNOSIS — S72142D Displaced intertrochanteric fracture of left femur, subsequent encounter for closed fracture with routine healing: Secondary | ICD-10-CM | POA: Insufficient documentation

## 2018-11-11 DIAGNOSIS — M12812 Other specific arthropathies, not elsewhere classified, left shoulder: Secondary | ICD-10-CM | POA: Insufficient documentation

## 2018-11-11 DIAGNOSIS — S72142S Displaced intertrochanteric fracture of left femur, sequela: Secondary | ICD-10-CM | POA: Insufficient documentation

## 2018-12-20 ENCOUNTER — Other Ambulatory Visit: Payer: Self-pay

## 2018-12-20 ENCOUNTER — Encounter: Payer: Self-pay | Admitting: *Deleted

## 2018-12-20 ENCOUNTER — Emergency Department: Payer: Medicare Other

## 2018-12-20 ENCOUNTER — Emergency Department
Admission: EM | Admit: 2018-12-20 | Discharge: 2018-12-20 | Disposition: A | Discharge status: Home / Self Care | Payer: Medicare Other | Attending: Emergency Medicine | Admitting: Emergency Medicine

## 2018-12-20 DIAGNOSIS — M25561 Pain in right knee: Secondary | ICD-10-CM | POA: Diagnosis present

## 2018-12-20 DIAGNOSIS — E1122 Type 2 diabetes mellitus with diabetic chronic kidney disease: Secondary | ICD-10-CM | POA: Diagnosis not present

## 2018-12-20 DIAGNOSIS — Z7984 Long term (current) use of oral hypoglycemic drugs: Secondary | ICD-10-CM | POA: Diagnosis not present

## 2018-12-20 DIAGNOSIS — Z79899 Other long term (current) drug therapy: Secondary | ICD-10-CM | POA: Insufficient documentation

## 2018-12-20 DIAGNOSIS — N185 Chronic kidney disease, stage 5: Secondary | ICD-10-CM | POA: Diagnosis not present

## 2018-12-20 DIAGNOSIS — Z7901 Long term (current) use of anticoagulants: Secondary | ICD-10-CM | POA: Diagnosis not present

## 2018-12-20 DIAGNOSIS — Z96641 Presence of right artificial hip joint: Secondary | ICD-10-CM | POA: Insufficient documentation

## 2018-12-20 DIAGNOSIS — M25562 Pain in left knee: Secondary | ICD-10-CM | POA: Insufficient documentation

## 2018-12-20 DIAGNOSIS — I120 Hypertensive chronic kidney disease with stage 5 chronic kidney disease or end stage renal disease: Secondary | ICD-10-CM | POA: Diagnosis not present

## 2018-12-20 DIAGNOSIS — M1990 Unspecified osteoarthritis, unspecified site: Secondary | ICD-10-CM | POA: Diagnosis not present

## 2018-12-20 DIAGNOSIS — I82412 Acute embolism and thrombosis of left femoral vein: Secondary | ICD-10-CM | POA: Insufficient documentation

## 2018-12-20 LAB — BASIC METABOLIC PANEL
Anion gap: 10 (ref 5–15)
BUN: 58 mg/dL — AB (ref 8–23)
CALCIUM: 9.3 mg/dL (ref 8.9–10.3)
CHLORIDE: 103 mmol/L (ref 98–111)
CO2: 22 mmol/L (ref 22–32)
CREATININE: 2.92 mg/dL — AB (ref 0.44–1.00)
GFR calc Af Amer: 17 mL/min — ABNORMAL LOW (ref >60)
GFR, EST NON AFRICAN AMERICAN: 15 mL/min — AB (ref >60)
Glucose, Bld: 163 mg/dL — ABNORMAL HIGH (ref 70–99)
Potassium: 3.8 mmol/L (ref 3.5–5.1)
Sodium: 135 mmol/L (ref 135–145)

## 2018-12-20 LAB — CBC WITH DIFFERENTIAL/PLATELET
Abs Immature Granulocytes: 0.07 10*3/uL (ref 0.00–0.07)
BASOS PCT: 0 %
Basophils Absolute: 0 10*3/uL (ref 0.0–0.1)
EOS ABS: 0.1 10*3/uL (ref 0.0–0.5)
EOS PCT: 0 %
HCT: 37.4 % (ref 36.0–46.0)
Hemoglobin: 11.9 g/dL — ABNORMAL LOW (ref 12.0–15.0)
Immature Granulocytes: 1 %
Lymphocytes Relative: 9 %
Lymphs Abs: 1.2 10*3/uL (ref 0.7–4.0)
MCH: 29 pg (ref 26.0–34.0)
MCHC: 31.8 g/dL (ref 30.0–36.0)
MCV: 91.2 fL (ref 80.0–100.0)
MONO ABS: 1.2 10*3/uL — AB (ref 0.1–1.0)
Monocytes Relative: 9 %
Neutro Abs: 11.2 10*3/uL — ABNORMAL HIGH (ref 1.7–7.7)
Neutrophils Relative %: 81 %
PLATELETS: 164 10*3/uL (ref 150–400)
RBC: 4.1 MIL/uL (ref 3.87–5.11)
RDW: 13.7 % (ref 11.5–15.5)
WBC: 13.7 10*3/uL — AB (ref 4.0–10.5)
nRBC: 0 % (ref 0.0–0.2)

## 2018-12-20 LAB — URINALYSIS, COMPLETE (UACMP) WITH MICROSCOPIC
Bilirubin Urine: NEGATIVE
Glucose, UA: NEGATIVE mg/dL
HGB URINE DIPSTICK: NEGATIVE
Ketones, ur: NEGATIVE mg/dL
Nitrite: NEGATIVE
Protein, ur: 30 mg/dL — AB
SPECIFIC GRAVITY, URINE: 1.009 (ref 1.005–1.030)
pH: 6 (ref 5.0–8.0)

## 2018-12-20 MED ORDER — OXYCODONE HCL 5 MG PO TABS: 5.0000 mg | ORAL_TABLET | Freq: Once | ORAL | Status: DC

## 2018-12-20 MED ORDER — ACETAMINOPHEN 500 MG PO TABS: 1000.0000 mg | ORAL_TABLET | Freq: Once | ORAL | Status: DC

## 2018-12-20 MED ORDER — APIXABAN 5 MG PO TABS
5.0000 mg | ORAL_TABLET | Freq: Once | ORAL | Status: DC
  Filled 2018-12-20: qty 1

## 2018-12-20 MED ORDER — OXYCODONE HCL 5 MG PO TABS
10.0000 mg | ORAL_TABLET | Freq: Once | ORAL | Status: AC
  Administered 2018-12-20: 10 mg via ORAL
  Filled 2018-12-20: qty 2

## 2018-12-20 MED ORDER — ELIQUIS 5 MG VTE STARTER PACK: ORAL_TABLET | ORAL | 0 refills | Status: DC

## 2018-12-20 MED ORDER — SODIUM CHLORIDE 0.9 % IV SOLN: 1.0000 g | Freq: Once | INTRAVENOUS | Status: DC

## 2018-12-20 MED ORDER — CEPHALEXIN 500 MG PO CAPS: 500.0000 mg | ORAL_CAPSULE | Freq: Three times a day (TID) | ORAL | 0 refills | Status: AC

## 2018-12-20 MED ORDER — APIXABAN 5 MG PO TABS
10.0000 mg | ORAL_TABLET | Freq: Once | ORAL | Status: AC
  Administered 2018-12-20: 10 mg via ORAL

## 2018-12-20 MED ORDER — FENTANYL CITRATE (PF) 100 MCG/2ML IJ SOLN
50.0000 ug | Freq: Once | INTRAMUSCULAR | Status: AC
  Administered 2018-12-20: 50 ug via INTRAVENOUS
  Filled 2018-12-20: qty 2

## 2018-12-20 MED ORDER — ONDANSETRON HCL 4 MG/2ML IJ SOLN
4.0000 mg | Freq: Once | INTRAMUSCULAR | Status: AC
  Administered 2018-12-20: 4 mg via INTRAVENOUS
  Filled 2018-12-20: qty 2

## 2018-12-20 NOTE — ED Provider Notes (Signed)
[facility] Emergency Department Provider Note  ____________________________________________  Time seen: Approximately 3:33 PM  I have reviewed the triage vital signs and the nursing notes.   HISTORY  Chief Complaint Leg Pain and Knee Pain   HPI Christina Mercado is a 83 y.o. female with history of arthritis, diabetes, hypertension, chronic kidney disease who presents for evaluation of bilateral knee pain.  Patient sustained a fall and had a hip replacement in October 2019.  Since then she has had progressively worsening pain on bilateral knees.  Last night her pain was severe.  She takes oxycodone for it.  The pain is sharp, diffuse, located on both knees, right worse than left, worse with movement or ambulation.  She denies abdominal pain, fever or chills, weakness in her legs, dysuria or hematuria.  Patient reports that since having her surgery her left leg has become shorter than the right which is exacerbating her pain.  The pain is usually worse after physical therapy.  Past Medical History:  Diagnosis Date  . Arthritis   . Diabetes mellitus without complication ([place])   . Hypertension   . Kidney failure    stage 3  . Proteinuria     Patient Active Problem List   Diagnosis Date Noted  . Hip pain, acute 06/03/2018  . CKD (chronic kidney disease), stage V ([place]) 06/02/2018  . Acute pain of left hip 06/02/2018  . Acute pain of left knee 06/02/2018  . 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 Leukocytosis 07/04/2016  . E-coli UTI 07/04/2016  . Essential hypertension, malignant 07/04/2016  . Pyelonephritis 07/02/2016  . Hematuria 07/02/2016  . Abdominal pain 07/02/2016  . Acute on chronic renal failure ([place]) 07/02/2016    Past Surgical History:  Procedure Laterality Date  . RENAL BIOPSY    . TOTAL HIP ARTHROPLASTY Right     Prior to Admission medications   Medication Sig Start Date End Date Taking? Authorizing Provider    acetaminophen (TYLENOL) 650 MG CR tablet Take 650 mg by mouth every 8 (eight) hours as needed for pain.    [provider]  amLODipine (NORVASC) 5 MG tablet Take 0.5 tablets (2.5 mg total) by mouth daily. 06/03/18   Bettey Costa, MD  atorvastatin (LIPITOR) 40 MG tablet Take 40 mg by mouth daily at 6 PM.    [provider]  Cholecalciferol (VITAMIN D3) 1000 units CAPS Take 2,000 Units by mouth every morning.    [provider]  docusate sodium (COLACE) 100 MG capsule Take 1 capsule (100 mg total) by mouth 2 (two) times daily. 07/04/16   Theodoro Grist, MD  ELIQUIS STARTER PACK (ELIQUIS STARTER PACK) 5 MG TABS Take as directed on package: start with two-5mg  tablets twice daily for 7 days. On day 8, switch to one-5mg  tablet twice daily. 12/20/18   Rudene Re, MD  ferrous sulfate 325 (65 FE) MG tablet Take 325 mg by mouth daily with breakfast.    [provider]  glimepiride (AMARYL) 1 MG tablet Take 1 mg by mouth daily with breakfast.    [provider]  Levothyroxine Sodium 150 MCG CAPS Take 137 mcg by mouth daily before breakfast.  06/17/16   [provider]  magnesium oxide (MAG-OX) 400 MG tablet Take 400 mg by mouth daily.    [provider]  Oxycodone HCl 10 MG TABS Take 10 mg by mouth every 6 (six) hours as needed for pain. 06/21/16   [provider]  polyethylene glycol (MIRALAX / GLYCOLAX) packet Take 17 g by mouth daily.    [provider]  predniSONE (DELTASONE) 10 MG tablet Take 1 tablet (10 mg total) by mouth daily with breakfast. 50 mg PO (ORAL)  x 2 days 40 mg PO (ORAL)  x 2 days 30 mg PO  (ORAL)  x 2 days 20 mg PO  (ORAL) x 2 days 10 mg PO  (ORAL) x 2 days then  Resume 5 mg daily 06/03/18   Bettey Costa, MD  predniSONE (DELTASONE) 5 MG tablet Take 5 mg by mouth every morning.  05/15/16   [provider]  sertraline (ZOLOFT) 100 MG tablet Take 100 mg by mouth daily.    [provider]   vitamin B-12 (CYANOCOBALAMIN) 1000 MCG tablet Take 1,000 mcg by mouth every morning.    [provider]    Allergies Byetta 10 mcg pen [exenatide] and Aspirin  History reviewed. No pertinent family history.  Social History Social History   Tobacco Use  . Smoking status: Never Smoker  . Smokeless tobacco: Never Used  Substance Use Topics  . Alcohol use: No  . Drug use: Not on file    Review of Systems  Constitutional: Negative for fever. Eyes: Negative for visual changes. ENT: Negative for sore throat. Neck: No neck pain  Cardiovascular: Negative for chest pain. Respiratory: Negative for shortness of breath. Gastrointestinal: Negative for abdominal pain, vomiting or diarrhea. Genitourinary: Negative for dysuria. Musculoskeletal: Negative for back pain. + b/l knee pain Skin: Negative for rash. Neurological: Negative for headaches, weakness or numbness. Psych: No SI or HI  ____________________________________________   PHYSICAL EXAM:  VITAL SIGNS: ED Triage Vitals [12/20/18 1326]  Enc Vitals Group     BP (!) 157/60     Pulse Rate 77     Resp 14     Temp 99.3 F (37.4 C)     Temp Source Oral     SpO2 98 %     Weight 192 lb 14.4 oz (87.5 kg)     Height      Head Circumference      Peak Flow      Pain Score 9     Pain Loc      Pain Edu?      Excl. in [place]?     Constitutional: Alert and oriented. Well appearing and in no apparent distress. HEENT:      Head: Normocephalic and atraumatic.         Eyes: Conjunctivae are normal. Sclera is non-icteric.       Mouth/Throat: Mucous membranes are moist.       Neck: Supple with no signs of meningismus. Cardiovascular: Regular rate and rhythm. No murmurs, gallops, or rubs. 2+ symmetrical distal pulses are present in all extremities. No JVD. Respiratory: Normal respiratory effort. Lungs are clear to auscultation bilaterally. No wheezes, crackles, or rhonchi.  Gastrointestinal: Soft, non tender, and non  distended with positive bowel sounds. No rebound or guarding. Musculoskeletal: diffuse tenderness to palpation of bilateral knees, no erythema or warmth, limited ROM due to pain but mostly preserve with slow movement. 2+ DP and PT pulses bilaterally, limbs are warm and well perfused, no erythema or edema Neurologic: Normal speech and language. Face is symmetric. Moving all extremities. No gross focal neurologic deficits are appreciated. Skin: Skin is warm, dry and intact. No rash noted. Psychiatric: Mood and affect are normal. Speech and behavior are normal.  ____________________________________________   LABS (all labs ordered are  listed, but only abnormal results are displayed)  Labs Reviewed  CBC WITH DIFFERENTIAL/PLATELET - Abnormal; Notable for the following components:      Result Value   WBC 13.7 (*)    Hemoglobin 11.9 (*)    Neutro Abs 11.2 (*)    Monocytes Absolute 1.2 (*)    All other components within normal limits  BASIC METABOLIC PANEL - Abnormal; Notable for the following components:   Glucose, Bld 163 (*)    BUN 58 (*)    Creatinine, Ser 2.92 (*)    GFR calc non Af Amer 15 (*)    GFR calc Af Amer 17 (*)    All other components within normal limits  URINALYSIS, COMPLETE (UACMP) WITH MICROSCOPIC - Abnormal; Notable for the following components:   Color, Urine YELLOW (*)    APPearance CLEAR (*)    Protein, ur 30 (*)    Leukocytes, UA SMALL (*)    Bacteria, UA RARE (*)    All other components within normal limits  URINE CULTURE   ____________________________________________  EKG  none  ____________________________________________  RADIOLOGY  I have personally reviewed the images performed during this visit and I agree with the Radiologist's read.   Interpretation by Radiologist:  US Venous Img Lower Bilateral  Result Date: 12/20/2018 CLINICAL DATA:  83 year old with leg pain. EXAM: BILATERAL LOWER EXTREMITY VENOUS DOPPLER ULTRASOUND TECHNIQUE: Gray-scale  sonography with graded compression, as well as color Doppler and duplex ultrasound were performed to evaluate the lower extremity deep venous systems from the level of the common femoral vein and including the common femoral, femoral, profunda femoral, popliteal and calf veins including the posterior tibial, peroneal and gastrocnemius veins when visible. The superficial great saphenous vein was also interrogated. Spectral Doppler was utilized to evaluate flow at rest and with distal augmentation maneuvers in the common femoral, femoral and popliteal veins. COMPARISON:  07/03/2016 FINDINGS: RIGHT LOWER EXTREMITY Common Femoral Vein: No evidence of thrombus. Normal compressibility, respiratory phasicity and response to augmentation. Saphenofemoral Junction: No evidence of thrombus. Normal compressibility and flow on color Doppler imaging. Profunda Femoral Vein: No evidence of thrombus. Normal compressibility and flow on color Doppler imaging. Femoral Vein: No evidence of thrombus. Normal compressibility, respiratory phasicity and response to augmentation. Popliteal Vein: No evidence of thrombus. Normal compressibility, respiratory phasicity and response to augmentation. Calf Veins: Visualized right deep calf veins are patent without thrombus. Other Findings:  None. LEFT LOWER EXTREMITY Common Femoral Vein: No evidence of thrombus. Normal compressibility, respiratory phasicity and response to augmentation. Saphenofemoral Junction: No evidence of thrombus. Normal compressibility and flow on color Doppler imaging. Profunda Femoral Vein: No evidence of thrombus. Normal compressibility and flow on color Doppler imaging. Femoral Vein: Positive for thrombus. Non compressible thrombus in the mid and distal femoral vein. Femoral vein thrombus is nonocclusive. Popliteal Vein: Positive for thrombus. Left popliteal vein is non compressible without significant color Doppler flow. This area is difficult to visualize due to body  habitus. Calf Veins: Limited evaluation. Other Findings:  None. IMPRESSION: Positive for deep venous thrombosis in the left lower extremity. Thrombus involving the left popliteal vein and left femoral vein. Negative for deep venous thrombosis in the right lower extremity. Electronically Signed   By: Markus Daft M.D.   On: 12/20/2018 15:15   Dg Knee Complete 4 Views Left  Result Date: 12/20/2018 CLINICAL DATA:  Left knee pain.  Limited range of motion. EXAM: LEFT KNEE - COMPLETE 4+ VIEW COMPARISON:  No recent. FINDINGS: No acute bony  or joint abnormality identified. Severe Tricompartment degenerative change, most prominent about the medial compartment and patellofemoral compartment. Chondrocalcinosis, most likely degenerative, noted. No effusion noted. IMPRESSION: Diffuse tricompartment severe degenerative change. Degenerative changes most prominent about the medial and patellofemoral compartments. Chondrocalcinosis noted. No acute abnormality identified Electronically Signed   By: Marcello Moores  Register   On: 12/20/2018 14:22   Dg Knee Complete 4 Views Right  Result Date: 12/20/2018 CLINICAL DATA:  Chronic right knee pain. EXAM: RIGHT KNEE - COMPLETE 4+ VIEW COMPARISON:  None. FINDINGS: No acute fracture or dislocation. Small suprapatellar joint effusion. Moderate to severe medial patellofemoral compartment joint space narrowing with marginal osteophytes. Osteopenia. Soft tissues are unremarkable. IMPRESSION: 1. Moderate to severe medial and patellofemoral compartment osteoarthritis. Electronically Signed   By: Titus Dubin M.D.   On: 12/20/2018 14:23     ____________________________________________   PROCEDURES  Procedure(s) performed: None Procedures Critical Care performed:  None ____________________________________________   INITIAL IMPRESSION / ASSESSMENT AND PLAN / ED COURSE  83 y.o. female with history of arthritis, diabetes, hypertension, chronic kidney disease who presents for evaluation  of several months of progressively worsening bilateral knee pain.  Ddx arthritis vs DVT.  Patient has diffuse tenderness palpation of both knees.  No clinical signs or symptoms of septic joint, ischemic limb, cellulitis, fracture or dislocation.  X-ray showing moderate to severe osteoarthritis.  Doppler ultrasound positive for left femoral and popliteal vein DVT.  This is the leg and the patient had a hip replacement.  She has no chest pain, no shortness of breath, no tachycardia, no hypoxia, no tachypnea and therefore no clinical signs of PE.  Will start patient on Eliquis.  Discussed strict return precautions and close follow-up with primary care doctor.  UA is positive for bacteria and leuks with a mildly elevated white count of 13.7.  Daughter is at the bedside and reports the patient's urine is always positive and requested that we hold off treatment at this time until culture is back.  Patient is asymptomatic with no signs of sepsis at this time.  Culture is pending.      As part of my medical decision making, I reviewed the following data within the [place] History obtained from family, Nursing notes reviewed and incorporated, Labs reviewed , Old chart reviewed, Radiograph reviewed , Notes from prior ED visits and [place] Controlled Substance Database    Pertinent labs & imaging results that were available during my care of the patient were reviewed by me and considered in my medical decision making (see chart for details).    ____________________________________________   FINAL CLINICAL IMPRESSION(S) / ED DIAGNOSES  Final diagnoses:  Acute deep vein thrombosis (DVT) of femoral vein of left lower extremity (HCC)  Arthritis      NEW MEDICATIONS STARTED DURING THIS VISIT:  ED Discharge Orders         Ordered    ELIQUIS STARTER PACK (ELIQUIS STARTER PACK) 5 MG TABS     12/20/18 1534           Note:  This document was prepared using Dragon voice recognition  software and may include unintentional dictation errors.    Alfred Levins, [place], MD 12/20/18 1539

## 2018-12-20 NOTE — ED Triage Notes (Signed)
Pt to Ed reporting bilateral leg pain with significant tenderness when legs and knees are touched by this RN./ PT reports both legs are swollen but one is no more swollen than the other. Color is appropriate and pedal pulse intact. Pt abler to move both legs and no weakness noted unilaterally.

## 2018-12-20 NOTE — Discharge Instructions (Addendum)
Your ultrasound was positive for acute DVT in your left leg.  Take Eliquis as prescribed (10mg  twice a day for the first 7 days and then 5mg  twice a day until your doctor tells you to stop).  Follow-up with your doctor in 3 days.  Return to the emergency room if you have any chest pain or shortness of breath.  Your x-rays were also positive for severe arthritis.  Follow-up with your orthopedic surgeon for that.  Take your oxycodone for pain.

## 2018-12-20 NOTE — ED Notes (Signed)
Delay in discharge. Pt left the unit at 1600. NAD and family all agrees that this is safe to take pt home. Pt having trouble bearing weight but options discussed in length with patient and family and all agree pt can be taken home by family and is safe over the weekend.

## 2018-12-20 NOTE — ED Notes (Signed)
Patient transported to Ultrasound 

## 2018-12-20 NOTE — ED Notes (Signed)
Medications given in [place]

## 2018-12-24 LAB — URINE CULTURE: Culture: >=100000 — AB

## 2019-02-26 ENCOUNTER — Emergency Department: Payer: Medicare Other

## 2019-02-26 ENCOUNTER — Emergency Department
Admission: EM | Admit: 2019-02-26 | Discharge: 2019-02-26 | Disposition: A | Discharge status: Home / Self Care | Payer: Medicare Other | Attending: Emergency Medicine | Admitting: Emergency Medicine

## 2019-02-26 ENCOUNTER — Encounter: Payer: Self-pay | Admitting: Emergency Medicine

## 2019-02-26 ENCOUNTER — Other Ambulatory Visit: Payer: Self-pay

## 2019-02-26 DIAGNOSIS — Y999 Unspecified external cause status: Secondary | ICD-10-CM | POA: Diagnosis not present

## 2019-02-26 DIAGNOSIS — Z7901 Long term (current) use of anticoagulants: Secondary | ICD-10-CM | POA: Insufficient documentation

## 2019-02-26 DIAGNOSIS — Z96641 Presence of right artificial hip joint: Secondary | ICD-10-CM | POA: Insufficient documentation

## 2019-02-26 DIAGNOSIS — S0101XA Laceration without foreign body of scalp, initial encounter: Secondary | ICD-10-CM | POA: Diagnosis not present

## 2019-02-26 DIAGNOSIS — Y92129 Unspecified place in nursing home as the place of occurrence of the external cause: Secondary | ICD-10-CM | POA: Diagnosis not present

## 2019-02-26 DIAGNOSIS — I129 Hypertensive chronic kidney disease with stage 1 through stage 4 chronic kidney disease, or unspecified chronic kidney disease: Secondary | ICD-10-CM | POA: Insufficient documentation

## 2019-02-26 DIAGNOSIS — Y939 Activity, unspecified: Secondary | ICD-10-CM | POA: Diagnosis not present

## 2019-02-26 DIAGNOSIS — N184 Chronic kidney disease, stage 4 (severe): Secondary | ICD-10-CM | POA: Insufficient documentation

## 2019-02-26 DIAGNOSIS — E1122 Type 2 diabetes mellitus with diabetic chronic kidney disease: Secondary | ICD-10-CM | POA: Diagnosis not present

## 2019-02-26 DIAGNOSIS — W01198A Fall on same level from slipping, tripping and stumbling with subsequent striking against other object, initial encounter: Secondary | ICD-10-CM | POA: Insufficient documentation

## 2019-02-26 DIAGNOSIS — Z79899 Other long term (current) drug therapy: Secondary | ICD-10-CM | POA: Diagnosis not present

## 2019-02-26 DIAGNOSIS — S098XXA Other specified injuries of head, initial encounter: Secondary | ICD-10-CM | POA: Diagnosis present

## 2019-02-26 DIAGNOSIS — W19XXXA Unspecified fall, initial encounter: Secondary | ICD-10-CM

## 2019-02-26 DIAGNOSIS — S51011A Laceration without foreign body of right elbow, initial encounter: Secondary | ICD-10-CM | POA: Insufficient documentation

## 2019-02-26 MED ORDER — LIDOCAINE HCL (PF) 1 % IJ SOLN
5.0000 mL | Freq: Once | INTRAMUSCULAR | Status: AC
  Administered 2019-02-26: 5 mL
  Filled 2019-02-26: qty 5

## 2019-02-26 NOTE — ED Notes (Signed)
Patient transported to CT 

## 2019-02-26 NOTE — Discharge Instructions (Addendum)
Please seek medical attention for any high fevers, chest pain, shortness of breath, change in behavior, persistent vomiting, bloody stool or any other new or concerning symptoms.  

## 2019-02-26 NOTE — ED Provider Notes (Signed)
[facility] Emergency Department Provider Note  _________________________________________   I have reviewed the triage vital signs and the nursing notes.   HISTORY  Chief Complaint Fall   History limited by: Not Limited   HPI Christina Mercado is a 83 y.o. female who presents to the emergency department today after suffering a fall. The patient states that she tripped. Fell onto her right side. Hit her head against a doorknob. Had a small amount of pain to her head and then her buttocks. She was able to get up with assistance after the fall. She denies any lightheadedness, dizziness, chest pain or palpitations prior to the fall. She denies any recent illness. States she does take blood thinners.   Records reviewed. Per medical record review patient has a history of chronic kidney disease.   Past Medical History:  Diagnosis Date  . Arthritis   . Diabetes mellitus without complication ([place])   . Hypertension   . Kidney failure    stage 3  . Proteinuria     Patient Active Problem List   Diagnosis Date Noted  . Hip pain, acute 06/03/2018  . CKD (chronic kidney disease), stage V ([place]) 06/02/2018  . Acute pain of left hip 06/02/2018  . Acute pain of left knee 06/02/2018  . 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 Leukocytosis 07/04/2016  . E-coli UTI 07/04/2016  . Essential hypertension, malignant 07/04/2016  . Pyelonephritis 07/02/2016  . Hematuria 07/02/2016  . Abdominal pain 07/02/2016  . Acute on chronic renal failure ([place]) 07/02/2016    Past Surgical History:  Procedure Laterality Date  . RENAL BIOPSY    . TOTAL HIP ARTHROPLASTY Right     Prior to Admission medications   Medication Sig Start Date End Date Taking? Authorizing Provider  acetaminophen (TYLENOL) 650 MG CR tablet Take 650 mg by mouth every 8 (eight) hours as needed for pain.    [provider]  amLODipine (NORVASC) 5 MG tablet Take 0.5 tablets (2.5 mg  total) by mouth daily. 06/03/18   Bettey Costa, MD  atorvastatin (LIPITOR) 40 MG tablet Take 40 mg by mouth daily at 6 PM.    [provider]  Cholecalciferol (VITAMIN D3) 1000 units CAPS Take 2,000 Units by mouth every morning.    [provider]  docusate sodium (COLACE) 100 MG capsule Take 1 capsule (100 mg total) by mouth 2 (two) times daily. 07/04/16   Theodoro Grist, MD  ELIQUIS STARTER PACK (ELIQUIS STARTER PACK) 5 MG TABS Take as directed on package: start with two-5mg  tablets twice daily for 7 days. On day 8, switch to one-5mg  tablet twice daily. 12/20/18   Rudene Re, MD  ferrous sulfate 325 (65 FE) MG tablet Take 325 mg by mouth daily with breakfast.    [provider]  glimepiride (AMARYL) 1 MG tablet Take 1 mg by mouth daily with breakfast.    [provider]  Levothyroxine Sodium 150 MCG CAPS Take 137 mcg by mouth daily before breakfast.  06/17/16   [provider]  magnesium oxide (MAG-OX) 400 MG tablet Take 400 mg by mouth daily.    [provider]  Oxycodone HCl 10 MG TABS Take 10 mg by mouth every 6 (six) hours as needed for pain. 06/21/16   [provider]  polyethylene glycol (MIRALAX / GLYCOLAX) packet Take 17 g by mouth daily.    [provider]  predniSONE (DELTASONE) 10 MG tablet Take 1 tablet (10 mg total) by  mouth daily with breakfast. 50 mg PO (ORAL)  x 2 days 40 mg PO (ORAL)  x 2 days 30 mg PO  (ORAL)  x 2 days 20 mg PO  (ORAL) x 2 days 10 mg PO  (ORAL) x 2 days then  Resume 5 mg daily 06/03/18   Bettey Costa, MD  predniSONE (DELTASONE) 5 MG tablet Take 5 mg by mouth every morning.  05/15/16   [provider]  sertraline (ZOLOFT) 100 MG tablet Take 100 mg by mouth daily.    [provider]  vitamin B-12 (CYANOCOBALAMIN) 1000 MCG tablet Take 1,000 mcg by mouth every morning.    [provider]    Allergies Byetta 10 mcg pen [exenatide] and Aspirin  No family history on  file.  Social History Social History   Tobacco Use  . Smoking status: Never Smoker  . Smokeless tobacco: Never Used  Substance Use Topics  . Alcohol use: No  . Drug use: Never    Review of Systems Constitutional: No fever/chills Eyes: No visual changes. ENT: No sore throat. Cardiovascular: Denies chest pain. Respiratory: Denies shortness of breath. Gastrointestinal: No abdominal pain.  No nausea, no vomiting.  No diarrhea.   Genitourinary: Negative for dysuria. Musculoskeletal: Positive for buttock pain.  Skin: Positive for cut to her head. Neurological: Negative for headaches, focal weakness or numbness.  ____________________________________________   PHYSICAL EXAM:  VITAL SIGNS: ED Triage Vitals  Enc Vitals Group     BP 02/26/19 1441 117/79     Pulse Rate 02/26/19 1441 74     Resp 02/26/19 1441 16     Temp 02/26/19 1441 98.1 F (36.7 C)     Temp Source 02/26/19 1441 Oral     SpO2 02/26/19 1441 100 %     Weight 02/26/19 1442 180 lb (81.6 kg)     Height 02/26/19 1442 5\' 5"  (1.651 m)     Head Circumference --      Peak Flow --      Pain Score 02/26/19 1442 0   Constitutional: Alert and oriented.  Eyes: Conjunctivae are normal.  ENT      Head: Normocephalic. Small roughly 1.5 cm laceration to right temporal scalp.      Nose: No congestion/rhinnorhea.      Mouth/Throat: Mucous membranes are moist.      Neck: No stridor. No midline tenderness.  Hematological/Lymphatic/Immunilogical: No cervical lymphadenopathy. Cardiovascular: Normal rate, regular rhythm.  No murmurs, rubs, or gallops. Respiratory: Normal respiratory effort without tachypnea nor retractions. Breath sounds are clear and equal bilaterally. No wheezes/rales/rhonchi. Gastrointestinal: Soft and non tender. No rebound. No guarding.  Genitourinary: Deferred Musculoskeletal: Normal range of motion in all extremities. No lower extremity edema. No tenderness to palpation or manipulation of the hips.   Neurologic:  Normal speech and language. No gross focal neurologic deficits are appreciated.  Skin:  Small skin tear noted to right elbow and right hand. Laceration to right temporal scalp. Psychiatric: Mood and affect are normal. Speech and behavior are normal. Patient exhibits appropriate insight and judgment.  ____________________________________________    LABS (pertinent positives/negatives)  None  ____________________________________________   EKG  None  ____________________________________________    RADIOLOGY  CT head/cervical spine No acute abnormality  ____________________________________________   PROCEDURES  Procedures  LACERATION REPAIR Performed by: Nance Pear Authorized by: Nance Pear Consent: Verbal consent obtained. Risks and benefits: risks, benefits and alternatives were discussed Consent given by: patient Patient identity confirmed: provided demographic data Prepped and Draped in normal  sterile fashion Wound explored  Laceration Location: right temporal scalp  Laceration Length: 1.5 cm  No Foreign Bodies seen or palpated  Anesthesia: local infiltration  Local anesthetic: lidocaine 1% without epinephrine  Anesthetic total: 2 ml  Irrigation method: syringe Amount of cleaning: standard  Skin closure: staples  Number of staples: 3  Patient tolerance: Patient tolerated the procedure well with no immediate complications.  ____________________________________________   INITIAL IMPRESSION / ASSESSMENT AND PLAN / ED COURSE  Pertinent labs & imaging results that were available during my care of the patient were reviewed by me and considered in my medical decision making (see chart for details).   Patient presented after a mechanical fall. Did suffer a laceration to her right temporal scalp. Head and cervical spine ct were performed without any abnormal findings. The patient did have the laceration cleaned and closed with  staples. Also with skin tears to right elbow and right hand, will have nursing staff apply steristrips.   ____________________________________________   FINAL CLINICAL IMPRESSION(S) / ED DIAGNOSES  Final diagnoses:  Fall, initial encounter  Laceration of scalp, initial encounter  Skin tear of right elbow without complication, initial encounter     Note: This dictation was prepared with Dragon dictation. Any transcriptional errors that result from this process are unintentional     Nance Pear, MD 02/26/19 [phone]

## 2019-02-26 NOTE — ED Triage Notes (Signed)
Pt in via [facility] from [facility], reports witnessed mechanical fall with walker, hitting head on door knob.  Pt denies LOC, does report taking Eliquis.  Pt with laceration to posterior head, bleeding controlled at this time.  Pt A/Ox4, vitals WDL, NAD noted at this time.

## 2020-05-11 DIAGNOSIS — F3341 Major depressive disorder, recurrent, in partial remission: Secondary | ICD-10-CM | POA: Insufficient documentation

## 2020-06-18 ENCOUNTER — Emergency Department: Payer: Medicare Other

## 2020-06-18 ENCOUNTER — Other Ambulatory Visit: Payer: Self-pay

## 2020-06-18 ENCOUNTER — Emergency Department
Admission: EM | Admit: 2020-06-18 | Discharge: 2020-06-18 | Disposition: A | Discharge status: Home / Self Care | Payer: Medicare Other | Attending: Student in an Organized Health Care Education/Training Program | Admitting: Student in an Organized Health Care Education/Training Program

## 2020-06-18 DIAGNOSIS — Z96641 Presence of right artificial hip joint: Secondary | ICD-10-CM | POA: Diagnosis not present

## 2020-06-18 DIAGNOSIS — M7989 Other specified soft tissue disorders: Secondary | ICD-10-CM

## 2020-06-18 DIAGNOSIS — L299 Pruritus, unspecified: Secondary | ICD-10-CM | POA: Insufficient documentation

## 2020-06-18 DIAGNOSIS — I120 Hypertensive chronic kidney disease with stage 5 chronic kidney disease or end stage renal disease: Secondary | ICD-10-CM | POA: Diagnosis not present

## 2020-06-18 DIAGNOSIS — Z7901 Long term (current) use of anticoagulants: Secondary | ICD-10-CM | POA: Insufficient documentation

## 2020-06-18 DIAGNOSIS — Z79899 Other long term (current) drug therapy: Secondary | ICD-10-CM | POA: Insufficient documentation

## 2020-06-18 DIAGNOSIS — E119 Type 2 diabetes mellitus without complications: Secondary | ICD-10-CM | POA: Diagnosis not present

## 2020-06-18 DIAGNOSIS — N185 Chronic kidney disease, stage 5: Secondary | ICD-10-CM | POA: Diagnosis not present

## 2020-06-18 DIAGNOSIS — R2243 Localized swelling, mass and lump, lower limb, bilateral: Secondary | ICD-10-CM | POA: Insufficient documentation

## 2020-06-18 LAB — CBC
HCT: 30.7 % — ABNORMAL LOW (ref 36.0–46.0)
Hemoglobin: 10 g/dL — ABNORMAL LOW (ref 12.0–15.0)
MCH: 30.7 pg (ref 26.0–34.0)
MCHC: 32.6 g/dL (ref 30.0–36.0)
MCV: 94.2 fL (ref 80.0–100.0)
Platelets: 257 10*3/uL (ref 150–400)
RBC: 3.26 MIL/uL — ABNORMAL LOW (ref 3.87–5.11)
RDW: 13.9 % (ref 11.5–15.5)
WBC: 8 10*3/uL (ref 4.0–10.5)
nRBC: 0 % (ref 0.0–0.2)

## 2020-06-18 LAB — URINALYSIS, COMPLETE (UACMP) WITH MICROSCOPIC
Bilirubin Urine: NEGATIVE
Glucose, UA: NEGATIVE mg/dL
Ketones, ur: NEGATIVE mg/dL
Nitrite: NEGATIVE
Protein, ur: 100 mg/dL — AB
Specific Gravity, Urine: 1.01 (ref 1.005–1.030)
WBC, UA: >50 WBC/hpf — ABNORMAL HIGH (ref 0–5)
pH: 7 (ref 5.0–8.0)

## 2020-06-18 LAB — HEPATIC FUNCTION PANEL
ALT: 10 U/L (ref 0–44)
AST: 21 U/L (ref 15–41)
Albumin: 3.5 g/dL (ref 3.5–5.0)
Alkaline Phosphatase: 74 U/L (ref 38–126)
Bilirubin, Direct: 0.2 mg/dL (ref 0.0–0.2)
Indirect Bilirubin: 0.6 mg/dL (ref 0.3–0.9)
Total Bilirubin: 0.8 mg/dL (ref 0.3–1.2)
Total Protein: 6.2 g/dL — ABNORMAL LOW (ref 6.5–8.1)

## 2020-06-18 LAB — BASIC METABOLIC PANEL
Anion gap: 9 (ref 5–15)
BUN: 47 mg/dL — ABNORMAL HIGH (ref 8–23)
CO2: 25 mmol/L (ref 22–32)
Calcium: 9.3 mg/dL (ref 8.9–10.3)
Chloride: 105 mmol/L (ref 98–111)
Creatinine, Ser: 3.3 mg/dL — ABNORMAL HIGH (ref 0.44–1.00)
GFR calc Af Amer: 14 mL/min — ABNORMAL LOW (ref >60)
GFR calc non Af Amer: 12 mL/min — ABNORMAL LOW (ref >60)
Glucose, Bld: 129 mg/dL — ABNORMAL HIGH (ref 70–99)
Potassium: 4.5 mmol/L (ref 3.5–5.1)
Sodium: 139 mmol/L (ref 135–145)

## 2020-06-18 MED ORDER — LORATADINE 10 MG PO TABS
10.0000 mg | ORAL_TABLET | Freq: Once | ORAL | Status: AC
  Administered 2020-06-18: 10 mg via ORAL
  Filled 2020-06-18: qty 1

## 2020-06-18 MED ORDER — SODIUM CHLORIDE 0.9% FLUSH: 3.0000 mL | Freq: Once | INTRAVENOUS | Status: DC

## 2020-06-18 NOTE — ED Notes (Signed)
PO allergy medication administered at this time to assist with itching per EDP. Pt given water per patient request with EDP permission.

## 2020-06-18 NOTE — ED Notes (Signed)
EDP at bedside at this time.  

## 2020-06-18 NOTE — ED Provider Notes (Signed)
[facility] Emergency Department Provider Note    First MD Initiated Contact with Patient 06/18/20 1746     (approximate)  I have reviewed the triage vital signs and the nursing notes.   HISTORY  Chief Complaint Rash and Leg Swelling    HPI Christina Mercado is a 84 y.o. female chronic kidney disease as well as amyloidosis presented to the ER for evaluation of worsening lower extremity swelling and itching.  She is been taking hydroxyzine without any improvement.  Benadryl makes her very sleepy as well.  She denies any chest pain or shortness of breath.  Denies any abdominal pain.  Does have remote history of DVT.  Not currently on anticoagulation. Denies dysuria or flank pain.   Past Medical History:  Diagnosis Date  . Arthritis   . Diabetes mellitus without complication ([place])   . Hypertension   . Kidney failure    stage 3  . Proteinuria    No family history on file. Past Surgical History:  Procedure Laterality Date  . RENAL BIOPSY    . TOTAL HIP ARTHROPLASTY Right    Patient Active Problem List   Diagnosis Date Noted  . Hip pain, acute 06/03/2018  . CKD (chronic kidney disease), stage V ([place]) 06/02/2018  . Acute pain of left hip 06/02/2018  . Acute pain of left knee 06/02/2018  . 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 Leukocytosis 07/04/2016  . E-coli UTI 07/04/2016  . Essential hypertension, malignant 07/04/2016  . Pyelonephritis 07/02/2016  . Hematuria 07/02/2016  . Abdominal pain 07/02/2016  . Acute on chronic renal failure ([place]) 07/02/2016      Prior to Admission medications   Medication Sig Start Date End Date Taking? Authorizing Provider  acetaminophen (TYLENOL) 650 MG CR tablet Take 650 mg by mouth every 8 (eight) hours as needed for pain.    [provider]  amLODipine (NORVASC) 5 MG tablet Take 0.5 tablets (2.5 mg total) by mouth daily. 06/03/18   Bettey Costa, MD  atorvastatin (LIPITOR) 40 MG tablet  Take 40 mg by mouth daily at 6 PM.    [provider]  Cholecalciferol (VITAMIN D3) 1000 units CAPS Take 2,000 Units by mouth every morning.    [provider]  docusate sodium (COLACE) 100 MG capsule Take 1 capsule (100 mg total) by mouth 2 (two) times daily. 07/04/16   Theodoro Grist, MD  ELIQUIS STARTER PACK (ELIQUIS STARTER PACK) 5 MG TABS Take as directed on package: start with two-5mg  tablets twice daily for 7 days. On day 8, switch to one-5mg  tablet twice daily. 12/20/18   Rudene Re, MD  ferrous sulfate 325 (65 FE) MG tablet Take 325 mg by mouth daily with breakfast.    [provider]  glimepiride (AMARYL) 1 MG tablet Take 1 mg by mouth daily with breakfast.    [provider]  Levothyroxine Sodium 150 MCG CAPS Take 137 mcg by mouth daily before breakfast.  06/17/16   [provider]  magnesium oxide (MAG-OX) 400 MG tablet Take 400 mg by mouth daily.    [provider]  Oxycodone HCl 10 MG TABS Take 10 mg by mouth every 6 (six) hours as needed for pain. 06/21/16   [provider]  polyethylene glycol (MIRALAX / GLYCOLAX) packet Take 17 g by mouth daily.    [provider]  predniSONE (DELTASONE) 10 MG tablet Take 1 tablet (10 mg total) by mouth daily with breakfast. 50 mg PO (  ORAL)  x 2 days 40 mg PO (ORAL)  x 2 days 30 mg PO  (ORAL)  x 2 days 20 mg PO  (ORAL) x 2 days 10 mg PO  (ORAL) x 2 days then  Resume 5 mg daily 06/03/18   Bettey Costa, MD  predniSONE (DELTASONE) 5 MG tablet Take 5 mg by mouth every morning.  05/15/16   [provider]  sertraline (ZOLOFT) 100 MG tablet Take 100 mg by mouth daily.    [provider]  vitamin B-12 (CYANOCOBALAMIN) 1000 MCG tablet Take 1,000 mcg by mouth every morning.    [provider]    Allergies Byetta 10 mcg pen [exenatide], Ibuprofen, Lisinopril, and Aspirin    Social History Social History   Tobacco Use  . Smoking status: Never Smoker    . Smokeless tobacco: Never Used  Vaping Use  . Vaping Use: Never used  Substance Use Topics  . Alcohol use: No  . Drug use: Never    Review of Systems Patient denies headaches, rhinorrhea, blurry vision, numbness, shortness of breath, chest pain, edema, cough, abdominal pain, nausea, vomiting, diarrhea, dysuria, fevers, rashes or hallucinations unless otherwise stated above in HPI. ____________________________________________   PHYSICAL EXAM:  VITAL SIGNS: Vitals:   06/18/20 1802 06/18/20 1959  BP: (!) 161/100 (!) 130/100  Pulse: 79 82  Resp: 18 16  Temp:  98.3 F (36.8 C)  SpO2: 99% 99%    Constitutional: Alert, HOH, pleasant and in NAD Eyes: Conjunctivae are normal.  Head: Atraumatic. Nose: No congestion/rhinnorhea. Mouth/Throat: Mucous membranes are moist.   Neck: No stridor. Painless ROM.  Cardiovascular: Normal rate, regular rhythm. Grossly normal heart sounds.  Good peripheral circulation. Respiratory: Normal respiratory effort.  No retractions. Lungs CTAB. Gastrointestinal: Soft and nontender. No distention. No abdominal bruits. No CVA tenderness. Genitourinary: deferred Musculoskeletal: No lower extremity tenderness.  2+ pitting BLE edema.  No joint effusions. Neurologic:  Normal speech and language. No gross focal neurologic deficits are appreciated. No facial droop Skin:  Skin is warm, dry and intact. No rash noted. Psychiatric: Mood and affect are normal. Speech and behavior are normal.  ____________________________________________   LABS (all labs ordered are listed, but only abnormal results are displayed)  Results for orders placed or performed during the hospital encounter of 06/18/20 (from the past 24 hour(s))  Basic metabolic panel     Status: Abnormal   Collection Time: 06/18/20  1:37 PM  Result Value Ref Range   Sodium 139 135 - 145 mmol/L   Potassium 4.5 3.5 - 5.1 mmol/L   Chloride 105 98 - 111 mmol/L   CO2 25 22 - 32 mmol/L   Glucose, Bld  129 (H) 70 - 99 mg/dL   BUN 47 (H) 8 - 23 mg/dL   Creatinine, Ser 3.30 (H) 0.44 - 1.00 mg/dL   Calcium 9.3 8.9 - 10.3 mg/dL   GFR calc non Af Amer 12 (L) >60 mL/min   GFR calc Af Amer 14 (L) >60 mL/min   Anion gap 9 5 - 15  CBC     Status: Abnormal   Collection Time: 06/18/20  1:37 PM  Result Value Ref Range   WBC 8.0 4.0 - 10.5 K/uL   RBC 3.26 (L) 3.87 - 5.11 MIL/uL   Hemoglobin 10.0 (L) 12.0 - 15.0 g/dL   HCT 30.7 (L) 36 - 46 %   MCV 94.2 80.0 - 100.0 fL   MCH 30.7 26.0 - 34.0 pg   MCHC 32.6 30.0 -  36.0 g/dL   RDW 13.9 11.5 - 15.5 %   Platelets 257 150 - 400 K/uL   nRBC 0.0 0.0 - 0.2 %  Hepatic function panel     Status: Abnormal   Collection Time: 06/18/20  1:37 PM  Result Value Ref Range   Total Protein 6.2 (L) 6.5 - 8.1 g/dL   Albumin 3.5 3.5 - 5.0 g/dL   AST 21 15 - 41 U/L   ALT 10 0 - 44 U/L   Alkaline Phosphatase 74 38 - 126 U/L   Total Bilirubin 0.8 0.3 - 1.2 mg/dL   Bilirubin, Direct 0.2 0.0 - 0.2 mg/dL   Indirect Bilirubin 0.6 0.3 - 0.9 mg/dL   ____________________________________________  EKG My review and personal interpretation at Time: 13:38   Indication: edema  Rate: 80  Rhythm: sinus Axis: left Other: normal intervals,  ____________________________________________  RADIOLOGY  I personally reviewed all radiographic images ordered to evaluate for the above acute complaints and reviewed radiology reports and findings.  These findings were personally discussed with the patient.  Please see medical record for radiology report.  ____________________________________________   PROCEDURES  Procedure(s) performed:  Procedures    Critical Care performed: no ____________________________________________   INITIAL IMPRESSION / ASSESSMENT AND PLAN / ED COURSE  Pertinent labs & imaging results that were available during my care of the patient were reviewed by me and considered in my medical decision making (see chart for details).   DDX: DVT, anasarca,  CHF, CKD, amyloidosis, cellulitis  Christina Mercado is a 84 y.o. who presents to the ED with symptoms as described above.  Patient nontoxic-appearing.  Blood work appears roughly at baseline.  Will order ultrasound evaluate for DVT.  Does not have signs or symptoms of acute infectious process.  Will trial Claritin to see if this gives her some relief and reassess.  Clinical Course as of Jun 18 2000  Fri Jun 18, 2020  1956 Blood work is at baseline.  She remains hemodynamically stable.  She not complaining any chest pain or shortness of breath.  Her itching did significantly improve after Claritin.  Her ultrasound does not show any evidence of DVT to explain her swelling.  She has no white count no fever and her exam is consistent with chronic lower extremity edema likely secondary to her AL.  Does not appear c/w cellulitis at this time.  At this point do believe she is stable and appropriate for outpatient follow-up.   [PR]    Clinical Course User Index [PR] Merlyn Lot, MD    The patient was evaluated in Emergency Department today for the symptoms described in the history of present illness. He/she was evaluated in the context of the global COVID-19 pandemic, which necessitated consideration that the patient might be at risk for infection with the SARS-CoV-2 virus that causes COVID-19. Institutional protocols and algorithms that pertain to the evaluation of patients at risk for COVID-19 are in a state of rapid change based on information released by regulatory bodies including the CDC and federal and state organizations. These policies and algorithms were followed during the patient's care in the ED.  As part of my medical decision making, I reviewed the following data within the [place] notes reviewed and incorporated, Labs reviewed, notes from prior ED visits and [place] Controlled Substance Database   ____________________________________________   FINAL CLINICAL  IMPRESSION(S) / ED DIAGNOSES  Final diagnoses:  Leg swelling  Pruritus      NEW MEDICATIONS STARTED  DURING THIS VISIT:  New Prescriptions   No medications on file     Note:  This document was prepared using Dragon voice recognition software and may include unintentional dictation errors.    Merlyn Lot, MD 06/18/20 2001

## 2020-06-18 NOTE — ED Notes (Signed)
Pt updated in [place], VS reassessed. Pt upset at the wait time and requested the proper avenue to voice her concern. Pt provided with number for patient experience. RN apologetic to pt about wait.

## 2020-06-18 NOTE — ED Triage Notes (Signed)
Pt c/o itching all over, states she takes medication for that but sometimes she scratches her legs til they bleed , pt has noted swelling of BL LE with redness, rash .

## 2020-06-18 NOTE — ED Notes (Signed)
Pt assisted to the BR

## 2020-06-18 NOTE — Discharge Instructions (Addendum)
Please start taking claritin OTC to help with itching.  I also recommend wearing compression stockings to help with the swelling.  Follow up with your PCP and kidney specialist as scheduled.

## 2020-06-18 NOTE — ED Notes (Signed)
US tech at bedside at this time. ?

## 2020-12-15 DIAGNOSIS — D8481 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: Secondary | ICD-10-CM | POA: Insufficient documentation

## 2021-01-05 DIAGNOSIS — G620 Drug-induced polyneuropathy: Secondary | ICD-10-CM | POA: Insufficient documentation

## 2021-01-05 DIAGNOSIS — N186 End stage renal disease: Secondary | ICD-10-CM | POA: Insufficient documentation

## 2021-01-05 DIAGNOSIS — Z992 Dependence on renal dialysis: Secondary | ICD-10-CM | POA: Insufficient documentation

## 2021-05-27 ENCOUNTER — Emergency Department
Admission: EM | Admit: 2021-05-27 | Discharge: 2021-05-27 | Disposition: A | Discharge status: Home / Self Care | Payer: Medicare Other | Attending: Emergency Medicine | Admitting: Emergency Medicine

## 2021-05-27 ENCOUNTER — Encounter: Payer: Self-pay | Admitting: Emergency Medicine

## 2021-05-27 DIAGNOSIS — Z96641 Presence of right artificial hip joint: Secondary | ICD-10-CM | POA: Diagnosis not present

## 2021-05-27 DIAGNOSIS — Z992 Dependence on renal dialysis: Secondary | ICD-10-CM | POA: Diagnosis not present

## 2021-05-27 DIAGNOSIS — Z79899 Other long term (current) drug therapy: Secondary | ICD-10-CM | POA: Insufficient documentation

## 2021-05-27 DIAGNOSIS — T82898A Other specified complication of vascular prosthetic devices, implants and grafts, initial encounter: Secondary | ICD-10-CM

## 2021-05-27 DIAGNOSIS — N186 End stage renal disease: Secondary | ICD-10-CM | POA: Insufficient documentation

## 2021-05-27 DIAGNOSIS — I120 Hypertensive chronic kidney disease with stage 5 chronic kidney disease or end stage renal disease: Secondary | ICD-10-CM | POA: Insufficient documentation

## 2021-05-27 DIAGNOSIS — Z20822 Contact with and (suspected) exposure to covid-19: Secondary | ICD-10-CM | POA: Insufficient documentation

## 2021-05-27 DIAGNOSIS — Z7901 Long term (current) use of anticoagulants: Secondary | ICD-10-CM | POA: Insufficient documentation

## 2021-05-27 DIAGNOSIS — T8249XA Other complication of vascular dialysis catheter, initial encounter: Secondary | ICD-10-CM | POA: Diagnosis not present

## 2021-05-27 DIAGNOSIS — Z7984 Long term (current) use of oral hypoglycemic drugs: Secondary | ICD-10-CM | POA: Insufficient documentation

## 2021-05-27 DIAGNOSIS — E1122 Type 2 diabetes mellitus with diabetic chronic kidney disease: Secondary | ICD-10-CM | POA: Diagnosis not present

## 2021-05-27 LAB — CBC WITH DIFFERENTIAL/PLATELET
Abs Immature Granulocytes: 0.03 10*3/uL (ref 0.00–0.07)
Basophils Absolute: 0.1 10*3/uL (ref 0.0–0.1)
Basophils Relative: 1 %
Eosinophils Absolute: 0.1 10*3/uL (ref 0.0–0.5)
Eosinophils Relative: 2 %
HCT: 27.8 % — ABNORMAL LOW (ref 36.0–46.0)
Hemoglobin: 9.5 g/dL — ABNORMAL LOW (ref 12.0–15.0)
Immature Granulocytes: 0 %
Lymphocytes Relative: 28 %
Lymphs Abs: 2.2 10*3/uL (ref 0.7–4.0)
MCH: 32.3 pg (ref 26.0–34.0)
MCHC: 34.2 g/dL (ref 30.0–36.0)
MCV: 94.6 fL (ref 80.0–100.0)
Monocytes Absolute: 0.7 10*3/uL (ref 0.1–1.0)
Monocytes Relative: 9 %
Neutro Abs: 4.8 10*3/uL (ref 1.7–7.7)
Neutrophils Relative %: 60 %
Platelets: 201 10*3/uL (ref 150–400)
RBC: 2.94 MIL/uL — ABNORMAL LOW (ref 3.87–5.11)
RDW: 13.2 % (ref 11.5–15.5)
WBC: 7.9 10*3/uL (ref 4.0–10.5)
nRBC: 0 % (ref 0.0–0.2)

## 2021-05-27 LAB — BASIC METABOLIC PANEL
Anion gap: 11 (ref 5–15)
BUN: 60 mg/dL — ABNORMAL HIGH (ref 8–23)
CO2: 23 mmol/L (ref 22–32)
Calcium: 9.1 mg/dL (ref 8.9–10.3)
Chloride: 102 mmol/L (ref 98–111)
Creatinine, Ser: 5.25 mg/dL — ABNORMAL HIGH (ref 0.44–1.00)
GFR, Estimated: 8 mL/min — ABNORMAL LOW (ref >60)
Glucose, Bld: 91 mg/dL (ref 70–99)
Potassium: 3.9 mmol/L (ref 3.5–5.1)
Sodium: 136 mmol/L (ref 135–145)

## 2021-05-27 LAB — RESP PANEL BY RT-PCR (FLU A&B, COVID) ARPGX2
Influenza A by PCR: NEGATIVE
Influenza B by PCR: NEGATIVE
SARS Coronavirus 2 by RT PCR: NEGATIVE

## 2021-05-27 LAB — PROTIME-INR
INR: 1.1 (ref 0.8–1.2)
Prothrombin Time: 13.8 seconds (ref 11.4–15.2)

## 2021-05-27 NOTE — ED Provider Notes (Signed)
[facility] Emergency Department Provider Note  ____________________________________________  Time seen: Approximately 3:56 PM  I have reviewed the triage vital signs and the nursing notes.   HISTORY  Chief Complaint Vascular Access Problem    HPI Christina Mercado is a 85 y.o. female with a history of hypertension diabetes and end-stage renal disease who was sent to the ED due to dialysis access problem.  Patient had a left upper extremity AV fistula created in September 2020 at [facility] by Dr. Arsenio Loader in preparation for eventual dialysis.  The patient started hemodialysis this week, but 2 days ago and today the fistula was unable to be cannulated.  There was concern that it may have infiltrated and cause extravasation in her upper arm.  Patient denies chest pain short of breath or arm pain.  No paresthesias.  No fever.  No exertional symptoms.  She has some chronic lower extremity swelling which is much better than usual and has been attributed to amlodipine use.  Patient denies any acute symptoms today other than fatigue.  Past Medical History:  Diagnosis Date   Arthritis    Diabetes mellitus without complication ([place])    Hypertension    Kidney failure    stage 3   Proteinuria      Patient Active Problem List   Diagnosis Date Noted   Hip pain, acute 06/03/2018   CKD (chronic kidney disease), stage V ([place]) 06/02/2018   Acute pain of left hip 06/02/2018   Acute pain of left knee 06/02/2018   Inability to ambulate due to multiple joints 06/02/2018   Leukocytosis 07/04/2016   E-coli UTI 07/04/2016   Essential hypertension, malignant 07/04/2016   Pyelonephritis 07/02/2016   Hematuria 07/02/2016   Abdominal pain 07/02/2016   Acute on chronic renal failure ([place]) 07/02/2016     Past Surgical History:  Procedure Laterality Date   RENAL BIOPSY     TOTAL HIP ARTHROPLASTY Right      Prior to Admission medications   Medication Sig  Start Date End Date Taking? Authorizing Provider  acetaminophen (TYLENOL) 650 MG CR tablet Take 650 mg by mouth every 8 (eight) hours as needed for pain.    [provider]  amLODipine (NORVASC) 5 MG tablet Take 0.5 tablets (2.5 mg total) by mouth daily. 06/03/18   Bettey Costa, MD  atorvastatin (LIPITOR) 40 MG tablet Take 40 mg by mouth daily at 6 PM.    [provider]  Cholecalciferol (VITAMIN D3) 1000 units CAPS Take 2,000 Units by mouth every morning.    [provider]  docusate sodium (COLACE) 100 MG capsule Take 1 capsule (100 mg total) by mouth 2 (two) times daily. 07/04/16   Theodoro Grist, MD  ELIQUIS STARTER PACK (ELIQUIS STARTER PACK) 5 MG TABS Take as directed on package: start with two-5mg  tablets twice daily for 7 days. On day 8, switch to one-5mg  tablet twice daily. 12/20/18   Rudene Re, MD  ferrous sulfate 325 (65 FE) MG tablet Take 325 mg by mouth daily with breakfast.    [provider]  glimepiride (AMARYL) 1 MG tablet Take 1 mg by mouth daily with breakfast.    [provider]  Levothyroxine Sodium 150 MCG CAPS Take 137 mcg by mouth daily before breakfast.  06/17/16   [provider]  magnesium oxide (MAG-OX) 400 MG tablet Take 400 mg by mouth daily.    [provider]  Oxycodone HCl 10 MG TABS Take 10 mg by mouth every  6 (six) hours as needed for pain. 06/21/16   [provider]  polyethylene glycol (MIRALAX / GLYCOLAX) packet Take 17 g by mouth daily.    [provider]  predniSONE (DELTASONE) 10 MG tablet Take 1 tablet (10 mg total) by mouth daily with breakfast. 50 mg PO (ORAL)  x 2 days 40 mg PO (ORAL)  x 2 days 30 mg PO  (ORAL)  x 2 days 20 mg PO  (ORAL) x 2 days 10 mg PO  (ORAL) x 2 days then  Resume 5 mg daily 06/03/18   Bettey Costa, MD  predniSONE (DELTASONE) 5 MG tablet Take 5 mg by mouth every morning.  05/15/16   [provider]  sertraline (ZOLOFT) 100 MG tablet Take 100 mg  by mouth daily.    [provider]  vitamin B-12 (CYANOCOBALAMIN) 1000 MCG tablet Take 1,000 mcg by mouth every morning.    [provider]     Allergies Byetta 10 mcg pen [exenatide], Ibuprofen, Lisinopril, and Aspirin   History reviewed. No pertinent family history.  Social History Social History   Tobacco Use   Smoking status: Never   Smokeless tobacco: Never  Vaping Use   Vaping Use: Never used  Substance Use Topics   Alcohol use: No   Drug use: Never    Review of Systems  Constitutional:   No fever or chills.  ENT:   No sore throat. No rhinorrhea. Cardiovascular:   No chest pain or syncope. Respiratory:   No dyspnea or cough. Gastrointestinal:   Negative for abdominal pain, vomiting and diarrhea.  Musculoskeletal:   Negative for focal pain or swelling All other systems reviewed and are negative except as documented above in ROS and HPI.  ____________________________________________   PHYSICAL EXAM:  VITAL SIGNS: ED Triage Vitals [05/27/21 1348]  Enc Vitals Group     BP (!) 158/62     Pulse Rate 76     Resp 17     Temp 98.3 F (36.8 C)     Temp Source Oral     SpO2 99 %     Weight 174 lb 2.6 oz (79 kg)     Height 5\' 5"  (1.651 m)     Head Circumference      Peak Flow      Pain Score      Pain Loc      Pain Edu?      Excl. in [place]?     Vital signs reviewed, nursing assessments reviewed.   Constitutional:   Alert and oriented. Non-toxic appearance. Eyes:   Conjunctivae are normal. EOMI. PERRL. ENT      Head:   Normocephalic and atraumatic.      Nose:   Normal.      Mouth/Throat:   Normal      Neck:   No meningismus. Full ROM. Hematological/Lymphatic/Immunilogical:   No cervical lymphadenopathy. Cardiovascular:   RRR. Symmetric bilateral radial and DP pulses.  No murmurs. Cap refill less than 2 seconds. Respiratory:   Normal respiratory effort without tachypnea/retractions. Breath sounds are clear and equal bilaterally. No  wheezes/rales/rhonchi. Gastrointestinal:   Soft and nontender. Non distended. There is no CVA tenderness.  No rebound, rigidity, or guarding. Genitourinary:   deferred Musculoskeletal:   Normal range of motion in all extremities. No joint effusions.  No lower extremity tenderness.  1+ bilateral pitting edema. Neurologic:   Normal speech and language.  Motor grossly intact. No acute focal neurologic deficits are appreciated.  Skin:  Skin is warm, dry and intact. No rash noted.  No petechiae, purpura, or bullae.  ____________________________________________    LABS (pertinent positives/negatives) (all labs ordered are listed, but only abnormal results are displayed) Labs Reviewed  CBC WITH DIFFERENTIAL/PLATELET - Abnormal; Notable for the following components:      Result Value   RBC 2.94 (*)    Hemoglobin 9.5 (*)    HCT 27.8 (*)    All other components within normal limits  BASIC METABOLIC PANEL - Abnormal; Notable for the following components:   BUN 60 (*)    Creatinine, Ser 5.25 (*)    GFR, Estimated 8 (*)    All other components within normal limits  RESP PANEL BY RT-PCR (FLU A&B, COVID) ARPGX2  PROTIME-INR   ____________________________________________   EKG  Interpreted by me Sinus rhythm rate of 73, left axis, normal intervals.  Normal QRS ST segments and T waves.  No signs of dysrhythmia or ischemia  ____________________________________________    RADIOLOGY  No results found.  ____________________________________________   PROCEDURES Procedures  ____________________________________________    CLINICAL IMPRESSION / ASSESSMENT AND PLAN / ED COURSE  Medications ordered in the ED: Medications - No data to display  Pertinent labs & imaging results that were available during my care of the patient were reviewed by me and considered in my medical decision making (see chart for details).  Christina Mercado was evaluated in Emergency Department on 05/27/2021  for the symptoms described in the history of present illness. She was evaluated in the context of the global COVID-19 pandemic, which necessitated consideration that the patient might be at risk for infection with the SARS-CoV-2 virus that causes COVID-19. Institutional protocols and algorithms that pertain to the evaluation of patients at risk for COVID-19 are in a state of rapid change based on information released by regulatory bodies including the CDC and federal and state organizations. These policies and algorithms were followed during the patient's care in the ED.   Patient sent to ED for evaluation due to inability to dialyze.  Most recent labs in EMR were done a week ago.  We will recheck today.  Discussed with nephrology Dr. Zollie Scale who would like patient evaluated for permacath placement.  Discussed with vascular surgery Dr. Lucky Cowboy who can plan for outpatient permacath placement in 3 days on Monday without necessitating hospitalization if labs are reassuring.   ----------------------------------------- 5:39 PM on 05/27/2021 ----------------------------------------- Labs unremarkable, potassium 3.9.  Dr. Lucky Cowboy has contacted his scheduler to arrange for permacath placement on an outpatient basis on Monday, in 3 days.  Discussed with Dr. Zollie Scale who agrees this would be reasonable.  Patient provided strict return precautions regarding any worsening symptoms including shortness of breath complications dizziness or worsening generalized weakness.     ____________________________________________   FINAL CLINICAL IMPRESSION(S) / ED DIAGNOSES    Final diagnoses:  ESRD (end stage renal disease) ([place])  Problem with dialysis access, initial encounter [facility])     ED Discharge Orders     None       Portions of this note were generated with dragon dictation software. Dictation errors may occur despite best attempts at proofreading.   Carrie Mew, MD 05/27/21 1740

## 2021-05-27 NOTE — ED Notes (Signed)
Pt assisted to toilet via w/c

## 2021-05-27 NOTE — ED Notes (Signed)
Sig pad not available. Pt and son verbalize understanding of d/c instructions. Denies questions

## 2021-05-27 NOTE — Discharge Instructions (Addendum)
Your lab tests were okay today.  We discussed your case with vascular surgery who will plan to place a catheter on Monday.  Please come to the hospital medical mall entrance at 8:00am on Monday if you have not been contacted with another time to arrive.  Please return to the emergency department right away if you have shortness of breath, palpitations, severe fatigue, or other new concerns.

## 2021-05-27 NOTE — ED Triage Notes (Signed)
Pt with son who reports pt had Dialysis for the first time on Monday and then when she went back to have second on Wednesday, the access area infiltrated and son was referred by pts doctor to come to ED today and have a new CDC placed.

## 2021-05-27 NOTE — ED Notes (Signed)
See triage note, pt reports vascular access problem. Fistula to left arm.  Reports has had one dialysis treatment, was supposed to get dialysis today but unable to accesss.  Denies shob or pain Alert and oriented. NAD noted

## 2021-05-27 NOTE — ED Notes (Signed)
Pt assisted to restroom via w/c

## 2021-05-30 ENCOUNTER — Ambulatory Visit
Admission: RE | Admit: 2021-05-30 | Discharge: 2021-05-30 | Disposition: A | Discharge status: Home / Self Care | Payer: Medicare Other | Source: Ambulatory Visit | Attending: Vascular Surgery | Admitting: Vascular Surgery

## 2021-05-30 ENCOUNTER — Telehealth (INDEPENDENT_AMBULATORY_CARE_PROVIDER_SITE_OTHER): Payer: Self-pay

## 2021-05-30 ENCOUNTER — Other Ambulatory Visit: Payer: Self-pay

## 2021-05-30 ENCOUNTER — Other Ambulatory Visit (INDEPENDENT_AMBULATORY_CARE_PROVIDER_SITE_OTHER): Payer: Self-pay | Admitting: Nurse Practitioner

## 2021-05-30 ENCOUNTER — Encounter
Admission: RE | Disposition: A | Discharge status: Home / Self Care | Payer: Self-pay | Source: Ambulatory Visit | Attending: Vascular Surgery

## 2021-05-30 ENCOUNTER — Encounter: Payer: Self-pay | Admitting: Vascular Surgery

## 2021-05-30 DIAGNOSIS — Z888 Allergy status to other drugs, medicaments and biological substances status: Secondary | ICD-10-CM | POA: Diagnosis not present

## 2021-05-30 DIAGNOSIS — Z992 Dependence on renal dialysis: Secondary | ICD-10-CM | POA: Insufficient documentation

## 2021-05-30 DIAGNOSIS — N186 End stage renal disease: Secondary | ICD-10-CM

## 2021-05-30 DIAGNOSIS — T8249XA Other complication of vascular dialysis catheter, initial encounter: Secondary | ICD-10-CM

## 2021-05-30 DIAGNOSIS — I120 Hypertensive chronic kidney disease with stage 5 chronic kidney disease or end stage renal disease: Secondary | ICD-10-CM | POA: Insufficient documentation

## 2021-05-30 DIAGNOSIS — Z886 Allergy status to analgesic agent status: Secondary | ICD-10-CM | POA: Diagnosis not present

## 2021-05-30 DIAGNOSIS — E1122 Type 2 diabetes mellitus with diabetic chronic kidney disease: Secondary | ICD-10-CM | POA: Diagnosis not present

## 2021-05-30 HISTORY — PX: DIALYSIS/PERMA CATHETER INSERTION: CATH118288

## 2021-05-30 LAB — GLUCOSE, CAPILLARY: Glucose-Capillary: 85 mg/dL (ref 70–99)

## 2021-05-30 SURGERY — DIALYSIS/PERMA CATHETER INSERTION
Anesthesia: Moderate Sedation

## 2021-05-30 MED ORDER — SODIUM CHLORIDE 0.9 % IV SOLN: INTRAVENOUS | Status: DC

## 2021-05-30 MED ORDER — HEPARIN SODIUM (PORCINE) 10000 UNIT/ML IJ SOLN
INTRAMUSCULAR | Status: AC
  Filled 2021-05-30: qty 1

## 2021-05-30 MED ORDER — FAMOTIDINE 20 MG PO TABS: 40.0000 mg | ORAL_TABLET | Freq: Once | ORAL | Status: DC | PRN

## 2021-05-30 MED ORDER — MIDAZOLAM HCL 5 MG/5ML IJ SOLN
INTRAMUSCULAR | Status: AC
  Filled 2021-05-30: qty 5

## 2021-05-30 MED ORDER — SODIUM CHLORIDE 0.9 % IV SOLN: 1.0000 g | Freq: Once | INTRAVENOUS | Status: DC

## 2021-05-30 MED ORDER — HEPARIN SODIUM (PORCINE) 5000 UNIT/ML IJ SOLN
INTRAMUSCULAR | Status: AC
  Filled 2021-05-30: qty 1

## 2021-05-30 MED ORDER — CHLORHEXIDINE GLUCONATE CLOTH 2 % EX PADS: 6.0000 | MEDICATED_PAD | Freq: Every day | CUTANEOUS | Status: DC

## 2021-05-30 MED ORDER — MIDAZOLAM HCL 2 MG/2ML IJ SOLN
INTRAMUSCULAR | Status: DC | PRN
  Administered 2021-05-30: 1 mg via INTRAVENOUS

## 2021-05-30 MED ORDER — DIPHENHYDRAMINE HCL 50 MG/ML IJ SOLN: 50.0000 mg | Freq: Once | INTRAMUSCULAR | Status: DC | PRN

## 2021-05-30 MED ORDER — MIDAZOLAM HCL 2 MG/ML PO SYRP: 8.0000 mg | ORAL_SOLUTION | Freq: Once | ORAL | Status: DC | PRN

## 2021-05-30 MED ORDER — HYDROMORPHONE HCL 1 MG/ML IJ SOLN: 1.0000 mg | Freq: Once | INTRAMUSCULAR | Status: DC | PRN

## 2021-05-30 MED ORDER — METHYLPREDNISOLONE SODIUM SUCC 125 MG IJ SOLR: 125.0000 mg | Freq: Once | INTRAMUSCULAR | Status: DC | PRN

## 2021-05-30 MED ORDER — FENTANYL CITRATE (PF) 100 MCG/2ML IJ SOLN
INTRAMUSCULAR | Status: AC
  Filled 2021-05-30: qty 2

## 2021-05-30 MED ORDER — FENTANYL CITRATE (PF) 100 MCG/2ML IJ SOLN
INTRAMUSCULAR | Status: DC | PRN
  Administered 2021-05-30: 25 ug via INTRAVENOUS

## 2021-05-30 MED ORDER — ONDANSETRON HCL 4 MG/2ML IJ SOLN: 4.0000 mg | Freq: Four times a day (QID) | INTRAMUSCULAR | Status: DC | PRN

## 2021-05-30 MED ORDER — SODIUM CHLORIDE 0.9 % IV SOLN
INTRAVENOUS | Status: AC
  Administered 2021-05-30: 1 g via INTRAVENOUS
  Filled 2021-05-30: qty 10

## 2021-05-30 SURGICAL SUPPLY — 7 items
BIOPATCH RED 1 DISK 7.0 (GAUZE/BANDAGES/DRESSINGS) ×2 IMPLANT
CATH CANNON HEMO 15FR 19 (HEMODIALYSIS SUPPLIES) ×2 IMPLANT
DERMABOND ADVANCED (GAUZE/BANDAGES/DRESSINGS) ×1
DERMABOND ADVANCED .7 DNX12 (GAUZE/BANDAGES/DRESSINGS) ×1 IMPLANT
PACK ANGIOGRAPHY (CUSTOM PROCEDURE TRAY) ×2 IMPLANT
SUT MNCRL AB 4-0 PS2 18 (SUTURE) ×2 IMPLANT
SUT PROLENE 0 CT 1 30 (SUTURE) ×2 IMPLANT

## 2021-05-30 NOTE — Progress Notes (Signed)
Dr. Lucky Cowboy came by bedside and spoke with pt. Re: [facility] Cath placement. Pt. Verbalized understanding of conversation.

## 2021-05-30 NOTE — H&P (Signed)
[facility] Admission History & Physical  MRN : [id number]  Christina Mercado is a 85 y.o. ([birth year]) female who presents with chief complaint of No chief complaint on file. Marland Kitchen  History of Present Illness: Presents today for PermCath placement.  She was seen in the emergency department Friday evening and found to have an infiltration of her relatively new arm access.  Although this was placed a year ago, it had only been used for dialysis once.  The arm is significantly bruised and swollen and will not be usable for dialysis for probably 1 to 2 weeks.  She has just recently started on dialysis.  Last week were her first dialysis treatments.  No new complaints.  Current Facility-Administered Medications  Medication Dose Route Frequency Provider Last Rate Last Admin   0.9 %  sodium chloride infusion   Intravenous Continuous Kris Hartmann, NP       ceFAZolin (ANCEF) 1 g in sodium chloride 0.9 % 100 mL IVPB  1 g Intravenous Once Kris Hartmann, NP       [START ON 05/31/2021] Chlorhexidine Gluconate Cloth 2 % PADS 6 each  6 each Topical Q0600 Kris Hartmann, NP       diphenhydrAMINE (BENADRYL) injection 50 mg  50 mg Intravenous Once PRN Kris Hartmann, NP       famotidine (PEPCID) tablet 40 mg  40 mg Oral Once PRN Kris Hartmann, NP       HYDROmorphone (DILAUDID) injection 1 mg  1 mg Intravenous Once PRN Kris Hartmann, NP       methylPREDNISolone sodium succinate (SOLU-MEDROL) 125 mg/2 mL injection 125 mg  125 mg Intravenous Once PRN Kris Hartmann, NP       midazolam (VERSED) 2 MG/ML syrup 8 mg  8 mg Oral Once PRN Kris Hartmann, NP       ondansetron [facility]) injection 4 mg  4 mg Intravenous Q6H PRN Kris Hartmann, NP        Past Medical History:  Diagnosis Date   Arthritis    Diabetes mellitus without complication (HCC)    Hypertension    Kidney failure    stage 3   Proteinuria     Past Surgical History:  Procedure Laterality Date   RENAL BIOPSY      TOTAL HIP ARTHROPLASTY Right      Social History   Tobacco Use   Smoking status: Never   Smokeless tobacco: Never  Vaping Use   Vaping Use: Never used  Substance Use Topics   Alcohol use: No   Drug use: Never     Family History No bleeding disorders, clotting disorders, autoimmune diseases, or aneurysms  Allergies  Allergen Reactions   Byetta 10 Mcg Pen [Exenatide] Nausea And Vomiting and Other (See Comments)    Weight loss    Ibuprofen Swelling    Arm swelling - not sure if it was the ibuprofen but she uses tylenol instead. Is able to take Mobic so this is not a class allergy.   Lisinopril Rash   Aspirin Other (See Comments)    Unknown      REVIEW OF SYSTEMS (Negative unless checked)  Constitutional: [] Weight loss  [] Fever  [] Chills Cardiac: [] Chest pain   [] Chest pressure   [] Palpitations   [] Shortness of breath when laying flat   [] Shortness of breath at rest   [x] Shortness of breath with exertion. Vascular:  [] Pain in legs with walking   [] Pain in legs at  rest   [] Pain in legs when laying flat   [] Claudication   [] Pain in feet when walking  [] Pain in feet at rest  [] Pain in feet when laying flat   [] History of DVT   [] Phlebitis   [x] Swelling in legs   [] Varicose veins   [] Non-healing ulcers Pulmonary:   [] Uses home oxygen   [] Productive cough   [] Hemoptysis   [] Wheeze  [] COPD   [] Asthma Neurologic:  [] Dizziness  [] Blackouts   [] Seizures   [] History of stroke   [] History of TIA  [] Aphasia   [] Temporary blindness   [] Dysphagia   [] Weakness or numbness in arms   [] Weakness or numbness in legs Musculoskeletal:  [x] Arthritis   [] Joint swelling   [] Joint pain   [] Low back pain Hematologic:  [x] Easy bruising  [] Easy bleeding   [] Hypercoagulable state   [x] Anemic  [] Hepatitis Gastrointestinal:  [] Blood in stool   [] Vomiting blood  [] Gastroesophageal reflux/heartburn   [] Difficulty swallowing. Genitourinary:  [x] Chronic kidney disease   [] Difficult urination  [] Frequent  urination  [] Burning with urination   [] Blood in urine Skin:  [] Rashes   [] Ulcers   [] Wounds Psychological:  [] History of anxiety   []  History of major depression.  Physical Examination  Vitals:   05/30/21 1234  BP: (!) 176/63  Pulse: 80  Resp: 18  Temp: 97.8 F (36.6 C)  TempSrc: Oral  SpO2: 96%  Weight: 79 kg  Height: 5\' 5"  (1.651 m)   Body mass index is 28.98 kg/m. Gen: WD/WN, NAD Head: [place]/AT, No temporalis wasting.  Ear/Nose/Throat: Hearing grossly intact, nares w/o erythema or drainage, oropharynx w/o Erythema/Exudate,  Eyes: Conjunctiva clear, sclera non-icteric Neck: Trachea midline.  No JVD.  Pulmonary:  Good air movement, respirations not labored, no use of accessory muscles.  Cardiac: RRR, normal S1, S2. Vascular: Bruising on left upper arm dialysis access site with significant swelling Vessel Right Left  Radial Palpable Palpable                   Musculoskeletal: M/S 5/5 throughout.  Extremities without ischemic changes.  No deformity or atrophy.  Neurologic: Sensation grossly intact in extremities.  Symmetrical.  Speech is fluent. Motor exam as listed above. Psychiatric: Judgment intact, Mood & affect appropriate for pt's clinical situation. Dermatologic: No rashes or ulcers noted.  No cellulitis or open wounds.      CBC Lab Results  Component Value Date   WBC 7.9 05/27/2021   HGB 9.5 (L) 05/27/2021   HCT 27.8 (L) 05/27/2021   MCV 94.6 05/27/2021   PLT 201 05/27/2021    BMET    Component Value Date/Time   NA 136 05/27/2021 1538   K 3.9 05/27/2021 1538   CL 102 05/27/2021 1538   CO2 23 05/27/2021 1538   GLUCOSE 91 05/27/2021 1538   BUN 60 (H) 05/27/2021 1538   CREATININE 5.25 (H) 05/27/2021 1538   CALCIUM 9.1 05/27/2021 1538   GFRNONAA 8 (L) 05/27/2021 1538   GFRAA 14 (L) 06/18/2020 1337   Estimated Creatinine Clearance: 8.4 mL/min (A) (by C-G formula based on SCr of 5.25 mg/dL (H)).  COAG Lab Results  Component Value Date   INR 1.1  05/27/2021   INR 1.05 08/29/2018   INR 0.90 06/02/2018    Radiology No results found.   Assessment/Plan 1.  ESRD.  Her access is not currently functional and so she needs a permacath.  This will be placed today. 2.  Complication dialysis access.  We will place a PermCath for immediate  use but would consider a fistulogram in 1 to 2 weeks once her bruising and swelling from the infiltration has resolved 3.  Hypertension.  Stable on outpatient medications and blood pressure control important in reducing the progression of atherosclerotic disease. On appropriate oral medications. 4.  Diabetes. Stable on outpatient medications and blood glucose control important in reducing the progression of atherosclerotic disease. Also, involved in wound healing. On appropriate medications.    Leotis Pain, MD  05/30/2021 12:44 PM

## 2021-05-30 NOTE — Op Note (Signed)
OPERATIVE NOTE    PRE-OPERATIVE DIAGNOSIS: 1. ESRD   POST-OPERATIVE DIAGNOSIS: same as above  PROCEDURE: Ultrasound guidance for vascular access to the right internal jugular vein Fluoroscopic guidance for placement of catheter Placement of a 19 cm tip to cuff tunneled hemodialysis catheter via the right internal jugular vein  SURGEON: Leotis Pain, MD  ANESTHESIA:  Local with Moderate conscious sedation for approximately 9 minutes using 1 mg of Versed and 25 mcg of Fentanyl  ESTIMATED BLOOD LOSS: 5 cc  FLUORO TIME: less than one minute  CONTRAST: none  FINDING(S): 1.  Patent right internal jugular vein  SPECIMEN(S):  None  INDICATIONS:   Christina Mercado is a 85 y.o.female who presents with renal failure and an access that has infiltrated and can't currently be used.  The patient needs long term dialysis access for their ESRD, and a Permcath is necessary.  Risks and benefits are discussed and informed consent is obtained.    DESCRIPTION: After obtaining full informed written consent, the patient was brought back to the vascular suited. The patient's right neck and chest were sterilely prepped and draped in a sterile surgical field was created. Moderate conscious sedation was administered during a face to face encounter with the patient throughout the procedure with my supervision of the RN administering medicines and monitoring the patient's vital signs, pulse oximetry, telemetry and mental status throughout from the start of the procedure until the patient was taken to the recovery room.  The right internal jugular vein was visualized with ultrasound and found to be patent. It was then accessed under direct ultrasound guidance and a permanent image was recorded. A wire was placed. After skin nick and dilatation, the peel-away sheath was placed over the wire. I then turned my attention to an area under the clavicle. Approximately 1-2 fingerbreadths below the clavicle a small  counterincision was created and tunneled from the subclavicular incision to the access site. Using fluoroscopic guidance, a 19 centimeter tip to cuff tunneled hemodialysis catheter was selected, and tunneled from the subclavicular incision to the access site. It was then placed through the peel-away sheath and the peel-away sheath was removed. Using fluoroscopic guidance the catheter tips were parked in the right atrium. The appropriate distal connectors were placed. It withdrew blood well and flushed easily with heparinized saline and a concentrated heparin solution was then placed. It was secured to the chest wall with 2 Prolene sutures. The access incision was closed single 4-0 Monocryl. A 4-0 Monocryl pursestring suture was placed around the exit site. Sterile dressings were placed. The patient tolerated the procedure well and was taken to the recovery room in stable condition.  COMPLICATIONS: None  CONDITION: Stable  Leotis Pain, MD 05/30/2021 1:32 PM   This note was created with Dragon Medical transcription system. Any errors in dictation are purely unintentional.

## 2021-05-30 NOTE — Telephone Encounter (Signed)
Patient's son called stating that the dialysis center was suppose to schedule the patient for a permcath placement today. I looked and found the fax requesting a permcath placement and scheduled the patient for today with a 12:00 pm arrival time to the MM for a port placement with Dr. Lucky Cowboy. Pre-procedure instructions were discussed and patient's son understood.

## 2021-07-29 ENCOUNTER — Telehealth (INDEPENDENT_AMBULATORY_CARE_PROVIDER_SITE_OTHER): Payer: Self-pay

## 2021-07-29 NOTE — Telephone Encounter (Signed)
Patient son return call and pre-procedure instructions were gone over the phone. Patient is schedule for CVC removal on 08/02/21 with Dr Delana Meyer arrival time 1:30.

## 2021-07-29 NOTE — Telephone Encounter (Signed)
Left message on patient son voicemail to return call to office to schedule his mother CVC removal

## 2021-08-02 ENCOUNTER — Other Ambulatory Visit (INDEPENDENT_AMBULATORY_CARE_PROVIDER_SITE_OTHER): Payer: Self-pay | Admitting: Nurse Practitioner

## 2021-08-02 ENCOUNTER — Ambulatory Visit
Admission: RE | Admit: 2021-08-02 | Discharge: 2021-08-02 | Disposition: A | Discharge status: Home / Self Care | Payer: Medicare Other | Attending: Vascular Surgery | Admitting: Vascular Surgery

## 2021-08-02 ENCOUNTER — Encounter
Admission: RE | Disposition: A | Discharge status: Home / Self Care | Payer: Self-pay | Source: Home / Self Care | Attending: Vascular Surgery

## 2021-08-02 DIAGNOSIS — T8249XA Other complication of vascular dialysis catheter, initial encounter: Secondary | ICD-10-CM | POA: Diagnosis not present

## 2021-08-02 DIAGNOSIS — E1122 Type 2 diabetes mellitus with diabetic chronic kidney disease: Secondary | ICD-10-CM | POA: Diagnosis not present

## 2021-08-02 DIAGNOSIS — N186 End stage renal disease: Secondary | ICD-10-CM | POA: Diagnosis not present

## 2021-08-02 DIAGNOSIS — Z888 Allergy status to other drugs, medicaments and biological substances status: Secondary | ICD-10-CM | POA: Diagnosis not present

## 2021-08-02 DIAGNOSIS — Z4901 Encounter for fitting and adjustment of extracorporeal dialysis catheter: Secondary | ICD-10-CM | POA: Diagnosis present

## 2021-08-02 DIAGNOSIS — I120 Hypertensive chronic kidney disease with stage 5 chronic kidney disease or end stage renal disease: Secondary | ICD-10-CM | POA: Insufficient documentation

## 2021-08-02 DIAGNOSIS — Z79899 Other long term (current) drug therapy: Secondary | ICD-10-CM | POA: Insufficient documentation

## 2021-08-02 DIAGNOSIS — Z79890 Hormone replacement therapy (postmenopausal): Secondary | ICD-10-CM | POA: Insufficient documentation

## 2021-08-02 DIAGNOSIS — I2510 Atherosclerotic heart disease of native coronary artery without angina pectoris: Secondary | ICD-10-CM | POA: Insufficient documentation

## 2021-08-02 DIAGNOSIS — Z886 Allergy status to analgesic agent status: Secondary | ICD-10-CM | POA: Insufficient documentation

## 2021-08-02 DIAGNOSIS — Z992 Dependence on renal dialysis: Secondary | ICD-10-CM | POA: Diagnosis not present

## 2021-08-02 HISTORY — PX: DIALYSIS/PERMA CATHETER REMOVAL: CATH118289

## 2021-08-02 SURGERY — DIALYSIS/PERMA CATHETER REMOVAL
Anesthesia: LOCAL

## 2021-08-02 MED ORDER — LIDOCAINE-EPINEPHRINE (PF) 1 %-1:200000 IJ SOLN
INTRAMUSCULAR | Status: DC | PRN
  Administered 2021-08-02: 10 mL via INTRADERMAL

## 2021-08-02 SURGICAL SUPPLY — 6 items
CHLORAPREP W/TINT 26 (MISCELLANEOUS) ×4 IMPLANT
DERMABOND ADVANCED (GAUZE/BANDAGES/DRESSINGS) ×1
DERMABOND ADVANCED .7 DNX12 (GAUZE/BANDAGES/DRESSINGS) ×1 IMPLANT
FORCEPS HALSTEAD CVD 5IN STRL (INSTRUMENTS) ×2 IMPLANT
SUT MNCRL AB 4-0 PS2 18 (SUTURE) ×2 IMPLANT
TRAY LACERAT/PLASTIC (MISCELLANEOUS) ×2 IMPLANT

## 2021-08-02 NOTE — Interval H&P Note (Signed)
History and Physical Interval Note:  08/02/2021 6:35 PM  Christina Mercado  has presented today for surgery, with the diagnosis of Perm Cath Removal   ESRD.  The various methods of treatment have been discussed with the patient and family. After consideration of risks, benefits and other options for treatment, the patient has consented to  Procedure(s): DIALYSIS/PERMA CATHETER REMOVAL (N/A) as a surgical intervention.  The patient's history has been reviewed, patient examined, no change in status, stable for surgery.  I have reviewed the patient's chart and labs.  Questions were answered to the patient's satisfaction.     Hortencia Pilar

## 2021-08-02 NOTE — Op Note (Signed)
  OPERATIVE NOTE   PROCEDURE: Removal of a right IJ tunneled dialysis catheter  PRE-OPERATIVE DIAGNOSIS: Complication of dialysis catheter  POST-OPERATIVE DIAGNOSIS: Same  SURGEON: Hortencia Pilar  ANESTHESIA: Local anesthetic with 1% lidocaine with epinephrine   ESTIMATED BLOOD LOSS: Minimal   FINDING(S): 1. Catheter intact   SPECIMEN(S):  Catheter  INDICATIONS:   Christina Mercado is a 85 y.o. female who presents with functioning left arm access and a nonfunctioning right IJ catheter.  She is undergoing removal of her catheter to prevent sepsis and other catheter related complications.  Risks and benefits of been reviewed all questions answered all are in agreement with proceeding..  DESCRIPTION: After obtaining full informed written consent, the patient was positioned supine. The right IJ catheter and surrounding area is prepped and draped in a sterile fashion. The cuff was localized by palpation and noted to be greater than 3 cm from the exit site. After appropriate timeout is called, 1% lidocaine with epinephrine is infiltrated into the surrounding tissues around the cuff. Small transverse incision is created with an 11 blade scalpel and the dissection was carried down to expose the cuff of the tunneled catheter.  The catheter is then freed from the surrounding attachments and adhesions. Once the catheter has been freed circumferentially it is transected just distal to the cuff and subsequently removed in 2 pieces. Light pressure was held at the base of the neck. A 4-0 Monocryl was used close the tunnel in the subcutaneous space. The 4-0 Monocryl Monocryl was then used to close the skin in a subcuticular stitch. Dermabond is applied.  Antibiotic ointment and a sterile dressing is applied to the exit site. Patient tolerated procedure well and there were no complications.  COMPLICATIONS: None  CONDITION: Unchanged  Hortencia Pilar. [facility] Office:  [phone]  08/02/2021,6:35 PM

## 2021-08-02 NOTE — Discharge Instructions (Signed)
Tunneled Catheter Removal, Care After Refer to this sheet in the next few weeks. These instructions provide you with information about caring for yourself after your procedure. Your health care provider may also give you more specific instructions. Your treatment has been planned according to current medical practices, but problems sometimes occur. Call your health care provider if you have any problems or questions after your procedure. What can I expect after the procedure? After the procedure, it is common to have: Some mild redness, swelling, and pain around your catheter site.   Follow these instructions at home: Incision care  Check your removal site  every day for signs of infection. Check for: More redness, swelling, or pain. More fluid or blood. Warmth. Pus or a bad smell. Remove your dressing in 48hrs leave open to air  Activity  Return to your normal activities as told by your health care provider. Ask your health care provider what activities are safe for you. Do not lift anything that is heavier than 10 lb (4.5 kg) for 3 days  You may shower tomorrow  Contact a health care provider if: You have more fluid or blood coming from your removal site You have more redness, swelling, or pain at your incisions or around the area where your catheter was removed Your removal site feel warm to the touch. You feel unusually weak. You feel nauseous.. Get help right away if You have swelling in your arm, shoulder, neck, or face. You develop chest pain. You have difficulty breathing. You feel dizzy or light-headed. You have pus or a bad smell coming from your removal site You have a fever. You develop bleeding from your removal site, and your bleeding does not stop. This information is not intended to replace advice given to you by your health care provider. Make sure you discuss any questions you have with your health care provider. Document Released: 11/13/2012 Document Revised:  07/30/2016 Document Reviewed: 08/23/2015 Elsevier Interactive Patient Education  2017 [employer] 

## 2021-08-02 NOTE — H&P (Signed)
[facility] Admission History & Physical  MRN : [id number]  Christina Mercado is a 85 y.o. (1936-09-15) female who presents with chief complaint of here for catheter removal.  History of Present Illness: I am asked to evaluate the patient by the dialysis center. The patient was sent here because they have a nonfunctioning tunneled catheter and a functioning left arm access.  The dialysis center reports they're not been any problems with any of their dialysis runs. They are reporting good flows with good parameters at dialysis.   No documented pain or tenderness overlying the access.  There is no pain with dialysis.  No documented hand pain or finger pain consistent with steal syndrome.  No fevers or chills while on dialysis.    No current facility-administered medications for this encounter.   Current Outpatient Medications  Medication Sig Dispense Refill   acetaminophen (TYLENOL) 500 MG tablet Take 1,000 mg by mouth every 8 (eight) hours as needed for moderate pain.     acyclovir (ZOVIRAX) 200 MG capsule Take 200 mg by mouth daily.     amLODipine (NORVASC) 2.5 MG tablet Take 2.5 mg by mouth daily.     Ascorbic Acid (VITAMIN C) 1000 MG tablet Take 1,000 mg by mouth daily.     atorvastatin (LIPITOR) 40 MG tablet Take 40 mg by mouth daily at 6 PM.     calcium gluconate 500 MG tablet Take 1 tablet by mouth daily.     Cholecalciferol (VITAMIN D3) 1000 units CAPS Take 2,000 Units by mouth every morning.     dexamethasone (DECADRON) 4 MG tablet Take 8 mg by mouth See admin instructions. Once a week, before [place] treatment.     docusate sodium (COLACE) 100 MG capsule Take 1 capsule (100 mg total) by mouth 2 (two) times daily. (Patient taking differently: Take 100 mg by mouth 2 (two) times daily as needed for moderate constipation.) 10 capsule 0   DULoxetine (CYMBALTA) 60 MG capsule Take 60 mg by mouth daily.     ferrous sulfate 325 (65 FE) MG tablet Take 325 mg by mouth  daily with breakfast.     hydrOXYzine (ATARAX/VISTARIL) 10 MG tablet Take 10 mg by mouth 3 (three) times daily as needed for itching.     levothyroxine (SYNTHROID) 150 MCG tablet Take 150 mcg by mouth daily before breakfast.     lidocaine-prilocaine (EMLA) cream Apply 1 application topically as needed (dialysis access).     NINLARO 3 MG capsule Take 3 mg by mouth once a week. Once a week for 3 weeks in a 28 day cycle     ondansetron (ZOFRAN) 8 MG tablet Take 8 mg by mouth every 8 (eight) hours as needed for nausea or vomiting.     Oxycodone HCl 10 MG TABS Take 10 mg by mouth 5 (five) times daily.  0   polyethylene glycol (MIRALAX / GLYCOLAX) packet Take 17 g by mouth daily as needed for moderate constipation.     vitamin B-12 (CYANOCOBALAMIN) 1000 MCG tablet Take 1,000 mcg by mouth every morning.      Past Medical History:  Diagnosis Date   Arthritis    Diabetes mellitus without complication ([place])    Hypertension    Kidney failure    stage 3   Proteinuria     Past Surgical History:  Procedure Laterality Date   DIALYSIS/PERMA CATHETER INSERTION N/A 05/30/2021   Procedure: DIALYSIS/PERMA CATHETER INSERTION;  Surgeon: Algernon Huxley, MD;  Location: [facility];  Service: Cardiovascular;  Laterality: N/A;   RENAL BIOPSY     TOTAL HIP ARTHROPLASTY Right     Social History Social History   Tobacco Use   Smoking status: Never   Smokeless tobacco: Never  Vaping Use   Vaping Use: Never used  Substance Use Topics   Alcohol use: No   Drug use: Never    Family History No family history on file.  No family history of bleeding or clotting disorders, autoimmune disease or porphyria  Allergies  Allergen Reactions   Byetta 10 Mcg Pen [Exenatide] Nausea And Vomiting and Other (See Comments)    Weight loss    Ibuprofen Swelling    Arm swelling - not sure if it was the ibuprofen but she uses tylenol instead. Is able to take Mobic so this is not a class allergy.   Lisinopril Rash    Aspirin Other (See Comments)    Unknown      REVIEW OF SYSTEMS (Negative unless checked)  Constitutional: [] Weight loss  [] Fever  [] Chills Cardiac: [] Chest pain   [] Chest pressure   [] Palpitations   [] Shortness of breath when laying flat   [] Shortness of breath at rest   [x] Shortness of breath with exertion. Vascular:  [] Pain in legs with walking   [] Pain in legs at rest   [] Pain in legs when laying flat   [] Claudication   [] Pain in feet when walking  [] Pain in feet at rest  [] Pain in feet when laying flat   [] History of DVT   [] Phlebitis   [] Swelling in legs   [] Varicose veins   [] Non-healing ulcers Pulmonary:   [] Uses home oxygen   [] Productive cough   [] Hemoptysis   [] Wheeze  [] COPD   [] Asthma Neurologic:  [] Dizziness  [] Blackouts   [] Seizures   [] History of stroke   [] History of TIA  [] Aphasia   [] Temporary blindness   [] Dysphagia   [] Weakness or numbness in arms   [] Weakness or numbness in legs Musculoskeletal:  [] Arthritis   [] Joint swelling   [] Joint pain   [] Low back pain Hematologic:  [] Easy bruising  [] Easy bleeding   [] Hypercoagulable state   [] Anemic  [] Hepatitis Gastrointestinal:  [] Blood in stool   [] Vomiting blood  [] Gastroesophageal reflux/heartburn   [] Difficulty swallowing. Genitourinary:  [x] Chronic kidney disease   [] Difficult urination  [] Frequent urination  [] Burning with urination   [] Blood in urine Skin:  [] Rashes   [] Ulcers   [] Wounds Psychological:  [] History of anxiety   []  History of major depression.  Physical Examination  Vitals:   08/02/21 1324 08/02/21 1400  BP: (!) 155/57 (!) 153/55  Pulse: 67 71  Resp: 16 16  Temp: 97.7 F (36.5 C)   TempSrc: Oral   SpO2: 97% 97%   There is no height or weight on file to calculate BMI. Gen: WD/WN, NAD Head: [place]/AT, No temporalis wasting. Prominent temp pulse not noted. Ear/Nose/Throat: Hearing grossly intact, nares w/o erythema or drainage, oropharynx w/o Erythema/Exudate,  Eyes: Conjunctiva clear, sclera  non-icteric Neck: Trachea midline.  No JVD.  Pulmonary:  Good air movement, respirations not labored, no use of accessory muscles.  Cardiac: RRR, normal S1, S2. Vascular: Right IJ catheter cuff approximately 2-1/2 to 3 cm from the exit site Vessel Right  Radial Palpable  Ulnar Not Palpable  Brachial Palpable  Gastrointestinal: soft, non-tender/non-distended. No guarding/reflex.  Musculoskeletal: M/S 5/5 throughout.  Extremities without ischemic changes.  No deformity or atrophy.  Neurologic: Sensation grossly intact in extremities.  Symmetrical.  Speech is  fluent. Motor exam as listed above. Psychiatric: Judgment intact, Mood & affect appropriate for pt's clinical situation. Dermatologic: No rashes or ulcers noted.  No cellulitis or open wounds. Lymph : No Cervical, Axillary, or Inguinal lymphadenopathy.   CBC Lab Results  Component Value Date   WBC 7.9 05/27/2021   HGB 9.5 (L) 05/27/2021   HCT 27.8 (L) 05/27/2021   MCV 94.6 05/27/2021   PLT 201 05/27/2021    BMET    Component Value Date/Time   NA 136 05/27/2021 1538   K 3.9 05/27/2021 1538   CL 102 05/27/2021 1538   CO2 23 05/27/2021 1538   GLUCOSE 91 05/27/2021 1538   BUN 60 (H) 05/27/2021 1538   CREATININE 5.25 (H) 05/27/2021 1538   CALCIUM 9.1 05/27/2021 1538   GFRNONAA 8 (L) 05/27/2021 1538   GFRAA 14 (L) 06/18/2020 1337   CrCl cannot be calculated (Patient's most recent lab result is older than the maximum 21 days allowed.).  COAG Lab Results  Component Value Date   INR 1.1 05/27/2021   INR 1.05 08/29/2018   INR 0.90 06/02/2018    Radiology PERIPHERAL VASCULAR CATHETERIZATION  Result Date: 08/02/2021 See surgical note for result.   Assessment/Plan 1.  Complication dialysis device with thrombosis AV access:  Patient's right IJ tunneled catheter is malfunctioning. The patient has an extremity access that is functioning well. Therefore, the patient will undergo removal of the tunneled catheter under local  anesthesia.  The risks and benefits were described to the patient.  All questions were answered.  The patient agrees to proceed with angiography and intervention. Potassium will be drawn to ensure that it is an appropriate level prior to performing intervention. 2.  End-stage renal disease requiring hemodialysis:  Patient will continue dialysis therapy without further interruption if a successful intervention is not achieved then a tunneled catheter will be placed. Dialysis has already been arranged. 3.  Hypertension:  Patient will continue medical management; nephrology is following no changes in oral medications. 4. Diabetes mellitus:  Glucose will be monitored and oral medications been held this morning once the patient has undergone the patient's procedure po intake will be reinitiated and again Accu-Cheks will be used to assess the blood glucose level and treat as needed. The patient will be restarted on the patient's usual hypoglycemic regime 5.  Coronary artery disease:  EKG will be monitored. Nitrates will be used if needed. The patient's oral cardiac medications will be continued.    Hortencia Pilar, MD  08/02/2021 6:31 PM

## 2021-08-03 ENCOUNTER — Encounter: Payer: Self-pay | Admitting: Vascular Surgery

## 2021-08-12 ENCOUNTER — Other Ambulatory Visit: Payer: Self-pay

## 2021-08-12 ENCOUNTER — Emergency Department
Admission: EM | Admit: 2021-08-12 | Discharge: 2021-08-12 | Disposition: A | Discharge status: Home / Self Care | Payer: Medicare Other | Attending: Emergency Medicine | Admitting: Emergency Medicine

## 2021-08-12 ENCOUNTER — Encounter: Payer: Self-pay | Admitting: Emergency Medicine

## 2021-08-12 DIAGNOSIS — Z992 Dependence on renal dialysis: Secondary | ICD-10-CM | POA: Diagnosis not present

## 2021-08-12 DIAGNOSIS — Z5321 Procedure and treatment not carried out due to patient leaving prior to being seen by health care provider: Secondary | ICD-10-CM | POA: Diagnosis not present

## 2021-08-12 LAB — CBC WITH DIFFERENTIAL/PLATELET
Abs Immature Granulocytes: 0.02 10*3/uL (ref 0.00–0.07)
Basophils Absolute: 0 10*3/uL (ref 0.0–0.1)
Basophils Relative: 1 %
Eosinophils Absolute: 0.2 10*3/uL (ref 0.0–0.5)
Eosinophils Relative: 3 %
HCT: 36.5 % (ref 36.0–46.0)
Hemoglobin: 12.1 g/dL (ref 12.0–15.0)
Immature Granulocytes: 0 %
Lymphocytes Relative: 28 %
Lymphs Abs: 1.6 10*3/uL (ref 0.7–4.0)
MCH: 32.7 pg (ref 26.0–34.0)
MCHC: 33.2 g/dL (ref 30.0–36.0)
MCV: 98.6 fL (ref 80.0–100.0)
Monocytes Absolute: 0.3 10*3/uL (ref 0.1–1.0)
Monocytes Relative: 5 %
Neutro Abs: 3.5 10*3/uL (ref 1.7–7.7)
Neutrophils Relative %: 63 %
Platelets: 225 10*3/uL (ref 150–400)
RBC: 3.7 MIL/uL — ABNORMAL LOW (ref 3.87–5.11)
RDW: 13.8 % (ref 11.5–15.5)
WBC: 5.7 10*3/uL (ref 4.0–10.5)
nRBC: 0 % (ref 0.0–0.2)

## 2021-08-12 LAB — COMPREHENSIVE METABOLIC PANEL
ALT: 16 U/L (ref 0–44)
AST: 26 U/L (ref 15–41)
Albumin: 3.5 g/dL (ref 3.5–5.0)
Alkaline Phosphatase: 75 U/L (ref 38–126)
Anion gap: 13 (ref 5–15)
BUN: 81 mg/dL — ABNORMAL HIGH (ref 8–23)
CO2: 26 mmol/L (ref 22–32)
Calcium: 8.8 mg/dL — ABNORMAL LOW (ref 8.9–10.3)
Chloride: 100 mmol/L (ref 98–111)
Creatinine, Ser: 7.32 mg/dL — ABNORMAL HIGH (ref 0.44–1.00)
GFR, Estimated: 5 mL/min — ABNORMAL LOW (ref >60)
Glucose, Bld: 106 mg/dL — ABNORMAL HIGH (ref 70–99)
Potassium: 4.4 mmol/L (ref 3.5–5.1)
Sodium: 139 mmol/L (ref 135–145)
Total Bilirubin: 0.7 mg/dL (ref 0.3–1.2)
Total Protein: 6.5 g/dL (ref 6.5–8.1)

## 2021-08-12 NOTE — ED Triage Notes (Signed)
Patient states she was sent here from dialysis since unable to use her fistula. Patient states last she remembers completing dialysis was last Friday. Patient denies any shortness of breath or pain. Patient answering questions appropriately- just hard of hearing.

## 2022-02-02 DIAGNOSIS — N186 End stage renal disease: Secondary | ICD-10-CM | POA: Insufficient documentation

## 2022-02-02 DIAGNOSIS — M1611 Unilateral primary osteoarthritis, right hip: Secondary | ICD-10-CM | POA: Insufficient documentation

## 2022-02-02 DIAGNOSIS — N812 Incomplete uterovaginal prolapse: Secondary | ICD-10-CM | POA: Insufficient documentation

## 2022-02-02 DIAGNOSIS — E039 Hypothyroidism, unspecified: Secondary | ICD-10-CM | POA: Insufficient documentation

## 2022-02-02 NOTE — Progress Notes (Signed)
Patient: Christina Mercado  Service Category: E/M  Provider: Gaspar Cola, MD  DOB: April 02, 1936  DOS: 02/06/2022  Referring Provider: Myrtie Soman, MD  MRN: [id number]  Setting: Ambulatory outpatient  PCP: Myrtie Soman, MD  Type: New Patient  Specialty: Interventional Pain Management    Location: Office  Delivery: Face-to-face     Primary Reason(s) for Visit: Encounter for initial evaluation of one or more chronic problems (new to examiner) potentially causing chronic pain, and posing a threat to normal musculoskeletal function. (Level of risk: High) CC: Shoulder Pain (Right left) and Knee Pain (bilateral)  HPI  Ms. Puder is an 86 y.o. year old, female patient, who comes for the first time to our practice referred by Myrtie Soman, MD for our initial evaluation of her chronic pain. She has Pyelonephritis; Hematuria; Abdominal pain; Acute on chronic renal failure ([place]); Leukocytosis; E-coli UTI; Essential hypertension, malignant; CKD (chronic kidney disease), stage V ([place]); Acute pain of left hip; Acute pain of left knee; Inability to ambulate due to multiple joints; Hip pain, acute; Acquired hypothyroidism; Anemia; Anxiety disorder due to medical condition; Bilateral lower extremity edema; Bradycardia, sinus; Cataracts, bilateral; Chronic pain disorder; Closed intertrochanteric fracture of hip, left, with routine healing, subsequent encounter; Constipation; H/O total hip arthroplasty; Hyperlipidemia with target LDL less than 100; Hypertension secondary to other renal disorders; 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 [facility]); Light chain deposition disease; Neuropathy due to chemotherapeutic drug ([place]); Onychogryphosis; Onychomycosis due to dermatophyte; Osteoarthritis of right hip; Osteoporosis, post-menopausal; Pelvic relaxation due to uterovaginal prolapse, incomplete; Premature contractions, atrial; Primary osteoarthritis of left knee; Recurrent major depressive disorder, in partial  remission ([place]); Rheumatoid arthritis, seropositive ([place]); Rotator cuff arthropathy of left shoulder; Secondary hyperparathyroidism ([place]); Chronic knee pain (1ry area of Pain) (Bilateral) (L>R); Encounter regarding vascular access for dialysis for ESRD ([place]); End stage renal disease ([place]); Chronic kidney disease, stage V (very severe) ([place]); Chronic pain syndrome; Pharmacologic therapy; Disorder of skeletal system; Problems influencing health status; Chronic shoulder pain (2ry area of Pain) (Bilateral) (L>R); and Abnormal CT scan, cervical spine (02/26/2019) on their problem list. Today she comes in for evaluation of her Shoulder Pain (Right left) and Knee Pain (bilateral)  Pain Assessment: Location: Right, Left Shoulder Radiating: shoulders down arms bilateral, knees cant stand or walk Onset: More than a month ago Duration: Chronic pain Quality: Aching, Constant Severity: 7 /10 (subjective, self-reported pain score)  Effect on ADL: ADL's, walking, standing Timing: Constant Modifying factors: meds, rest BP: 140/66   HR: 83  Onset and Duration: Gradual and Present longer than 3 months Cause of pain: Unknown Severity: NAS-11 at its worse: 9/10, NAS-11 at its best: 5/10, NAS-11 now: 6/10, and NAS-11 on the average: 7/10 Timing: Not influenced by the time of the day Aggravating Factors: Prolonged sitting, Prolonged standing, and Walking uphill Alleviating Factors: Cold packs, Hot packs, Lying down, Resting, and Warm showers or baths Associated Problems: Day-time cramps, Night-time cramps, Fatigue, Nausea, Numbness, Tingling, Pain that wakes patient up, and Pain that does not allow patient to sleep Quality of Pain: Constant Previous Examinations or Tests: Biopsy, Bone scan, MRI scan, Myelogram, X-rays, and Orthopedic evaluation Previous Treatments: Narcotic medications and Physical Therapy  The patient comes into the clinic today accompanied by her son.  She is very hard of hearing and he has  provided [place] with most of the needed information and he has also occasionally helped her understand the things that we were trying to get across.  According to the patient the primary  area of pain is that of the knees (Bilateral) (L>R).  They deny ever having had any knee surgery, but they indicate having had physical therapy as well as Occupational Therapy.  They indicate that several years ago she had bilateral knee injection x1 done by Ricardo Jericho, MD.  Apparently the injection did not help and therefore it was not repeated.  According to them it was an injection to "provide cushion to the knee".  They indicate that they have been told that she has "bone-on-bone".  The patient's secondary area pain is that of the shoulders (Bilateral) (L>R).  Again they deny any shoulder surgery, recent x-rays, but they do indicate her to have had physical therapy and Occupational Therapy.  They deny any joint injections or nerve blocks.  Interventional pain management: Apparently the only thing that she has had is bilateral intra-articular knee joint injection x1 with Visco supplementation therapy, which apparently did not help and she did not have repeated.  Because of that failure, they seem to be somewhat hesitant in considering other interventional therapies.  Other medical problems affecting her pain include multiple myeloma, kidney failure on dialysis, rheumatoid arthritis with positive rheumatoid factor.  She has had bilateral hip surgery, but other than complaining of one leg being shorter than the other, she is currently not having any hip pain.  She is currently living in an assisted living facility and they indicate having been to the [facility] clinic in [place] where they recommended to continue therapy as is.  Pharmacotherapy: Other than Tylenol, and all other over-the-counter creams, they indicated the patient to be taking oxycodone IR 10 mg 5 times per day which apparently she has been taking for  the past 13 years, nonstop.  The patient's pain medication seems to currently be prescribed by the patient's referring physician, Dr. Myrtie Soman.   Other treatments: The patient apparently undergoes dialysis 3 times per week for her kidney failure and chemotherapy once per month for her multiple myeloma.  Today I took the time to inform the patient and her son about my practice.  I have informed them that at this point, we are "short staffed" and therefore we would be unable to personally manage the patient's opioid analgesics.  I have informed them that we will be providing some recommendations.  I took the opportunity to explain the concept of tolerance and how "Drug Holidays" help control it.  I detailed how to do a slow taper to avoid withdrawals.  The patient is currently on dialysis and was unable to provide [place] with a urine sample.  Because she is scheduled to undergo dialysis today, we have declined doing any lab work.  We have ordered x-rays of both shoulders and both knees.  Today I talked to them about the possibilities of bilateral diagnostic genicular nerve blocks with possible follow-up using radiofrequency ablation.  In a similar way, I have offered diagnostic suprascapular nerve blocks.  Historic Controlled Substance Pharmacotherapy Review  PMP and historical list of controlled substances: Oxycodone IR 10 mg tablet, 1 tab p.o. 5 times per day (last filled on 01/05/2022) Current opioid analgesics: Oxycodone IR 10 mg tablet, 1 tab p.o. 5 times per day (last filled on 01/05/2022) MME/day: 75 mg/day  Historical Monitoring: The patient  reports no history of drug use. List of all UDS Test(s): No results found for: MDMA, COCAINSCRNUR, [place], [place], CANNABQUANT, [place], [place] List of other Serum/Urine Drug Screening Test(s):  No results found for: AMPHSCRSER, [place], [place], [place], [place],  PCPSCRSER, PCPQUANT, THCSCRSER, THCU, CANNABQUANT, OPIATESCRSER, OXYSCRSER,  PROPOXSCRSER, ETH Historical Background Evaluation: [place] PMP: PDMP reviewed during this encounter. Online review of the past 12-month period conducted.             PMP NARX Score Report:  Narcotic: 431 Sedative: 170 Stimulant: 000 [place] Department of public safety, offender search: Engineer, mining Information) Non-contributory Risk Assessment Profile: Aberrant behavior: None observed or detected today Risk factors for fatal opioid overdose: None identified today PMP NARX Overdose Risk Score: 250 Fatal overdose hazard ratio (HR): Calculation deferred Non-fatal overdose hazard ratio (HR): Calculation deferred Risk of opioid abuse or dependence: 0.7-3.0% with doses ? 36 MME/day and 6.1-26% with doses ? 120 MME/day. Substance use disorder (SUD) risk level: See below Personal History of Substance Abuse (SUD-Substance use disorder):  Alcohol: Negative  Illegal Drugs: Negative  Rx Drugs: Negative  ORT Risk Level calculation: Low Risk  Opioid Risk Tool - 02/06/22 0919       Personal History of Substance Abuse   Alcohol Negative    Illegal Drugs Negative    Rx Drugs Negative      Psychological Disease   Psychological Disease Positive    ADD Negative    OCD Negative    Bipolar Negative    Schizophrenia Negative    Depression Positive   low energy     Total Score   Opioid Risk Tool Scoring 3    Opioid Risk Interpretation Low Risk            ORT Scoring interpretation table:  Score <3 = Low Risk for SUD  Score between 4-7 = Moderate Risk for SUD  Score >8 = High Risk for Opioid Abuse   PHQ-2 Depression Scale:  Total score: 2  PHQ-2 Scoring interpretation table: (Score and probability of major depressive disorder)  Score 0 = No depression  Score 1 = 15.4% Probability  Score 2 = 21.1% Probability  Score 3 = 38.4% Probability  Score 4 = 45.5% Probability  Score 5 = 56.4% Probability  Score 6 = 78.6% Probability   PHQ-9 Depression Scale:  Total score: 2  PHQ-9 Scoring  interpretation table:  Score 0-4 = No depression  Score 5-9 = Mild depression  Score 10-14 = Moderate depression  Score 15-19 = Moderately severe depression  Score 20-27 = Severe depression (2.4 times higher risk of SUD and 2.89 times higher risk of overuse)   Pharmacologic Plan: As per protocol, I have not taken over any controlled substance management, pending the results of ordered tests and/or consults.            Initial impression: Pending review of available data and ordered tests.  Meds   Current Outpatient Medications:    acetaminophen (TYLENOL) 500 MG tablet, Take 1,000 mg by mouth every 8 (eight) hours as needed for moderate pain., Disp: , Rfl:    acyclovir (ZOVIRAX) 200 MG capsule, Take 200 mg by mouth daily., Disp: , Rfl:    amLODipine (NORVASC) 2.5 MG tablet, Take 2.5 mg by mouth daily., Disp: , Rfl:    Ascorbic Acid (VITAMIN C) 1000 MG tablet, Take 1,000 mg by mouth daily., Disp: , Rfl:    atorvastatin (LIPITOR) 40 MG tablet, Take 40 mg by mouth daily at 6 PM., Disp: , Rfl:    calcium gluconate 500 MG tablet, Take 1 tablet by mouth daily., Disp: , Rfl:    Cholecalciferol (VITAMIN D3) 1000 units CAPS, Take 2,000 Units by mouth every morning., Disp: , Rfl:  dexamethasone (DECADRON) 4 MG tablet, Take 8 mg by mouth See admin instructions. Once a week, before [place] treatment., Disp: , Rfl:    docusate sodium (COLACE) 100 MG capsule, Take 1 capsule (100 mg total) by mouth 2 (two) times daily. (Patient taking differently: Take 100 mg by mouth 2 (two) times daily as needed for moderate constipation.), Disp: 10 capsule, Rfl: 0   DULoxetine (CYMBALTA) 60 MG capsule, Take 60 mg by mouth daily., Disp: , Rfl:    ferrous sulfate 325 (65 FE) MG tablet, Take 325 mg by mouth daily with breakfast., Disp: , Rfl:    hydrOXYzine (ATARAX/VISTARIL) 10 MG tablet, Take 10 mg by mouth 3 (three) times daily as needed for itching., Disp: , Rfl:    levothyroxine (SYNTHROID) 150 MCG tablet, Take 150  mcg by mouth daily before breakfast., Disp: , Rfl:    lidocaine-prilocaine (EMLA) cream, Apply 1 application topically as needed (dialysis access)., Disp: , Rfl:    NINLARO 3 MG capsule, Take 3 mg by mouth once a week. Once a week for 3 weeks in a 28 day cycle, Disp: , Rfl:    ondansetron (ZOFRAN) 8 MG tablet, Take 8 mg by mouth every 8 (eight) hours as needed for nausea or vomiting., Disp: , Rfl:    Oxycodone HCl 10 MG TABS, Take 10 mg by mouth 5 (five) times daily., Disp: , Rfl: 0   polyethylene glycol (MIRALAX / GLYCOLAX) packet, Take 17 g by mouth daily as needed for moderate constipation., Disp: , Rfl:    vitamin B-12 (CYANOCOBALAMIN) 1000 MCG tablet, Take 1,000 mcg by mouth every morning., Disp: , Rfl:   Imaging Review  Cervical Imaging: Cervical CT wo contrast: Results for orders placed during the hospital encounter of 02/26/19 CT Cervical Spine Wo Contrast  Narrative CLINICAL DATA:  Mechanical fall while using a walker, struck head on door knob, denies loss of consciousness, but takes Eliquis. Posterior scalp laceration. History diabetes mellitus, hypertension, stage III chronic kidney disease  EXAM: CT HEAD WITHOUT CONTRAST  CT CERVICAL SPINE WITHOUT CONTRAST  TECHNIQUE: Multidetector CT imaging of the head and cervical spine was performed following the standard protocol without intravenous contrast. Multiplanar CT image reconstructions of the cervical spine were also generated.  COMPARISON:  None  FINDINGS: CT HEAD FINDINGS  Brain: Generalized atrophy. Normal ventricular morphology. No midline shift or mass effect. Small vessel chronic ischemic changes of deep cerebral white matter. No intracranial hemorrhage, mass lesion, evidence of acute infarction, or extra-axial fluid collection.  Vascular: Mild atherosclerotic calcifications within internal carotid arteries at skull base  Skull: Hyperostosis frontalis interna. Calvaria intact. Scalp soft tissue swelling  RIGHT parietal.  Sinuses/Orbits: Clear  Other: N/A  CT CERVICAL SPINE FINDINGS  Alignment: Anterolisthesis C4-C5. Minimal anterolisthesis C7-T1. Remaining alignments normal.  Skull base and vertebrae: Mild osseous demineralization. Skull base intact. Vertebral body heights maintained. Disc space narrowing with endplate spur formation at C5-C6 and C6-C7. Multilevel facet degenerative changes. No fracture or bone destruction.  Soft tissues and spinal canal: Prevertebral soft tissues normal thickness. Central disc protrusion C3-C4 indenting thecal sac and ventral aspect of spinal cord. AP narrowing of spinal canal at C5-C6.  Disc levels:  As above  Upper chest: Lung apices clear  Other: N/A  IMPRESSION: Atrophy with small vessel chronic ischemic changes of deep cerebral white matter.  No acute intracranial abnormalities.  Multilevel degenerative disc and facet disease changes of the cervical spine degenerative type anterolisthesis at C4-C5 and C7-T1.  Central disc protrusion at  C3-C4 indenting thecal sac and ventral aspect of spinal cord.  AP narrowing of spinal canal at C5-C6.  No cervical spine fracture, additional subluxation, or bone destruction.   Electronically Signed By: Lavonia Dana M.D. On: 02/26/2019 15:36  Hip Imaging: Hip-L MR wo contrast: Results for orders placed during the hospital encounter of 06/02/18 MR HIP LEFT WO CONTRAST  Narrative CLINICAL DATA:  One day history of acute nontraumatic left hip and knee pain. Patient has a history of a right total hip arthroplasty and history of rheumatoid arthritis.  EXAM: MR OF THE LEFT HIP WITHOUT CONTRAST  TECHNIQUE: Multiplanar, multisequence MR imaging was performed. No intravenous contrast was administered.  COMPARISON:  CT scan from earlier today.  FINDINGS: A right hip prosthesis is noted. No obvious complicating features. The left hip demonstrates moderate degenerative changes with  joint space narrowing, spurring and subchondral cystic change versus erosions.  There is a large left hip joint effusion along with marked inflammation around the joint involving the surrounding musculature and extending into the abductor muscles. This could be due to acute flare up of rheumatoid arthritis, posttraumatic synovitis or possibly septic arthritis in the right clinical setting. This certainly would explain the patient's left hip and proximal thigh pain. I do not see a discrete muscle tear or intramuscular hematoma.  No fractures are identified. The pubic symphysis and SI joints are intact. Mild fatty atrophy of the gluteal and hip muscles bilaterally. No subcutaneous lesions.  No significant intrapelvic abnormalities are identified. No inguinal mass, hernia or adenopathy.  The femur is unremarkable. The surrounding thigh musculature appears intact. Bilateral knee joint effusions are noted incidentally and could be related to rheumatoid arthritis also.  IMPRESSION: 1. Large left hip joint effusion with significant surrounding inflammatory changes involving the hip/pelvic musculature. No hip fracture or other acute bony abnormality. Findings could be due to a flare up of the patient's rheumatoid arthritis, posttraumatic synovitis or possibly even septic arthritis in the right clinical setting. 2. No significant findings involving the left femur or left thigh musculature. 3. Bilateral knee joint effusions.   Electronically Signed By: Marijo Sanes M.D. On: 06/02/2018 13:31  Hip-L DG 2-3 views: Results for orders placed during the hospital encounter of 08/29/18 DG Hip Unilat With Pelvis 2-3 Views Left  Narrative CLINICAL DATA:  Pain following fall  EXAM: DG HIP (WITH OR WITHOUT PELVIS) 2-3V LEFT  COMPARISON:  CT pelvis June 02, 2018  FINDINGS: Frontal pelvis as well as frontal and lateral left hip images were obtained. There is a comminuted  intertrochanteric femur fracture on the left with varus angulation and impaction at the fracture site. There are fragmented greater and lesser trochanters on the left. There is a total hip replacement on the right. No fracture seen on the right, although there is mild lucency in the acetabular component region of the prosthesis, stable from prior CT. Bones are osteoporotic.  No evident dislocation. There is osteoarthritic change in each sacroiliac joint. There is degenerative change in the visualized lower lumbar spine.  IMPRESSION: 1. Comminuted intertrochanteric femur fracture on the left with varus angulation and impaction fracture site. No other fractures are evident.  2.  Bones diffusely osteoporotic.  3. Total hip replacement on the right. Mild lucency in the acetabular component region is stable compared to prior CT. A degree of loosening in this area cannot be excluded radiographically.  4. Degenerative type change in the sacroiliac joints and lower lumbar regions.   Electronically Signed By: Gwyndolyn Saxon  Jasmine December III M.D. On: 08/29/2018 15:02  Knee Imaging: Knee-R DG 4 views: Results for orders placed during the hospital encounter of 12/20/18 DG Knee Complete 4 Views Right  Narrative CLINICAL DATA:  Chronic right knee pain.  EXAM: RIGHT KNEE - COMPLETE 4+ VIEW  COMPARISON:  None.  FINDINGS: No acute fracture or dislocation. Small suprapatellar joint effusion. Moderate to severe medial patellofemoral compartment joint space narrowing with marginal osteophytes. Osteopenia. Soft tissues are unremarkable.  IMPRESSION: 1. Moderate to severe medial and patellofemoral compartment osteoarthritis.   Electronically Signed By: Titus Dubin M.D. On: 12/20/2018 14:23  Knee-L DG 4 views: Results for orders placed during the hospital encounter of 12/20/18 DG Knee Complete 4 Views Left  Narrative CLINICAL DATA:  Left knee pain.  Limited range of  motion.  EXAM: LEFT KNEE - COMPLETE 4+ VIEW  COMPARISON:  No recent.  FINDINGS: No acute bony or joint abnormality identified. Severe Tricompartment degenerative change, most prominent about the medial compartment and patellofemoral compartment. Chondrocalcinosis, most likely degenerative, noted. No effusion noted.  IMPRESSION: Diffuse tricompartment severe degenerative change. Degenerative changes most prominent about the medial and patellofemoral compartments. Chondrocalcinosis noted. No acute abnormality identified   Electronically Signed By: Marcello Moores  Register On: 12/20/2018 14:22  Complexity Note: Imaging results reviewed. Results shared with Ms. Westmoreland, using Layman's terms.                        ROS  Cardiovascular: Needs antibiotics prior to dental procedures Pulmonary or Respiratory: No reported pulmonary signs or symptoms such as wheezing and difficulty taking a deep full breath (Asthma), difficulty blowing air out (Emphysema), coughing up mucus (Bronchitis), persistent dry cough, or temporary stoppage of breathing during sleep Neurological: No reported neurological signs or symptoms such as seizures, abnormal skin sensations, urinary and/or fecal incontinence, being born with an abnormal open spine and/or a tethered spinal cord Psychological-Psychiatric: Anxiousness and Difficulty sleeping and or falling asleep Gastrointestinal: No reported gastrointestinal signs or symptoms such as vomiting or evacuating blood, reflux, heartburn, alternating episodes of diarrhea and constipation, inflamed or scarred liver, or pancreas or irrregular and/or infrequent bowel movements Genitourinary: Kidney disease and Difficulty producing urine (Renal failure) Hematological: No reported hematological signs or symptoms such as prolonged bleeding, low or poor functioning platelets, bruising or bleeding easily, hereditary bleeding problems, low energy levels due to low hemoglobin or being  anemic Endocrine: High blood sugar controlled without the use of insulin (NIDDM) and Slow thyroid Rheumatologic: Rheumatoid arthritis Musculoskeletal: Negative for myasthenia gravis, muscular dystrophy, multiple sclerosis or malignant hyperthermia Work History: Retired  Allergies  Ms. Lanter is allergic to byetta 10 mcg pen [exenatide], ibuprofen, lisinopril, and aspirin.  Laboratory Chemistry Profile   Renal Lab Results  Component Value Date   BUN 81 (H) 08/12/2021   CREATININE 7.32 (H) 08/12/2021   GFRAA 14 (L) 06/18/2020   GFRNONAA 5 (L) 08/12/2021   PROTEINUR 100 (A) 06/18/2020     Electrolytes Lab Results  Component Value Date   NA 139 08/12/2021   K 4.4 08/12/2021   CL 100 08/12/2021   CALCIUM 8.8 (L) 08/12/2021     Hepatic Lab Results  Component Value Date   AST 26 08/12/2021   ALT 16 08/12/2021   ALBUMIN 3.5 08/12/2021   ALKPHOS 75 08/12/2021     ID Lab Results  Component Value Date   [place] NEGATIVE 05/27/2021   MRSAPCR NEGATIVE 06/02/2018     Bone No results found for: [place], Metzger, [id number],  [id number], 25OHVITD1, 25OHVITD2, 25OHVITD3, TESTOFREE, TESTOSTERONE   Endocrine Lab Results  Component Value Date   GLUCOSE 106 (H) 08/12/2021   GLUCOSEU NEGATIVE 06/18/2020     Neuropathy No results found for: VITAMINB12, FOLATE, HGBA1C, HIV   CNS No results found for: COLORCSF, APPEARCSF, RBCCOUNTCSF, WBCCSF, POLYSCSF, LYMPHSCSF, EOSCSF, PROTEINCSF, GLUCCSF, JCVIRUS, CSFOLI, IGGCSF, LABACHR, ACETBL, LABACHR, ACETBL   Inflammation (CRP: Acute   ESR: Chronic) Lab Results  Component Value Date   ESRSEDRATE 57 (H) 06/02/2018   LATICACIDVEN 1.5 06/02/2018     Rheumatology No results found for: RF, ANA, LABURIC, URICUR, LYMEIGGIGMAB, LYMEABIGMQN, HLAB27   Coagulation Lab Results  Component Value Date   INR 1.1 05/27/2021   LABPROT 13.8 05/27/2021   APTT 25 08/29/2018   PLT 225 08/12/2021     Cardiovascular Lab Results  Component Value  Date   CKTOTAL 120 08/29/2018   HGB 12.1 08/12/2021   HCT 36.5 08/12/2021     Screening Lab Results  Component Value Date   SARSCOV2NAA NEGATIVE 05/27/2021   MRSAPCR NEGATIVE 06/02/2018     Cancer No results found for: CEA, CA125, LABCA2   Allergens No results found for: ALMOND, APPLE, ASPARAGUS, AVOCADO, BANANA, BARLEY, BASIL, BAYLEAF, GREENBEAN, LIMABEAN, WHITEBEAN, BEEFIGE, REDBEET, BLUEBERRY, BROCCOLI, CABBAGE, MELON, CARROT, CASEIN, CASHEWNUT, CAULIFLOWER, CELERY     Note: Lab results reviewed.  PFSH  Drug: Ms. Divirgilio  reports no history of drug use. Alcohol:  reports no history of alcohol use. Tobacco:  reports that she has never smoked. She has never used smokeless tobacco. Medical:  has a past medical history of Arthritis, Diabetes mellitus without complication ([place]), Hypertension, Kidney failure, and Proteinuria. Family: family history is not on file.  Past Surgical History:  Procedure Laterality Date   DIALYSIS/PERMA CATHETER INSERTION N/A 05/30/2021   Procedure: DIALYSIS/PERMA CATHETER INSERTION;  Surgeon: Algernon Huxley, MD;  Location: [facility];  Service: Cardiovascular;  Laterality: N/A;   DIALYSIS/PERMA CATHETER REMOVAL N/A 08/02/2021   Procedure: DIALYSIS/PERMA CATHETER REMOVAL;  Surgeon: Katha Cabal, MD;  Location: [facility];  Service: Cardiovascular;  Laterality: N/A;   EYE SURGERY     RENAL BIOPSY     TOTAL HIP ARTHROPLASTY Right    Active Ambulatory Problems    Diagnosis Date Noted   Pyelonephritis 07/02/2016   Hematuria 07/02/2016   Abdominal pain 07/02/2016   Acute on chronic renal failure ([place]) 07/02/2016   Leukocytosis 07/04/2016   E-coli UTI 07/04/2016   Essential hypertension, malignant 07/04/2016   CKD (chronic kidney disease), stage V ([place]) 06/02/2018   Acute pain of left hip 06/02/2018   Acute pain of left knee 06/02/2018   Inability to ambulate due to multiple joints 06/02/2018   Hip pain, acute 06/03/2018    Acquired hypothyroidism 02/02/2022   Anemia 01/21/2013   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   Bilateral lower extremity edema 02/08/2012   Bradycardia, sinus 05/03/2015   Cataracts, bilateral 12/28/2011   Chronic pain disorder 07/16/2012   Closed intertrochanteric fracture of hip, left, with routine healing, subsequent encounter 11/11/2018   Constipation 03/13/2012   H/O total hip arthroplasty 07/08/2012   Hyperlipidemia with target LDL less than 100 12/28/2011   Hypertension secondary to other renal disorders 06/22/2017   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 ([place]) 12/15/2020   Light chain deposition disease 07/25/2016   Neuropathy due to chemotherapeutic drug ([place]) 01/05/2021   Onychogryphosis 06/29/2015   Onychomycosis due to dermatophyte 06/29/2015   Osteoarthritis of right hip 02/02/2022  Osteoporosis, post-menopausal 12/28/2011   Pelvic relaxation due to uterovaginal prolapse, incomplete 02/02/2022   Premature contractions, atrial 05/03/2015   Primary osteoarthritis of left knee 04/10/2012   Recurrent major depressive disorder, in partial remission ([place]) 05/11/2020   Rheumatoid arthritis, seropositive ([place]) 03/06/2016   Rotator cuff arthropathy of left shoulder 11/11/2018   Secondary hyperparathyroidism ([place]) 01/31/2017   Chronic knee pain (1ry area of Pain) (Bilateral) (L>R) 06/18/2018   Encounter regarding vascular access for dialysis for ESRD ([place]) 01/05/2021   End stage renal disease ([place]) 02/02/2022   Chronic kidney disease, stage V (very severe) ([place]) 09/18/2012   Chronic pain syndrome 02/05/2022   Pharmacologic therapy 02/05/2022   Disorder of skeletal system 02/05/2022   Problems influencing health status 02/05/2022   Chronic shoulder pain (2ry area of Pain) (Bilateral) (L>R) 02/06/2022   Abnormal CT scan, cervical spine (02/26/2019) 02/06/2022   Resolved Ambulatory Problems    Diagnosis Date Noted   No Resolved Ambulatory  Problems   Past Medical History:  Diagnosis Date   Arthritis    Diabetes mellitus without complication ([place])    Hypertension    Kidney failure    Proteinuria    Constitutional Exam  General appearance: Well nourished, well developed, and well hydrated. In no apparent acute distress Vitals:   02/06/22 0901  BP: 140/66  Pulse: 83  Resp: 16  Temp: (!) 97.3 F (36.3 C)  SpO2: 100%  Weight: 135 lb (61.2 kg)  Height: $Remove'5\' 6"'voIJBdq$  (1.676 m)   BMI Assessment: Estimated body mass index is 21.79 kg/m as calculated from the following:   Height as of this encounter: $RemoveBeforeD'5\' 6"'qCuwZfdQsWEHZd$  (1.676 m).   Weight as of this encounter: 135 lb (61.2 kg).  BMI interpretation table: BMI level Category Range association with higher incidence of chronic pain  <18 kg/m2 Underweight   18.5-24.9 kg/m2 Ideal body weight   25-29.9 kg/m2 Overweight Increased incidence by 20%  30-34.9 kg/m2 Obese (Class I) Increased incidence by 68%  35-39.9 kg/m2 Severe obesity (Class II) Increased incidence by 136%  >40 kg/m2 Extreme obesity (Class III) Increased incidence by 254%   Patient's current BMI Ideal Body weight  Body mass index is 21.79 kg/m. Ideal body weight: 59.3 kg (130 lb 11.7 oz) Adjusted ideal body weight: 60.1 kg (132 lb 7 oz)   BMI Readings from Last 4 Encounters:  02/06/22 21.79 kg/m  05/30/21 28.98 kg/m  05/27/21 28.98 kg/m  06/18/20 29.12 kg/m   Wt Readings from Last 4 Encounters:  02/06/22 135 lb (61.2 kg)  05/30/21 174 lb 2.6 oz (79 kg)  05/27/21 174 lb 2.6 oz (79 kg)  06/18/20 175 lb (79.4 kg)    Psych/Mental status: Alert, oriented x 3 (person, place, & time)       Eyes: PERLA Respiratory: No evidence of acute respiratory distress  Assessment  Primary Diagnosis & Pertinent Problem List: The primary encounter diagnosis was Chronic pain syndrome. Diagnoses of Pharmacologic therapy, Disorder of skeletal system, Problems influencing health status, Chronic knee pain (1ry area of Pain) (Bilateral)  (L>R), Chronic shoulder pain (2ry area of Pain) (Bilateral) (L>R), and Abnormal CT scan, cervical spine (02/26/2019) were also pertinent to this visit.  Visit Diagnosis (New problems to examiner): 1. Chronic pain syndrome   2. Pharmacologic therapy   3. Disorder of skeletal system   4. Problems influencing health status   5. Chronic knee pain (1ry area of Pain) (Bilateral) (L>R)   6. Chronic shoulder pain (2ry area of Pain) (Bilateral) (L>R)   7.  Abnormal CT scan, cervical spine (02/26/2019)    Plan of Care (Initial workup plan)  Note: Ms. Welker was reminded that as per protocol, today's visit has been an evaluation only. We have not taken over the patient's controlled substance management.  Problem-specific plan: No problem-specific Assessment & Plan notes found for this encounter.  Lab Orders  No laboratory test(s) ordered today   Imaging Orders         DG Shoulder Right         DG Shoulder Left         DG Knee Complete 4 Views Right         DG Knee Complete 4 Views Left     Referral Orders  No referral(s) requested today   Procedure Orders    No procedure(s) ordered today   Pharmacotherapy (current): Medications ordered:  No orders of the defined types were placed in this encounter.  Medications administered during this visit: Latiffany Harwick had no medications administered during this visit.   Pharmacological management options:  Opioid Analgesics: At this time, we do not have the manpower to take over the patient's opioid medication regimen.  However, we do have some recommendations: The patient has been on opioids none stopped for the past 13 years.  Clearly the patient has tolerance to these opioids and every time that she goes to have dialysis, the opioids are removed from her system causing the patient to have increased pain after the dialysis for which she then tries to catch up by taking more opioids than prescribed.  At this time I would recommend for the oxycodone  IR dose to be switched to 5 mg pills in order to decrease her daily dose by 5 mg every 7 days.  This slow downward taper would take a total of 10 weeks to accomplish.  Once the patient gets to the point where she is not taking any oxycodone, it is important not to substitute the oxycodone for an older opioid.  The patient should spend a minimum of 14 days off of the opioids for the purpose of eliminating her tolerance.  After those 2 weeks, the patient should again be reassessed for the need to use the medication.  The patient should be placed back on an opioid but a mean at an MME/day of half of what she currently takes.  Opioid recommendations for patients on dialysis are included below.  Membrane stabilizer: To be determined at a later time  Muscle relaxant: To be determined at a later time  NSAID: To be determined at a later time  Other analgesic(s): To be determined at a later time   Dialysis Patients and opioids [Aronoff 1999; Dean 2004]  Opioid Recommended Use Comment  Morphine Use cautiously and monitor patient for rebound pain effect or do not use. Both parent drug and metabolites can be removed with dialysis; watch for rebound pain effect.  Hydromorphone/ Hydrocodone Use cautiously and monitor patient carefully for symptoms of opioid overdose. The parent drug can be removed, but metabolite accumulation is a risk.  Oxycodone Do not use. No data on oxycodone and its metabolites in dialysis.  Codeine Do not use. The parent drug and metabolites can accumulate causing adverse effects.  Methadone* Appears safe. Metabolites are inactive, but use caution because parent drug is not dialyzed.  Fentanyl* Appears safe. Metabolites are inactive, but use caution because fentanyl is poorly dialyzable.  * Use caution because these drugs are not dialyzable.   Interventional management options: Ms. Fuhs  was informed that there is no guarantee that she would be a candidate for interventional therapies.  The decision will be based on the results of diagnostic studies, as well as Ms. Toledo's risk profile.  Procedure(s) under consideration:  Pending results of ordered studies      Interventional Therapies  Risk   Complexity Considerations:   Estimated body mass index is 21.79 kg/m as calculated from the following:   Height as of this encounter: $RemoveBeforeD'5\' 6"'DmlThflmOjpAVt$  (1.676 m).   Weight as of this encounter: 135 lb (61.2 kg). Renal failure on dialysis (3x/week)  Multiple myeloma on chemotherapy (1x/month)    Planned   Pending:   At this point, we will not be taking over this patient's opioid analgesic regimen.  Please see above for recommendations.  Interventional therapies pending further evaluation.   Under consideration:   Diagnostic bilateral genicular nerve block with possible radiofrequency ablation follow-up. Diagnostic bilateral suprascapular nerve block with possible radiofrequency ablation follow-up.   Completed:   None at this time   Therapeutic   Palliative (PRN) options:   None established    Provider-requested follow-up: Return for (16min), Eval-day (M,W), (F2F), 2nd Visit, for review of ordered tests.  Future Appointments  Date Time Provider [place]  02/16/2022  1:00 PM Milinda Pointer, MD [facility] None     Note by: Gaspar Cola, MD Date: 02/06/2022; Time: 10:55 AM

## 2022-02-05 DIAGNOSIS — G894 Chronic pain syndrome: Secondary | ICD-10-CM | POA: Insufficient documentation

## 2022-02-05 DIAGNOSIS — Z79899 Other long term (current) drug therapy: Secondary | ICD-10-CM | POA: Insufficient documentation

## 2022-02-05 DIAGNOSIS — M899 Disorder of bone, unspecified: Secondary | ICD-10-CM | POA: Insufficient documentation

## 2022-02-05 DIAGNOSIS — Z789 Other specified health status: Secondary | ICD-10-CM | POA: Insufficient documentation

## 2022-02-06 ENCOUNTER — Encounter: Payer: Self-pay | Admitting: Pain Medicine

## 2022-02-06 ENCOUNTER — Ambulatory Visit: Payer: Medicare Other | Attending: Pain Medicine | Admitting: Pain Medicine

## 2022-02-06 ENCOUNTER — Other Ambulatory Visit: Payer: Self-pay

## 2022-02-06 VITALS — BP 140/66 | HR 83 | Temp 97.3°F | Resp 16 | Ht 66.0 in | Wt 135.0 lb

## 2022-02-06 DIAGNOSIS — M25512 Pain in left shoulder: Secondary | ICD-10-CM

## 2022-02-06 DIAGNOSIS — M25562 Pain in left knee: Secondary | ICD-10-CM | POA: Diagnosis present

## 2022-02-06 DIAGNOSIS — M899 Disorder of bone, unspecified: Secondary | ICD-10-CM | POA: Diagnosis present

## 2022-02-06 DIAGNOSIS — G8929 Other chronic pain: Secondary | ICD-10-CM | POA: Insufficient documentation

## 2022-02-06 DIAGNOSIS — Z79899 Other long term (current) drug therapy: Secondary | ICD-10-CM

## 2022-02-06 DIAGNOSIS — G894 Chronic pain syndrome: Secondary | ICD-10-CM

## 2022-02-06 DIAGNOSIS — R937 Abnormal findings on diagnostic imaging of other parts of musculoskeletal system: Secondary | ICD-10-CM

## 2022-02-06 DIAGNOSIS — M25511 Pain in right shoulder: Secondary | ICD-10-CM

## 2022-02-06 DIAGNOSIS — Z789 Other specified health status: Secondary | ICD-10-CM | POA: Diagnosis present

## 2022-02-06 DIAGNOSIS — M25561 Pain in right knee: Secondary | ICD-10-CM

## 2022-02-06 NOTE — Progress Notes (Signed)
Safety precautions to be maintained throughout the outpatient stay will include: orient to surroundings, keep bed in low position, maintain call bell within reach at all times, provide assistance with transfer out of bed and ambulation.  

## 2022-02-06 NOTE — Patient Instructions (Signed)
____________________________________________________________________________________________  Drug Holidays (Slow)  What is a "Drug Holiday"? Drug Holiday: is the name given to the period of time during which a patient stops taking a medication(s) for the purpose of eliminating tolerance to the drug.  Benefits . Improved effectiveness of opioids. . Decreased opioid dose needed to achieve benefits. . Improved pain with lesser dose.  What is tolerance? Tolerance: is the progressive decreased in effectiveness of a drug due to its repetitive use. With repetitive use, the body gets use to the medication and as a consequence, it loses its effectiveness. This is a common problem seen with opioid pain medications. As a result, a larger dose of the drug is needed to achieve the same effect that used to be obtained with a smaller dose.  How long should a "Drug Holiday" last? You should stay off of the pain medicine for at least 14 consecutive days. (2 weeks)  Should I stop the medicine "cold turkey"? No. You should always coordinate with your Pain Specialist so that he/she can provide you with the correct medication dose to make the transition as smoothly as possible.  How do I stop the medicine? Slowly. You will be instructed to decrease the daily amount of pills that you take by one (1) pill every seven (7) days. This is called a "slow downward taper" of your dose. For example: if you normally take four (4) pills per day, you will be asked to drop this dose to three (3) pills per day for seven (7) days, then to two (2) pills per day for seven (7) days, then to one (1) per day for seven (7) days, and at the end of those last seven (7) days, this is when the "Drug Holiday" would start.   Will I have withdrawals? By doing a "slow downward taper" like this one, it is unlikely that you will experience any significant withdrawal symptoms. Typically, what triggers withdrawals is the sudden stop of a high  dose opioid therapy. Withdrawals can usually be avoided by slowly decreasing the dose over a prolonged period of time. If you do not follow these instructions and decide to stop your medication abruptly, withdrawals may be possible.  What are withdrawals? Withdrawals: refers to the wide range of symptoms that occur after stopping or dramatically reducing opiate drugs after heavy and prolonged use. Withdrawal symptoms do not occur to patients that use low dose opioids, or those who take the medication sporadically. Contrary to benzodiazepine (example: Valium, Xanax, etc.) or alcohol withdrawals ("Delirium Tremens"), opioid withdrawals are not lethal. Withdrawals are the physical manifestation of the body getting rid of the excess receptors.  Expected Symptoms Early symptoms of withdrawal may include: . Agitation . Anxiety . Muscle aches . Increased tearing . Insomnia . Runny nose . Sweating . Yawning  Late symptoms of withdrawal may include: . Abdominal cramping . Diarrhea . Dilated pupils . Goose bumps . Nausea . Vomiting  Will I experience withdrawals? Due to the slow nature of the taper, it is very unlikely that you will experience any.  What is a slow taper? Taper: refers to the gradual decrease in dose.  (Last update: 06/30/2020) ____________________________________________________________________________________________     

## 2022-02-14 ENCOUNTER — Ambulatory Visit
Admission: RE | Admit: 2022-02-14 | Discharge: 2022-02-14 | Disposition: A | Discharge status: Home / Self Care | Payer: Medicare Other | Attending: Pain Medicine | Admitting: Pain Medicine

## 2022-02-14 ENCOUNTER — Ambulatory Visit
Admission: RE | Admit: 2022-02-14 | Discharge: 2022-02-14 | Disposition: A | Discharge status: Home / Self Care | Payer: Medicare Other | Source: Ambulatory Visit | Attending: Pain Medicine | Admitting: Pain Medicine

## 2022-02-14 DIAGNOSIS — M25511 Pain in right shoulder: Secondary | ICD-10-CM

## 2022-02-14 DIAGNOSIS — M25562 Pain in left knee: Secondary | ICD-10-CM | POA: Insufficient documentation

## 2022-02-14 DIAGNOSIS — M25512 Pain in left shoulder: Secondary | ICD-10-CM | POA: Diagnosis present

## 2022-02-14 DIAGNOSIS — M25561 Pain in right knee: Secondary | ICD-10-CM | POA: Diagnosis present

## 2022-02-14 DIAGNOSIS — G8929 Other chronic pain: Secondary | ICD-10-CM

## 2022-02-16 ENCOUNTER — Ambulatory Visit: Payer: Medicare Other | Attending: Pain Medicine | Admitting: Pain Medicine

## 2022-02-16 ENCOUNTER — Other Ambulatory Visit: Payer: Self-pay

## 2022-02-16 ENCOUNTER — Encounter: Payer: Self-pay | Admitting: Pain Medicine

## 2022-02-16 VITALS — BP 132/63 | HR 98 | Temp 97.2°F | Resp 20 | Ht 66.0 in | Wt 150.0 lb

## 2022-02-16 DIAGNOSIS — M25511 Pain in right shoulder: Secondary | ICD-10-CM | POA: Insufficient documentation

## 2022-02-16 DIAGNOSIS — S43001S Unspecified subluxation of right shoulder joint, sequela: Secondary | ICD-10-CM | POA: Insufficient documentation

## 2022-02-16 DIAGNOSIS — M5030 Other cervical disc degeneration, unspecified cervical region: Secondary | ICD-10-CM | POA: Insufficient documentation

## 2022-02-16 DIAGNOSIS — C9000 Multiple myeloma not having achieved remission: Secondary | ICD-10-CM | POA: Insufficient documentation

## 2022-02-16 DIAGNOSIS — M25562 Pain in left knee: Secondary | ICD-10-CM | POA: Diagnosis present

## 2022-02-16 DIAGNOSIS — M12811 Other specific arthropathies, not elsewhere classified, right shoulder: Secondary | ICD-10-CM | POA: Insufficient documentation

## 2022-02-16 DIAGNOSIS — M25461 Effusion, right knee: Secondary | ICD-10-CM | POA: Insufficient documentation

## 2022-02-16 DIAGNOSIS — M25512 Pain in left shoulder: Secondary | ICD-10-CM | POA: Diagnosis present

## 2022-02-16 DIAGNOSIS — N186 End stage renal disease: Secondary | ICD-10-CM | POA: Insufficient documentation

## 2022-02-16 DIAGNOSIS — M1711 Unilateral primary osteoarthritis, right knee: Secondary | ICD-10-CM | POA: Insufficient documentation

## 2022-02-16 DIAGNOSIS — S43002S Unspecified subluxation of left shoulder joint, sequela: Secondary | ICD-10-CM | POA: Diagnosis present

## 2022-02-16 DIAGNOSIS — M1612 Unilateral primary osteoarthritis, left hip: Secondary | ICD-10-CM | POA: Diagnosis present

## 2022-02-16 DIAGNOSIS — M059 Rheumatoid arthritis with rheumatoid factor, unspecified: Secondary | ICD-10-CM | POA: Diagnosis present

## 2022-02-16 DIAGNOSIS — M8580 Other specified disorders of bone density and structure, unspecified site: Secondary | ICD-10-CM | POA: Insufficient documentation

## 2022-02-16 DIAGNOSIS — M25452 Effusion, left hip: Secondary | ICD-10-CM | POA: Insufficient documentation

## 2022-02-16 DIAGNOSIS — M25561 Pain in right knee: Secondary | ICD-10-CM | POA: Diagnosis present

## 2022-02-16 DIAGNOSIS — Z992 Dependence on renal dialysis: Secondary | ICD-10-CM | POA: Insufficient documentation

## 2022-02-16 DIAGNOSIS — M1712 Unilateral primary osteoarthritis, left knee: Secondary | ICD-10-CM | POA: Diagnosis present

## 2022-02-16 DIAGNOSIS — M19012 Primary osteoarthritis, left shoulder: Secondary | ICD-10-CM | POA: Insufficient documentation

## 2022-02-16 DIAGNOSIS — M19011 Primary osteoarthritis, right shoulder: Secondary | ICD-10-CM | POA: Insufficient documentation

## 2022-02-16 DIAGNOSIS — G8929 Other chronic pain: Secondary | ICD-10-CM | POA: Diagnosis present

## 2022-02-16 DIAGNOSIS — M25462 Effusion, left knee: Secondary | ICD-10-CM | POA: Insufficient documentation

## 2022-02-16 DIAGNOSIS — G894 Chronic pain syndrome: Secondary | ICD-10-CM | POA: Diagnosis present

## 2022-02-16 DIAGNOSIS — M159 Polyosteoarthritis, unspecified: Secondary | ICD-10-CM | POA: Diagnosis present

## 2022-02-16 DIAGNOSIS — M12812 Other specific arthropathies, not elsewhere classified, left shoulder: Secondary | ICD-10-CM | POA: Insufficient documentation

## 2022-02-16 DIAGNOSIS — M170 Bilateral primary osteoarthritis of knee: Secondary | ICD-10-CM | POA: Diagnosis present

## 2022-02-16 DIAGNOSIS — M4312 Spondylolisthesis, cervical region: Secondary | ICD-10-CM | POA: Insufficient documentation

## 2022-02-16 DIAGNOSIS — M5020 Other cervical disc displacement, unspecified cervical region: Secondary | ICD-10-CM | POA: Diagnosis present

## 2022-02-16 NOTE — Progress Notes (Signed)
PROVIDER NOTE: Information contained herein reflects review and annotations entered in association with encounter. Interpretation of such information and data should be left to medically-trained personnel. Information provided to patient can be located elsewhere in the medical record under "Patient Instructions". Document created using STT-dictation technology, any transcriptional errors that may result from process are unintentional.    Patient: Christina Mercado  Service Category: E/M  Provider: Gaspar Cola, MD  DOB: 08-Mar-1936  DOS: 02/16/2022  Specialty: Interventional Pain Management  MRN: [id number]  Setting: Ambulatory outpatient  PCP: Myrtie Soman, MD  Type: Established Patient    Referring Provider: Myrtie Soman, MD  Location: Office  Delivery: Face-to-face     Primary Reason(s) for Visit: Encounter for evaluation before starting new chronic pain management plan of care (Level of risk: moderate) CC: Knee Pain (bilateral) and Shoulder Pain (bilateral)  HPI  Christina Mercado is a 86 y.o. year old, female patient, who comes today for a follow-up evaluation to review the test results and decide on a treatment plan. She has Pyelonephritis; Hematuria; Abdominal pain; Acute on chronic renal failure ([place]); Leukocytosis; E-coli UTI; Essential hypertension, malignant; CKD (chronic kidney disease), stage V ([place]); Inability to ambulate due to multiple joints; Acquired hypothyroidism; Anemia; Anxiety disorder due to medical condition; Bilateral lower extremity edema; Bradycardia, sinus; Cataracts, bilateral; Chronic pain disorder; Closed fracture of femur, intertrochanteric, sequela (Left); Constipation; History of total hip replacement (Right); Hyperlipidemia with target LDL less than 100; Hypertension secondary to other renal disorders; 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 [facility]); Light chain deposition disease; Neuropathy due to chemotherapeutic drug ([place]); Onychogryphosis; Onychomycosis due  to dermatophyte; Osteoporosis, post-menopausal; Pelvic relaxation due to uterovaginal prolapse, incomplete; Premature contractions, atrial; Osteoarthritis of knee (Left); Recurrent major depressive disorder, in partial remission ([place]); Rheumatoid arthritis, seropositive ([place]); Chronic rotator cuff arthropathy of shoulder (Left); Secondary hyperparathyroidism ([place]); Chronic knee pain (1ry area of Pain) (Bilateral) (L>R); Encounter regarding vascular access for dialysis for ESRD ([employer]); End stage renal disease ([place]); Chronic kidney disease, stage V (very severe) ([place]); Chronic pain syndrome; Pharmacologic therapy; Disorder of skeletal system; Problems influencing health status; Chronic shoulder pain (2ry area of Pain) (Bilateral) (L>R); Abnormal CT scan, cervical spine (02/26/2019); Multiple myeloma ([place]); ESRD on dialysis ([employer]); Tricompartment osteoarthritis of knees (Bilateral); Osteoarthritis of knees (Bilateral); Osteoarthritis of knee (Right); Osteoarthritis involving multiple joints; DDD (degenerative disc disease), cervical; Anterolisthesis of cervical spine (C4/C5 and C7/T1); Protrusion of cervical intervertebral disc (C3-4); Chronic rotator cuff arthropathy of shoulder (Right); Superior shoulder subluxation, sequela (Right); Osteoarthritis of AC (acromioclavicular) joint (Right); Osteoarthritis of glenohumeral joint (Right); Osteoarthritis of glenohumeral joint (Left); Shoulder subluxation, sequela (Left); Osteoarthritis of hip (Left); Effusion of knee joints (Bilateral); Hip joint effusion (Left); Osteopenia determined by x-ray; and Patellofemoral arthritis of knee (Left) on their problem list. Her primarily concern today is the Knee Pain (bilateral) and Shoulder Pain (bilateral)  Pain Assessment: Location: Right, Left Knee (shoulders bilaterally, constant aching and shooting) Radiating: below the knee, bilaterally and laterally Onset: More than a month ago Duration: Chronic pain Quality: Pressure,  Aching Severity: 6 /10 (subjective, self-reported pain score)  Effect on ADL:   Timing: Constant Modifying factors: ice, heat, topicals, opioids BP: 132/63   HR: 98  Christina Mercado comes in today for a follow-up visit after her initial evaluation on 02/06/2022. Today we went over the results of her tests. These were explained in "Layman's terms". During today's appointment we went over my diagnostic impression, as well as the proposed treatment plan.  Review of initial  evaluation (02/06/2022): "She is very hard of hearing and he has provided [place] with most of the needed information and he has also occasionally helped her understand the things that we were trying to get across.  According to the patient the primary area of pain is that of the knees (Bilateral) (L>R).  They deny ever having had any knee surgery, but they indicate having had physical therapy as well as Occupational Therapy.  They indicate that several years ago she had bilateral knee injection x1 done by Ricardo Jericho, MD (orthopedic surgeon).  Apparently the injection did not help and therefore it was not repeated.  According to them it was an injection to "provide cushion to the knee".  They indicate that they have been told that she has "bone-on-bone".  The patient's secondary area pain is that of the shoulders (Bilateral) (L>R).  Again they deny any shoulder surgery, recent x-rays, but they do indicate her to have had physical therapy and Occupational Therapy.  They deny any joint injections or nerve blocks.  Interventional pain management: Apparently the only thing that she has had is bilateral intra-articular knee joint injection x1 with Visco supplementation therapy, which apparently did not help and she did not have repeated.  Because of that failure, they seem to be somewhat hesitant in considering other interventional therapies.  Other medical problems affecting her pain include multiple myeloma, kidney failure on dialysis, rheumatoid  arthritis with positive rheumatoid factor.  She has had bilateral hip surgery, but other than complaining of one leg being shorter than the other, she is currently not having any hip pain.  She is currently living in an assisted living facility and they indicate having been to the [facility] clinic in [place] where they recommended to continue therapy as is.  Pharmacotherapy: Other than Tylenol, and all other over-the-counter creams, they indicated the patient to be taking oxycodone IR 10 mg 5 times per day which apparently she has been taking for the past 13 years, nonstop.  The patient's pain medication seems to currently be prescribed by the patient's referring physician, Dr. Myrtie Soman.   Other treatments: The patient apparently undergoes dialysis 3 times per week for her kidney failure and chemotherapy once per month for her multiple myeloma."  The patient returns today again accompanied by her son since she is very hard of hearing.  Today I have gone over the results of her shoulder and knee x-rays, both of which demonstrate that the patient has significant osteoarthritis of those joints.  Previously, the patient's son had expressed being hesitant to the use of any interventional therapies, secondary to the failure of Dr. Ebony Hail Toth's viscosupplementation knee injection to provide her with any substantial benefit.  Unfortunately, I do not have the notes from that procedure and therefore I do not have any details as to what was injected on that day.  In any case, today I spent a significant amount of time going over what we can do for each 1 of these joints.  In the case of the knee joints, I have explained that there is a stepladder that we normally follow, starting with intra-articular knee joint injections using regular Depo steroids.  If by any chance this provides patient with some benefit, but it does not last the long enough, then the second step would be to repeat the injection  using a slow release steroid like this Zilretta (triamcinolone ER).  The third possibility would be to do viscosupplementation injections.  I have explained  that they are several different kinds of viscosupplementation injections that we may be able to offer.  I explained that there are some that consist of only 1 injection (Monovisc), some that would consist of 3 injection Joan Mayans), as well as all others that may require up to 5 injections (Hyalgan).  I have also informed them that should she not be a good candidate for knee joint replacement or she was against doing that, an alternative to those options would include doing diagnostic genicular nerve blocks, which if effective, could then be followed by radiofrequency ablation.  In the case of the shoulder joints, we could again follow a similar pattern starting with intra-articular steroid injections, which if ineffective, we could follow with diagnostic suprascapular nerve blocks and perhaps radiofrequency ablation.  Once I finished covering what we could offer from the interventional standpoint, I then went on to address the issue of her opioid analgesics.  She is currently taking oxycodone IR 10 mg 5 times a day for a total of 50 mg/day of oxycodone or the equivalent of a 75 MME/day.  I have explained that this is above the 50 MME/day recommended by the CDC as the maximum safe dose.  I then proceeded to explain the issue of possible drug to drug interactions that may trigger respiratory depression and death.  Because they previously had indicated that the patient has been taking opioids for many years, nonstop, it is clear that she has developed a significant amount of tolerance.  I explained to them what tolerance was. I took the opportunity to explain the concept of tolerance and how "Drug Holidays" help control it.  I detailed how to do a slow taper to avoid withdrawals.  I have explained to them how I do not think that increasing the patient's dose  or doing an opioid rotation would be effective since both of them what essentially amounts to "kicking the can down the road".  I also explained to them how I do not feel comfortable monitoring opioids on a patient that has end-stage renal disease and undergoing dialysis since most will be anuric and therefore difficult to follow with urine drug screening test.  In the past I have attempted to work with the dialysis team to get drug screening test when they are doing the labs for dialysis, but this has proven to be very inefficient and ineffective.   After having explained all of the above to the patient, I then proceeded to ask them about her preferences.  They indicated that it was a lot to process and that they would have to consult with another family member.  I have explained to them that should they choose not to take advantage of the interventional therapies, it would be rather difficult for [place] to take on their case for simple medication management since we are currently short staffed.  I have also informed them that even if they do want to proceed with the interventional options, I am currently at a point in my practice that I can not add any more patients for the medication component.  Therefore, I have informed them that they need to go back to their primary care physician to see if they would be willing to continue their medications as is, while maintaining adequate monitoring of her condition and providing adjustments as needed.  Because they did not have answers to any of the above questions, I have recommended that they go back and find out about those and then give [place] a  call with their answer.  In any case, if they do decide to come back for the procedures and the medication management, I have introduced him to Dr. Holley Raring, as he may be able to assist them with both, the interventional and the pharmacological component of her pain management.  They understood and accepted.  Controlled Substance  Pharmacotherapy Assessment REMS (Risk Evaluation and Mitigation Strategy)  Opioid Analgesic: Oxycodone IR 10 mg tablet, 1 tab p.o. 5 times per day (#150/mo) (last filled on 02/02/2022) MME/day: 75 mg/day  Pill Count: None expected due to no prior prescriptions written by our practice. Hart Rochester, RN  02/16/2022  1:04 PM  Signed Safety precautions to be maintained throughout the outpatient stay will include: orient to surroundings, keep bed in low position, maintain call bell within reach at all times, provide assistance with transfer out of bed and ambulation.    Pharmacokinetics: Liberation and absorption (onset of action): WNL Distribution (time to peak effect): WNL Metabolism and excretion (duration of action): WNL         Pharmacodynamics: Desired effects: Analgesia: Christina Mercado reports >50% benefit. Functional ability: Patient reports that medication allows her to accomplish basic ADLs Clinically meaningful improvement in function (CMIF): Sustained CMIF goals met Perceived effectiveness: Described as relatively effective, allowing for increase in activities of daily living (ADL) Undesirable effects: Side-effects or Adverse reactions: None reported Monitoring: [place] PMP: PDMP reviewed during this encounter. Online review of the past 79-monthperiod previously conducted. Not applicable at this point since we have not taken over the patient's medication management yet. List of other Serum/Urine Drug Screening Test(s):  No results found.  Unable to provide sample secondary to ESRD anuria (on dialysis). List of all UDS test(s) done:  No results found.  Unable to provide sample secondary to ESRD anuria (on dialysis). Last UDS on record: No results found.  Unable to provide sample secondary to ESRD anuria (on dialysis).  Risk Assessment Profile: Aberrant behavior: See initial evaluations. None observed or detected today Comorbid factors increasing risk of overdose: See initial  evaluation. No additional risks detected today Opioid risk tool (ORT):  Opioid Risk  02/06/2022  Alcohol 0  Illegal Drugs 0  Rx Drugs 0  Psychological Disease 2  ADD Negative  OCD Negative  Bipolar Negative  Depression 1  Opioid Risk Tool Scoring 3  Opioid Risk Interpretation Low Risk    ORT Scoring interpretation table:  Score <3 = Low Risk for SUD  Score between 4-7 = Moderate Risk for SUD  Score >8 = High Risk for Opioid Abuse   Risk of substance use disorder (SUD): Low  Risk Mitigation Strategies:  Patient opioid safety counseling: No controlled substances prescribed. Patient-Prescriber Agreement (PPA): No agreement signed.  Controlled substance notification to other providers: None required. No opioid therapy.  Pharmacologic Plan: Non-opioid analgesic therapy offered. Interventional alternatives discussed.             Laboratory Chemistry Profile   Renal Lab Results  Component Value Date   BUN 81 (H) 08/12/2021   CREATININE 7.32 (H) 08/12/2021   GFRAA 14 (L) 06/18/2020   GFRNONAA 5 (L) 08/12/2021   PROTEINUR 100 (A) 06/18/2020     Electrolytes Lab Results  Component Value Date   NA 139 08/12/2021   K 4.4 08/12/2021   CL 100 08/12/2021   CALCIUM 8.8 (L) 08/12/2021     Hepatic Lab Results  Component Value Date   AST 26 08/12/2021   ALT 16 08/12/2021   ALBUMIN  3.5 08/12/2021   ALKPHOS 75 08/12/2021     ID Lab Results  Component Value Date   [place] NEGATIVE 05/27/2021   MRSAPCR NEGATIVE 06/02/2018     Bone No results found for: VD25OH, [id number], AU6333LK5, GY5638LH7, 25OHVITD1, 25OHVITD2, 25OHVITD3, TESTOFREE, TESTOSTERONE   Endocrine Lab Results  Component Value Date   GLUCOSE 106 (H) 08/12/2021   GLUCOSEU NEGATIVE 06/18/2020     Neuropathy No results found for: VITAMINB12, FOLATE, HGBA1C, HIV   CNS No results found for: COLORCSF, APPEARCSF, RBCCOUNTCSF, WBCCSF, POLYSCSF, LYMPHSCSF, EOSCSF, PROTEINCSF, GLUCCSF, JCVIRUS, CSFOLI,  IGGCSF, LABACHR, ACETBL, LABACHR, ACETBL   Inflammation (CRP: Acute   ESR: Chronic) Lab Results  Component Value Date   ESRSEDRATE 57 (H) 06/02/2018   LATICACIDVEN 1.5 06/02/2018     Rheumatology No results found for: RF, ANA, LABURIC, URICUR, LYMEIGGIGMAB, LYMEABIGMQN, HLAB27   Coagulation Lab Results  Component Value Date   INR 1.1 05/27/2021   LABPROT 13.8 05/27/2021   APTT 25 08/29/2018   PLT 225 08/12/2021     Cardiovascular Lab Results  Component Value Date   CKTOTAL 120 08/29/2018   HGB 12.1 08/12/2021   HCT 36.5 08/12/2021     Screening Lab Results  Component Value Date   SARSCOV2NAA NEGATIVE 05/27/2021   MRSAPCR NEGATIVE 06/02/2018     Cancer No results found for: CEA, CA125, LABCA2   Allergens No results found for: ALMOND, APPLE, ASPARAGUS, AVOCADO, BANANA, BARLEY, BASIL, BAYLEAF, GREENBEAN, LIMABEAN, WHITEBEAN, BEEFIGE, REDBEET, BLUEBERRY, BROCCOLI, CABBAGE, MELON, CARROT, CASEIN, CASHEWNUT, CAULIFLOWER, CELERY     Note: Lab results reviewed.  Recent Diagnostic Imaging Review  Cervical Imaging: Cervical CT wo contrast: Results for orders placed during the hospital encounter of 02/26/19 CT Cervical Spine Wo Contrast  Narrative CLINICAL DATA:  Mechanical fall while using a walker, struck head on door knob, denies loss of consciousness, but takes Eliquis. Posterior scalp laceration. History diabetes mellitus, hypertension, stage III chronic kidney disease  EXAM: CT HEAD WITHOUT CONTRAST  CT CERVICAL SPINE WITHOUT CONTRAST  TECHNIQUE: Multidetector CT imaging of the head and cervical spine was performed following the standard protocol without intravenous contrast. Multiplanar CT image reconstructions of the cervical spine were also generated.  COMPARISON:  None  FINDINGS: CT HEAD FINDINGS  Brain: Generalized atrophy. Normal ventricular morphology. No midline shift or mass effect. Small vessel chronic ischemic changes of deep cerebral white  matter. No intracranial hemorrhage, mass lesion, evidence of acute infarction, or extra-axial fluid collection.  Vascular: Mild atherosclerotic calcifications within internal carotid arteries at skull base  Skull: Hyperostosis frontalis interna. Calvaria intact. Scalp soft tissue swelling RIGHT parietal.  Sinuses/Orbits: Clear  Other: N/A  CT CERVICAL SPINE FINDINGS  Alignment: Anterolisthesis C4-C5. Minimal anterolisthesis C7-T1. Remaining alignments normal.  Skull base and vertebrae: Mild osseous demineralization. Skull base intact. Vertebral body heights maintained. Disc space narrowing with endplate spur formation at C5-C6 and C6-C7. Multilevel facet degenerative changes. No fracture or bone destruction.  Soft tissues and spinal canal: Prevertebral soft tissues normal thickness. Central disc protrusion C3-C4 indenting thecal sac and ventral aspect of spinal cord. AP narrowing of spinal canal at C5-C6.  Disc levels:  As above  Upper chest: Lung apices clear  Other: N/A  IMPRESSION: Atrophy with small vessel chronic ischemic changes of deep cerebral white matter.  No acute intracranial abnormalities.  Multilevel degenerative disc and facet disease changes of the cervical spine degenerative type anterolisthesis at C4-C5 and C7-T1.  Central disc protrusion at C3-C4 indenting thecal sac and ventral aspect of spinal  cord.  AP narrowing of spinal canal at C5-C6.  No cervical spine fracture, additional subluxation, or bone destruction.   Electronically Signed By: Lavonia Dana M.D. On: 02/26/2019 15:36  Shoulder Imaging: Shoulder-R DG: Results for orders placed during the hospital encounter of 02/14/22 DG Shoulder Right  Narrative CLINICAL DATA:  Chronic bilateral shoulder pain.  EXAM: RIGHT SHOULDER - 2+ VIEW  COMPARISON:  None.  FINDINGS: Superior subluxation of the humeral head abutting the undersurface of the acromion with associated spurring and  subchondral cystic change. There is glenohumeral joint space narrowing. Moderate acromioclavicular degenerative change. Subcortical cystic change in the lateral humeral head. No fracture, erosion, or bony destruction. No soft tissue calcifications.  IMPRESSION: 1. Chronic rotator cuff arthropathy with superior subluxation of the humeral head abutting the undersurface of the acromion. 2. Moderate acromioclavicular and glenohumeral osteoarthritis.   Electronically Signed By: Keith Rake M.D. On: 02/15/2022 01:34  Shoulder-L DG: Results for orders placed during the hospital encounter of 02/14/22 DG Shoulder Left  Narrative CLINICAL DATA:  Chronic bilateral shoulder pain.  EXAM: LEFT SHOULDER - 2+ VIEW  COMPARISON:  None.  FINDINGS: Superior subluxation of the humeral head abutting the undersurface of the acromion with spurring and subchondral cystic change. Advanced glenohumeral osteoarthritis with joint space narrowing, spurring, and subchondral cysts. Moderate acromioclavicular degenerative change. No fracture, erosion, or focal bone abnormality. No soft tissue calcifications.  IMPRESSION: 1. Advanced glenohumeral osteoarthritis. 2. Superior subluxation of the humeral head abutting the undersurface of the acromion consistent with chronic rotator cuff arthropathy.   Electronically Signed By: Keith Rake M.D. On: 02/15/2022 01:35  Hip Imaging: Hip-L MR wo contrast: Results for orders placed during the hospital encounter of 06/02/18 MR HIP LEFT WO CONTRAST  Narrative CLINICAL DATA:  One day history of acute nontraumatic left hip and knee pain. Patient has a history of a right total hip arthroplasty and history of rheumatoid arthritis.  EXAM: MR OF THE LEFT HIP WITHOUT CONTRAST  TECHNIQUE: Multiplanar, multisequence MR imaging was performed. No intravenous contrast was administered.  COMPARISON:  CT scan from earlier today.  FINDINGS: A right hip  prosthesis is noted. No obvious complicating features. The left hip demonstrates moderate degenerative changes with joint space narrowing, spurring and subchondral cystic change versus erosions.  There is a large left hip joint effusion along with marked inflammation around the joint involving the surrounding musculature and extending into the abductor muscles. This could be due to acute flare up of rheumatoid arthritis, posttraumatic synovitis or possibly septic arthritis in the right clinical setting. This certainly would explain the patient's left hip and proximal thigh pain. I do not see a discrete muscle tear or intramuscular hematoma.  No fractures are identified. The pubic symphysis and SI joints are intact. Mild fatty atrophy of the gluteal and hip muscles bilaterally. No subcutaneous lesions.  No significant intrapelvic abnormalities are identified. No inguinal mass, hernia or adenopathy.  The femur is unremarkable. The surrounding thigh musculature appears intact. Bilateral knee joint effusions are noted incidentally and could be related to rheumatoid arthritis also.  IMPRESSION: 1. Large left hip joint effusion with significant surrounding inflammatory changes involving the hip/pelvic musculature. No hip fracture or other acute bony abnormality. Findings could be due to a flare up of the patient's rheumatoid arthritis, posttraumatic synovitis or possibly even septic arthritis in the right clinical setting. 2. No significant findings involving the left femur or left thigh musculature. 3. Bilateral knee joint effusions.   Electronically Signed By: Mamie Nick.  Gallerani M.D. On: 06/02/2018 13:31  Hip-L DG 2-3 views: Results for orders placed during the hospital encounter of 08/29/18 DG Hip Unilat With Pelvis 2-3 Views Left  Narrative CLINICAL DATA:  Pain following fall  EXAM: DG HIP (WITH OR WITHOUT PELVIS) 2-3V LEFT  COMPARISON:  CT pelvis June 02, 2018  FINDINGS: Frontal pelvis as well as frontal and lateral left hip images were obtained. There is a comminuted intertrochanteric femur fracture on the left with varus angulation and impaction at the fracture site. There are fragmented greater and lesser trochanters on the left. There is a total hip replacement on the right. No fracture seen on the right, although there is mild lucency in the acetabular component region of the prosthesis, stable from prior CT. Bones are osteoporotic.  No evident dislocation. There is osteoarthritic change in each sacroiliac joint. There is degenerative change in the visualized lower lumbar spine.  IMPRESSION: 1. Comminuted intertrochanteric femur fracture on the left with varus angulation and impaction fracture site. No other fractures are evident.  2.  Bones diffusely osteoporotic.  3. Total hip replacement on the right. Mild lucency in the acetabular component region is stable compared to prior CT. A degree of loosening in this area cannot be excluded radiographically.  4. Degenerative type change in the sacroiliac joints and lower lumbar regions.   Electronically Signed By: Lowella Grip III M.D. On: 08/29/2018 15:02  Knee Imaging: Knee-R DG 4 views: Results for orders placed during the hospital encounter of 02/14/22 DG Knee Complete 4 Views Right  Narrative CLINICAL DATA:  Chronic bilateral knee pain.  Arthralgia.  EXAM: RIGHT KNEE - COMPLETE 4+ VIEW  COMPARISON:  12/20/2018  FINDINGS: Medial tibiofemoral joint space narrowing. Patellofemoral joint space narrowing. Subchondral cystic change in the patella. Moderate tricompartmental peripheral spurring. There is a small knee joint effusion. No fracture, erosion, or focal bone abnormality.  IMPRESSION: Moderate tricompartmental osteoarthritis. Small joint effusion.   Electronically Signed By: Keith Rake M.D. On: 02/15/2022 01:36  Knee-L DG 4 views: Results  for orders placed during the hospital encounter of 02/14/22 DG Knee Complete 4 Views Left  Narrative CLINICAL DATA:  Chronic bilateral knee pain.  Through ulcer  EXAM: LEFT KNEE - COMPLETE 4+ VIEW  COMPARISON:  12/20/2018  FINDINGS: Patellofemoral joint space narrowing. Moderate tricompartmental peripheral spurring. Subchondral cystic change in the patellofemoral compartment. Trace knee joint effusion. No fracture, erosion, or focal bone abnormality. Peripheral vascular calcifications.  IMPRESSION: Tricompartmental osteoarthritis, most prominent in the patellofemoral compartment. Trace knee joint effusion.   Electronically Signed By: Keith Rake M.D. On: 02/15/2022 01:37  Complexity Note: Imaging results reviewed. Results shared with Christina Mercado, using Layman's terms.                        Meds   Current Outpatient Medications:    acetaminophen (TYLENOL) 500 MG tablet, Take 1,000 mg by mouth every 8 (eight) hours as needed for moderate pain., Disp: , Rfl:    acyclovir (ZOVIRAX) 200 MG capsule, Take 200 mg by mouth daily., Disp: , Rfl:    amLODipine (NORVASC) 2.5 MG tablet, Take 2.5 mg by mouth daily., Disp: , Rfl:    Ascorbic Acid (VITAMIN C) 1000 MG tablet, Take 1,000 mg by mouth daily., Disp: , Rfl:    atorvastatin (LIPITOR) 40 MG tablet, Take 40 mg by mouth daily at 6 PM., Disp: , Rfl:    calcium gluconate 500 MG tablet, Take 1 tablet by mouth  daily., Disp: , Rfl:    Cholecalciferol (VITAMIN D3) 1000 units CAPS, Take 2,000 Units by mouth every morning., Disp: , Rfl:    dexamethasone (DECADRON) 4 MG tablet, Take 8 mg by mouth See admin instructions. Once a week, before [place] treatment., Disp: , Rfl:    docusate sodium (COLACE) 100 MG capsule, Take 1 capsule (100 mg total) by mouth 2 (two) times daily. (Patient taking differently: Take 100 mg by mouth 2 (two) times daily as needed for moderate constipation.), Disp: 10 capsule, Rfl: 0   DULoxetine (CYMBALTA) 60 MG  capsule, Take 60 mg by mouth daily., Disp: , Rfl:    ferrous sulfate 325 (65 FE) MG tablet, Take 325 mg by mouth daily with breakfast., Disp: , Rfl:    hydrOXYzine (ATARAX/VISTARIL) 10 MG tablet, Take 10 mg by mouth 3 (three) times daily as needed for itching., Disp: , Rfl:    levothyroxine (SYNTHROID) 150 MCG tablet, Take 150 mcg by mouth daily before breakfast., Disp: , Rfl:    lidocaine-prilocaine (EMLA) cream, Apply 1 application topically as needed (dialysis access)., Disp: , Rfl:    NINLARO 3 MG capsule, Take 3 mg by mouth once a week. Once a week for 3 weeks in a 28 day cycle, Disp: , Rfl:    ondansetron (ZOFRAN) 8 MG tablet, Take 8 mg by mouth every 8 (eight) hours as needed for nausea or vomiting., Disp: , Rfl:    Oxycodone HCl 10 MG TABS, Take 10 mg by mouth 5 (five) times daily., Disp: , Rfl: 0   polyethylene glycol (MIRALAX / GLYCOLAX) packet, Take 17 g by mouth daily as needed for moderate constipation., Disp: , Rfl:    vitamin B-12 (CYANOCOBALAMIN) 1000 MCG tablet, Take 1,000 mcg by mouth every morning., Disp: , Rfl:   ROS  Constitutional: Denies any fever or chills Gastrointestinal: No reported hemesis, hematochezia, vomiting, or acute GI distress Musculoskeletal: Denies any acute onset joint swelling, redness, loss of ROM, or weakness Neurological: No reported episodes of acute onset apraxia, aphasia, dysarthria, agnosia, amnesia, paralysis, loss of coordination, or loss of consciousness  Allergies  Christina Mercado is allergic to byetta 10 mcg pen [exenatide], ibuprofen, lisinopril, and aspirin.  PFSH  Drug: Christina Mercado  reports no history of drug use. Alcohol:  reports no history of alcohol use. Tobacco:  reports that she has never smoked. She has never used smokeless tobacco. Medical:  has a past medical history of Arthritis, Diabetes mellitus without complication ([place]), Hypertension, Kidney failure, and Proteinuria. Surgical: Christina Mercado  has a past surgical history that includes  Total hip arthroplasty (Right); Renal biopsy; DIALYSIS/PERMA CATHETER INSERTION (N/A, 05/30/2021); DIALYSIS/PERMA CATHETER REMOVAL (N/A, 08/02/2021); and Eye surgery. Family: family history is not on file.  Constitutional Exam  General appearance: Well nourished, well developed, and well hydrated. In no apparent acute distress Vitals:   02/16/22 1252  BP: 132/63  Pulse: 98  Resp: 20  Temp: (!) 97.2 F (36.2 C)  TempSrc: Temporal  SpO2: 100%  Weight: 150 lb (68 kg)  Height: _0  (1.676 m)   BMI Assessment: Estimated body mass index is 24.21 kg/m as calculated from the following:   Height as of this encounter: _1  (1.676 m).   Weight as of this encounter: 150 lb (68 kg).  BMI interpretation table: BMI level Category Range association with higher incidence of chronic pain  <18 kg/m2 Underweight   18.5-24.9 kg/m2 Ideal body weight   25-29.9 kg/m2 Overweight Increased incidence by 20%  30-34.9  kg/m2 Obese (Class I) Increased incidence by 68%  35-39.9 kg/m2 Severe obesity (Class II) Increased incidence by 136%  >40 kg/m2 Extreme obesity (Class III) Increased incidence by 254%   Patient's current BMI Ideal Body weight  Body mass index is 24.21 kg/m. Ideal body weight: 59.3 kg (130 lb 11.7 oz) Adjusted ideal body weight: 62.8 kg (138 lb 7 oz)   BMI Readings from Last 4 Encounters:  02/16/22 24.21 kg/m  02/06/22 21.79 kg/m  05/30/21 28.98 kg/m  05/27/21 28.98 kg/m   Wt Readings from Last 4 Encounters:  02/16/22 150 lb (68 kg)  02/06/22 135 lb (61.2 kg)  05/30/21 174 lb 2.6 oz (79 kg)  05/27/21 174 lb 2.6 oz (79 kg)    Psych/Mental status: Alert, oriented x 3 (person, place, & time)       Eyes: PERLA Respiratory: No evidence of acute respiratory distress  Assessment & Plan  Primary Diagnosis & Pertinent Problem List: The primary encounter diagnosis was Chronic pain syndrome. Diagnoses of Rheumatoid arthritis, seropositive ([place]), Multiple myeloma, remission status  unspecified ([place]), Chronic knee pain (1ry area of Pain) (Bilateral) (L>R), Tricompartment osteoarthritis of knees (Bilateral), Osteoarthritis of knees (Bilateral), Effusion of knee joints (Bilateral), Primary osteoarthritis of knee (Right), Osteoarthritis of knee (Left), Patellofemoral arthritis of knee (Left), Chronic shoulder pain (2ry area of Pain) (Bilateral) (L>R), Chronic rotator cuff arthropathy of shoulder (Right), Osteoarthritis of AC (acromioclavicular) joint (Right), Superior shoulder subluxation, sequela (Right), Osteoarthritis of glenohumeral joint (Right), Chronic rotator cuff arthropathy of shoulder (Left), Osteoarthritis of glenohumeral joint (Left), Shoulder subluxation, sequela (Left), Primary osteoarthritis involving multiple joints, Osteoarthritis of hip (Left), Hip joint effusion (Left), DDD (degenerative disc disease), cervical, Anterolisthesis of cervical spine (C4/C5 and C7/T1), Protrusion of cervical intervertebral disc (C3-4), Osteopenia determined by x-ray, End stage renal disease ([place]), and ESRD on dialysis [facility]) were also pertinent to this visit.  Visit Diagnosis: 1. Chronic pain syndrome   2. Rheumatoid arthritis, seropositive ([place])   3. Multiple myeloma, remission status unspecified ([place])   4. Chronic knee pain (1ry area of Pain) (Bilateral) (L>R)   5. Tricompartment osteoarthritis of knees (Bilateral)   6. Osteoarthritis of knees (Bilateral)   7. Effusion of knee joints (Bilateral)   8. Primary osteoarthritis of knee (Right)   9. Osteoarthritis of knee (Left)   10. Patellofemoral arthritis of knee (Left)   11. Chronic shoulder pain (2ry area of Pain) (Bilateral) (L>R)   12. Chronic rotator cuff arthropathy of shoulder (Right)   13. Osteoarthritis of AC (acromioclavicular) joint (Right)   14. Superior shoulder subluxation, sequela (Right)   15. Osteoarthritis of glenohumeral joint (Right)   16. Chronic rotator cuff arthropathy of shoulder (Left)   17. Osteoarthritis  of glenohumeral joint (Left)   18. Shoulder subluxation, sequela (Left)   19. Primary osteoarthritis involving multiple joints   20. Osteoarthritis of hip (Left)   21. Hip joint effusion (Left)   22. DDD (degenerative disc disease), cervical   23. Anterolisthesis of cervical spine (C4/C5 and C7/T1)   24. Protrusion of cervical intervertebral disc (C3-4)   25. Osteopenia determined by x-ray   26. End stage renal disease ([place])   27. ESRD on dialysis [facility])    Problems updated and reviewed during this visit: Problem  Multiple Myeloma (H[facility])  Tricompartment osteoarthritis of knees (Bilateral)  Osteoarthritis of knees (Bilateral)  Osteoarthritis of knee (Right)  Osteoarthritis involving multiple joints  Ddd (Degenerative Disc Disease), Cervical  Anterolisthesis of cervical spine (C4/C5 and C7/T1)  Protrusion of cervical intervertebral  disc (C3-4)   Central disc protrusion at C3-4 indenting thecal sac and ventral aspect of spinal cord. AP narrowing of spinal canal at C5-6.   Chronic rotator cuff arthropathy of shoulder (Right)   Chronic rotator cuff arthropathy with superior subluxation of the humeral head abutting the undersurface of the acromion.   Superior shoulder subluxation, sequela (Right)   Chronic rotator cuff arthropathy with superior subluxation of the humeral head abutting the undersurface of the acromion.   Osteoarthritis of AC (acromioclavicular) joint (Right)  Osteoarthritis of glenohumeral joint (Right)  Osteoarthritis of glenohumeral joint (Left)  Shoulder subluxation, sequela (Left)   Superior subluxation of the humeral head abutting the undersurface of the acromion consistent with chronic rotator cuff arthropathy.   Osteoarthritis of hip (Left)  Effusion of knee joints (Bilateral)  Hip joint effusion (Left)  Patellofemoral arthritis of knee (Left)  Closed fracture of femur, intertrochanteric, sequela (Left)  Chronic rotator cuff arthropathy of shoulder (Left)   History of total hip replacement (Right)  Osteoarthritis of knee (Left)  Esrd On Dialysis (Hcc)  Osteopenia Determined By X-Ray  End Stage Renal Disease (Hcc)  Ckd (Chronic Kidney Disease), Stage V (Hcc)  Chronic Kidney Disease, Stage V (Very Severe) (Hcc)  Osteoarthritis of Right Hip (Resolved)   Formatting of this note might be different from the original. PREOP APPT FOR SURGERY 05/01/12 [facility]   Acute Pain of Left Hip (Resolved)  Acute Pain of Left Knee (Resolved)    Plan of Care  Pharmacotherapy (Medications Ordered): No orders of the defined types were placed in this encounter.  Procedure Orders    No procedure(s) ordered today   Lab Orders  No laboratory test(s) ordered today   Imaging Orders  No imaging studies ordered today   Referral Orders  No referral(s) requested today    Pharmacological management options:  Opioid Analgesics: I will not be prescribing any opioids at this time Membrane stabilizer: I will not be prescribing any at this time Muscle relaxant: I will not be prescribing any at this time NSAID: I will not be prescribing any at this time Other analgesic(s): I will not be prescribing any at this time      Interventional Therapies  Risk   Complexity Considerations:   Estimated body mass index is 21.79 kg/m as calculated from the following:   Height as of this encounter: _0  (1.676 m).   Weight as of this encounter: 135 lb (61.2 kg). Renal failure on dialysis (3x/week)  Multiple myeloma on chemotherapy (1x/month)    Planned   Pending:   At this point, we will not be taking over this patient's opioid analgesic regimen.  Please see above for recommendations.  Interventional therapies pending further evaluation.   Under consideration:   Diagnostic bilateral genicular nerve block with possible radiofrequency ablation follow-up. Diagnostic bilateral suprascapular nerve block with possible radiofrequency ablation follow-up.   Completed:   None at  this time   Therapeutic   Palliative (PRN) options:   None established     Provider-requested follow-up: Return if symptoms worsen or fail to improve. Recent Visits Date Type Provider Dept  02/06/22 Office Visit Milinda Pointer, MD [facility]  Showing recent visits within past 90 days and meeting all other requirements Today's Visits Date Type Provider Dept  02/16/22 Office Visit Milinda Pointer, MD [facility]  Showing today's visits and meeting all other requirements Future Appointments No visits were found meeting these conditions. Showing future appointments within next 90 days and meeting all  other requirements  Primary Care Physician: Myrtie Soman, MD Note by: Gaspar Cola, MD Date: 02/16/2022; Time: 5:36 PM

## 2022-02-16 NOTE — Progress Notes (Signed)
Safety precautions to be maintained throughout the outpatient stay will include: orient to surroundings, keep bed in low position, maintain call bell within reach at all times, provide assistance with transfer out of bed and ambulation.  

## 2022-03-27 ENCOUNTER — Encounter: Payer: Self-pay | Admitting: Emergency Medicine

## 2022-03-27 ENCOUNTER — Emergency Department: Payer: Medicare Other

## 2022-03-27 ENCOUNTER — Other Ambulatory Visit: Payer: Self-pay

## 2022-03-27 DIAGNOSIS — S59902A Unspecified injury of left elbow, initial encounter: Secondary | ICD-10-CM | POA: Diagnosis present

## 2022-03-27 DIAGNOSIS — E1165 Type 2 diabetes mellitus with hyperglycemia: Secondary | ICD-10-CM | POA: Diagnosis not present

## 2022-03-27 DIAGNOSIS — W01198A Fall on same level from slipping, tripping and stumbling with subsequent striking against other object, initial encounter: Secondary | ICD-10-CM | POA: Insufficient documentation

## 2022-03-27 DIAGNOSIS — R4182 Altered mental status, unspecified: Secondary | ICD-10-CM | POA: Insufficient documentation

## 2022-03-27 DIAGNOSIS — S42412A Displaced simple supracondylar fracture without intercondylar fracture of left humerus, initial encounter for closed fracture: Secondary | ICD-10-CM | POA: Diagnosis not present

## 2022-03-27 DIAGNOSIS — Z79899 Other long term (current) drug therapy: Secondary | ICD-10-CM | POA: Diagnosis not present

## 2022-03-27 DIAGNOSIS — Z20822 Contact with and (suspected) exposure to covid-19: Secondary | ICD-10-CM | POA: Insufficient documentation

## 2022-03-27 DIAGNOSIS — N186 End stage renal disease: Secondary | ICD-10-CM | POA: Insufficient documentation

## 2022-03-27 DIAGNOSIS — Y92129 Unspecified place in nursing home as the place of occurrence of the external cause: Secondary | ICD-10-CM | POA: Diagnosis not present

## 2022-03-27 DIAGNOSIS — N390 Urinary tract infection, site not specified: Secondary | ICD-10-CM | POA: Diagnosis not present

## 2022-03-27 DIAGNOSIS — I120 Hypertensive chronic kidney disease with stage 5 chronic kidney disease or end stage renal disease: Secondary | ICD-10-CM | POA: Diagnosis not present

## 2022-03-27 NOTE — ED Triage Notes (Signed)
Pt to triage via recliner with no distress noted; very HOH; brought in by EMS from [facility]; reports tripped & fell while ambulating with walker; pt c/o left elbow pain with swelling noted; pt denies any other c/o or injuries ?

## 2022-03-28 ENCOUNTER — Encounter: Payer: Self-pay | Admitting: Internal Medicine

## 2022-03-28 ENCOUNTER — Other Ambulatory Visit: Payer: Self-pay

## 2022-03-28 ENCOUNTER — Emergency Department
Admission: EM | Admit: 2022-03-28 | Discharge: 2022-03-29 | Disposition: A | Discharge status: Other Acute Inpatient Hospital | Payer: Medicare Other | Attending: Emergency Medicine | Admitting: Emergency Medicine

## 2022-03-28 ENCOUNTER — Emergency Department: Payer: Medicare Other

## 2022-03-28 ENCOUNTER — Inpatient Hospital Stay: Payer: Medicare Other

## 2022-03-28 ENCOUNTER — Emergency Department
Admission: EM | Disposition: A | Discharge status: Other Acute Inpatient Hospital | Payer: Self-pay | Source: Home / Self Care | Attending: Emergency Medicine

## 2022-03-28 DIAGNOSIS — S42402A Unspecified fracture of lower end of left humerus, initial encounter for closed fracture: Secondary | ICD-10-CM | POA: Diagnosis not present

## 2022-03-28 DIAGNOSIS — S42412A Displaced simple supracondylar fracture without intercondylar fracture of left humerus, initial encounter for closed fracture: Secondary | ICD-10-CM | POA: Diagnosis not present

## 2022-03-28 DIAGNOSIS — N390 Urinary tract infection, site not specified: Principal | ICD-10-CM

## 2022-03-28 DIAGNOSIS — R4182 Altered mental status, unspecified: Secondary | ICD-10-CM

## 2022-03-28 DIAGNOSIS — M79604 Pain in right leg: Secondary | ICD-10-CM

## 2022-03-28 HISTORY — PX: DIALYSIS/PERMA CATHETER INSERTION: CATH118288

## 2022-03-28 LAB — URINALYSIS, ROUTINE W REFLEX MICROSCOPIC
Bilirubin Urine: NEGATIVE
Glucose, UA: NEGATIVE mg/dL
Ketones, ur: NEGATIVE mg/dL
Nitrite: NEGATIVE
Protein, ur: 100 mg/dL — AB
Specific Gravity, Urine: 1.008 (ref 1.005–1.030)
Squamous Epithelial / HPF: NONE SEEN (ref 0–5)
WBC, UA: >50 WBC/hpf — ABNORMAL HIGH (ref 0–5)
pH: 7 (ref 5.0–8.0)

## 2022-03-28 LAB — HEPATITIS B SURFACE ANTIGEN: Hepatitis B Surface Ag: NONREACTIVE

## 2022-03-28 LAB — BASIC METABOLIC PANEL
Anion gap: 14 (ref 5–15)
BUN: 54 mg/dL — ABNORMAL HIGH (ref 8–23)
CO2: 27 mmol/L (ref 22–32)
Calcium: 9.3 mg/dL (ref 8.9–10.3)
Chloride: 100 mmol/L (ref 98–111)
Creatinine, Ser: 6.46 mg/dL — ABNORMAL HIGH (ref 0.44–1.00)
GFR, Estimated: 6 mL/min — ABNORMAL LOW (ref >60)
Glucose, Bld: 141 mg/dL — ABNORMAL HIGH (ref 70–99)
Potassium: 5 mmol/L (ref 3.5–5.1)
Sodium: 141 mmol/L (ref 135–145)

## 2022-03-28 LAB — LACTIC ACID, PLASMA: Lactic Acid, Venous: 1.4 mmol/L (ref 0.5–1.9)

## 2022-03-28 LAB — CBC
HCT: 34.4 % — ABNORMAL LOW (ref 36.0–46.0)
Hemoglobin: 10.8 g/dL — ABNORMAL LOW (ref 12.0–15.0)
MCH: 29.9 pg (ref 26.0–34.0)
MCHC: 31.4 g/dL (ref 30.0–36.0)
MCV: 95.3 fL (ref 80.0–100.0)
Platelets: 242 10*3/uL (ref 150–400)
RBC: 3.61 MIL/uL — ABNORMAL LOW (ref 3.87–5.11)
RDW: 14.8 % (ref 11.5–15.5)
WBC: 12.3 10*3/uL — ABNORMAL HIGH (ref 4.0–10.5)
nRBC: 0 % (ref 0.0–0.2)

## 2022-03-28 LAB — RESP PANEL BY RT-PCR (FLU A&B, COVID) ARPGX2
Influenza A by PCR: NEGATIVE
Influenza B by PCR: NEGATIVE
SARS Coronavirus 2 by RT PCR: NEGATIVE

## 2022-03-28 LAB — HEPATITIS B SURFACE ANTIBODY,QUALITATIVE: Hep B S Ab: REACTIVE — AB

## 2022-03-28 LAB — PROCALCITONIN: Procalcitonin: <0.1 ng/mL

## 2022-03-28 SURGERY — DIALYSIS/PERMA CATHETER INSERTION
Anesthesia: Moderate Sedation

## 2022-03-28 MED ORDER — HYDROMORPHONE HCL 1 MG/ML IJ SOLN
0.5000 mg | Freq: Once | INTRAMUSCULAR | Status: AC
  Administered 2022-03-28: 0.5 mg via INTRAVENOUS
  Filled 2022-03-28: qty 0.5

## 2022-03-28 MED ORDER — DOCUSATE SODIUM 100 MG PO CAPS: 100.0000 mg | ORAL_CAPSULE | Freq: Two times a day (BID) | ORAL | Status: DC

## 2022-03-28 MED ORDER — ONDANSETRON HCL 4 MG/2ML IJ SOLN
4.0000 mg | Freq: Once | INTRAMUSCULAR | Status: AC
  Administered 2022-03-28: 4 mg via INTRAVENOUS
  Filled 2022-03-28: qty 2

## 2022-03-28 MED ORDER — DIPHENHYDRAMINE HCL 50 MG/ML IJ SOLN: 50.0000 mg | Freq: Once | INTRAMUSCULAR | Status: DC | PRN

## 2022-03-28 MED ORDER — CHLORHEXIDINE GLUCONATE CLOTH 2 % EX PADS
6.0000 | MEDICATED_PAD | Freq: Every day | CUTANEOUS | Status: DC
  Filled 2022-03-28: qty 6

## 2022-03-28 MED ORDER — SODIUM CHLORIDE 0.9 % IV SOLN: 100.0000 mL | INTRAVENOUS | Status: DC | PRN

## 2022-03-28 MED ORDER — FAMOTIDINE 20 MG PO TABS
40.0000 mg | ORAL_TABLET | Freq: Once | ORAL | Status: DC | PRN
Start: 2022-03-28 — End: 2022-03-28

## 2022-03-28 MED ORDER — LIDOCAINE-PRILOCAINE 2.5-2.5 % EX CREA
1.0000 "application " | TOPICAL_CREAM | CUTANEOUS | Status: DC | PRN
  Filled 2022-03-28: qty 5

## 2022-03-28 MED ORDER — FENTANYL CITRATE (PF) 100 MCG/2ML IJ SOLN: 50.0000 ug | Freq: Four times a day (QID) | INTRAMUSCULAR | Status: DC | PRN

## 2022-03-28 MED ORDER — OXYCODONE-ACETAMINOPHEN 5-325 MG PO TABS
1.0000 | ORAL_TABLET | Freq: Four times a day (QID) | ORAL | Status: DC | PRN
  Filled 2022-03-28: qty 1

## 2022-03-28 MED ORDER — EPOETIN ALFA 10000 UNIT/ML IJ SOLN: 4000.0000 [IU] | INTRAMUSCULAR | Status: DC

## 2022-03-28 MED ORDER — DULOXETINE HCL 60 MG PO CPEP: 60.0000 mg | ORAL_CAPSULE | Freq: Every day | ORAL | Status: DC

## 2022-03-28 MED ORDER — HYDROXYZINE HCL 10 MG PO TABS
10.0000 mg | ORAL_TABLET | Freq: Three times a day (TID) | ORAL | Status: DC | PRN
  Filled 2022-03-28: qty 1

## 2022-03-28 MED ORDER — HEPARIN SODIUM (PORCINE) 1000 UNIT/ML IJ SOLN
INTRAMUSCULAR | Status: AC
  Administered 2022-03-28: 3200 [IU] via INTRAVENOUS_CENTRAL
  Filled 2022-03-28: qty 10

## 2022-03-28 MED ORDER — MIDAZOLAM HCL 2 MG/2ML IJ SOLN
INTRAMUSCULAR | Status: DC | PRN
  Administered 2022-03-28 (×2): 1 mg via INTRAVENOUS

## 2022-03-28 MED ORDER — CEFAZOLIN SODIUM-DEXTROSE 1-4 GM/50ML-% IV SOLN: 1.0000 g | INTRAVENOUS | Status: AC

## 2022-03-28 MED ORDER — METHYLPREDNISOLONE SODIUM SUCC 125 MG IJ SOLR: 125.0000 mg | Freq: Once | INTRAMUSCULAR | Status: DC | PRN

## 2022-03-28 MED ORDER — HYDROMORPHONE HCL 1 MG/ML IJ SOLN
1.0000 mg | Freq: Once | INTRAMUSCULAR | Status: AC | PRN
  Administered 2022-03-28: 1 mg via INTRAVENOUS
  Filled 2022-03-28: qty 1

## 2022-03-28 MED ORDER — ONDANSETRON HCL 4 MG/2ML IJ SOLN
4.0000 mg | Freq: Four times a day (QID) | INTRAMUSCULAR | Status: DC | PRN
Start: 2022-03-28 — End: 2022-03-29
  Administered 2022-03-28: 4 mg via INTRAVENOUS
  Filled 2022-03-28: qty 2

## 2022-03-28 MED ORDER — MIDAZOLAM HCL 2 MG/2ML IJ SOLN
INTRAMUSCULAR | Status: AC
  Filled 2022-03-28: qty 2

## 2022-03-28 MED ORDER — LIDOCAINE HCL (PF) 1 % IJ SOLN
5.0000 mL | INTRAMUSCULAR | Status: DC | PRN
  Filled 2022-03-28: qty 5

## 2022-03-28 MED ORDER — IXAZOMIB CITRATE 3 MG PO CAPS: 3.0000 mg | ORAL_CAPSULE | ORAL | Status: DC

## 2022-03-28 MED ORDER — MIDAZOLAM HCL 2 MG/ML PO SYRP: 8.0000 mg | ORAL_SOLUTION | Freq: Once | ORAL | Status: DC | PRN

## 2022-03-28 MED ORDER — SODIUM CHLORIDE 0.9 % IV SOLN
1.0000 g | INTRAVENOUS | Status: DC
  Filled 2022-03-28: qty 10

## 2022-03-28 MED ORDER — FENTANYL CITRATE PF 50 MCG/ML IJ SOSY
PREFILLED_SYRINGE | INTRAMUSCULAR | Status: AC
  Filled 2022-03-28: qty 1

## 2022-03-28 MED ORDER — SODIUM CHLORIDE 0.9 % IV SOLN: INTRAVENOUS | Status: DC

## 2022-03-28 MED ORDER — CEFTRIAXONE SODIUM 1 G IJ SOLR
1.0000 g | Freq: Once | INTRAMUSCULAR | Status: AC
  Administered 2022-03-28: 1 g via INTRAVENOUS
  Filled 2022-03-28: qty 10

## 2022-03-28 MED ORDER — HEPARIN SODIUM (PORCINE) 1000 UNIT/ML DIALYSIS: 1000.0000 [IU] | INTRAMUSCULAR | Status: DC | PRN

## 2022-03-28 MED ORDER — AMLODIPINE BESYLATE 5 MG PO TABS: 2.5000 mg | ORAL_TABLET | Freq: Every day | ORAL | Status: DC

## 2022-03-28 MED ORDER — ALTEPLASE 2 MG IJ SOLR: 2.0000 mg | Freq: Once | INTRAMUSCULAR | Status: DC | PRN

## 2022-03-28 MED ORDER — LEVOTHYROXINE SODIUM 150 MCG PO TABS
150.0000 ug | ORAL_TABLET | Freq: Every day | ORAL | Status: DC
  Filled 2022-03-28: qty 1

## 2022-03-28 MED ORDER — POLYETHYLENE GLYCOL 3350 17 G PO PACK
17.0000 g | PACK | Freq: Every day | ORAL | Status: DC | PRN
  Filled 2022-03-28: qty 1

## 2022-03-28 MED ORDER — CEFAZOLIN SODIUM-DEXTROSE 1-4 GM/50ML-% IV SOLN
INTRAVENOUS | Status: AC
  Administered 2022-03-28: 1 g via INTRAVENOUS
  Filled 2022-03-28: qty 50

## 2022-03-28 MED ORDER — PENTAFLUOROPROP-TETRAFLUOROETH EX AERO: 1.0000 "application " | INHALATION_SPRAY | CUTANEOUS | Status: DC | PRN

## 2022-03-28 MED ORDER — DEXAMETHASONE 4 MG PO TABS: 8.0000 mg | ORAL_TABLET | ORAL | Status: DC

## 2022-03-28 MED ORDER — ATORVASTATIN CALCIUM 20 MG PO TABS
40.0000 mg | ORAL_TABLET | Freq: Every day | ORAL | Status: DC
Start: 2022-03-28 — End: 2022-03-29

## 2022-03-28 MED ORDER — ACYCLOVIR 200 MG PO CAPS: 200.0000 mg | ORAL_CAPSULE | Freq: Every day | ORAL | Status: DC

## 2022-03-28 MED ORDER — ONDANSETRON HCL 4 MG PO TABS: 8.0000 mg | ORAL_TABLET | Freq: Three times a day (TID) | ORAL | Status: DC | PRN

## 2022-03-28 MED ORDER — FENTANYL CITRATE PF 50 MCG/ML IJ SOSY
50.0000 ug | PREFILLED_SYRINGE | Freq: Once | INTRAMUSCULAR | Status: AC
  Administered 2022-03-28: 50 ug via INTRAVENOUS
  Filled 2022-03-28: qty 1

## 2022-03-28 MED ORDER — HYDROMORPHONE HCL 1 MG/ML IJ SOLN
0.5000 mg | Freq: Once | INTRAMUSCULAR | Status: DC
  Filled 2022-03-28: qty 0.5

## 2022-03-28 MED ORDER — FENTANYL CITRATE (PF) 100 MCG/2ML IJ SOLN
INTRAMUSCULAR | Status: DC | PRN
  Administered 2022-03-28 (×3): 25 ug via INTRAVENOUS

## 2022-03-28 SURGICAL SUPPLY — 13 items
ADH SKN CLS APL DERMABOND .7 (GAUZE/BANDAGES/DRESSINGS) ×1
BIOPATCH RED 1 DISK 7.0 (GAUZE/BANDAGES/DRESSINGS) ×1 IMPLANT
CANNULA 5F STIFF (CANNULA) ×1 IMPLANT
CATH PALINDROME-P 19CM W/VT (CATHETERS) ×1 IMPLANT
COVER PROBE U/S 5X48 (MISCELLANEOUS) ×2 IMPLANT
DERMABOND ADVANCED (GAUZE/BANDAGES/DRESSINGS) ×1
DERMABOND ADVANCED .7 DNX12 (GAUZE/BANDAGES/DRESSINGS) IMPLANT
GAUZE SPONGE 4X4 12PLY STRL (GAUZE/BANDAGES/DRESSINGS) ×2 IMPLANT
GOWN SRG XL LVL 3 NONREINFORCE (GOWNS) IMPLANT
GOWN STRL NON-REIN TWL XL LVL3 (GOWNS) ×2
PACK ANGIOGRAPHY (CUSTOM PROCEDURE TRAY) ×1 IMPLANT
SUT MNCRL AB 4-0 PS2 18 (SUTURE) ×1 IMPLANT
SUT SILK 0 FSL (SUTURE) ×1 IMPLANT

## 2022-03-28 NOTE — Progress Notes (Signed)
[facility] ?[place], [place] ?03/28/22 ? ?Subjective:  ? ?LOS: 0 ? ?Patient known to our practice from outpatient dialysis.  She currently dialyzes on Tuesday/Saturday at [facility] dialysis at [facility] [place].  She was brought in today by family because she had a fall at [facility].  Patient uses a walker at baseline.  Patient injured her left elbow which is also her site of AV fistula.  X-ray of the elbow shows displaced supracondylar fracture with probable impaction and medial displacement of distal fracture fragment.  Patient is scheduled to be transferred to [facility] for further evaluation.  Nephrology consult has been requested to arrange for hemodialysis.  Her last dialysis was on 03/25/2022. ?  ? ?Objective:  ?Vital signs in last 24 hours:  ?Temp:  [97.7 ?F (36.5 ?C)-99.5 ?F (37.5 ?C)] 98.8 ?F (37.1 ?C) (04/18 0725) ?Pulse Rate:  [86-139] 86 (04/18 1700) ?Resp:  [11-24] 15 (04/18 1700) ?BP: (131-163)/(52-115) 132/52 (04/18 1700) ?SpO2:  [96 %-100 %] 99 % (04/18 1700) ?Weight:  [63 kg] 63 kg (04/18 1331) ? ?Weight change:  ?Filed Weights  ? 03/27/22 2325 03/28/22 1331  ?Weight: 63 kg 63 kg  ? ? ?Intake/Output: ?  ? ?Intake/Output Summary (Last 24 hours) at 03/28/2022 1716 ?Last data filed at 03/28/2022 1500 ?Gross per 24 hour  ?Intake 100 ml  ?Output 150 ml  ?Net -50 ml  ? ? ? ?Physical Exam: ?General: Laying in the bed, no acute distress  ?HEENT Anicteric, very hard of hearing  ?Pulm/lungs Normal breathing effort   ?CVS/Heart No rub  ?Abdomen:  Soft, nontender  ?Extremities: Left upper arm is bandaged  ?Neurologic: Alert, able to answer questions  ?Skin: Warm, dry  ?Access: Left upper extremity AV fistula  ?   ? ? ?Basic Metabolic Panel: ? ?Recent Labs  ?Lab 03/28/22 ?0631  ?NA 141  ?K 5.0  ?CL 100  ?CO2 27  ?GLUCOSE 141*  ?BUN 54*  ?CREATININE 6.46*  ?CALCIUM 9.3  ? ? ? ?CBC: ?Recent Labs  ?Lab 03/28/22 ?0631  ?WBC 12.3*  ?HGB 10.8*  ?HCT 34.4*  ?MCV 95.3  ?PLT 242  ? ? ? No  results found for: HEPBSAG, HEPBSAB, HEPBIGM ? ? ? ?Microbiology: ? ?Recent Results (from the past 240 hour(s))  ?Resp Panel by RT-PCR (Flu A&B, Covid) Nasopharyngeal Swab     Status: None  ? Collection Time: 03/28/22  6:31 AM  ? Specimen: Nasopharyngeal Swab; Nasopharyngeal(NP) swabs in vial transport medium  ?Result Value Ref Range Status  ? SARS Coronavirus 2 by RT PCR NEGATIVE NEGATIVE Final  ?  Comment: (NOTE) ?SARS-CoV-2 target nucleic acids are NOT DETECTED. ? ?The SARS-CoV-2 RNA is generally detectable in upper respiratory ?specimens during the acute phase of infection. The lowest ?concentration of SARS-CoV-2 viral copies this assay can detect is ?138 copies/mL. A negative result does not preclude SARS-Cov-2 ?infection and should not be used as the sole basis for treatment or ?other patient management decisions. A negative result may occur with  ?improper specimen collection/handling, submission of specimen other ?than nasopharyngeal swab, presence of viral mutation(s) within the ?areas targeted by this assay, and inadequate number of viral ?copies(<138 copies/mL). A negative result must be combined with ?clinical observations, patient history, and epidemiological ?information. The expected result is Negative. ? ?Fact Sheet for Patients:  ?EntrepreneurPulse.com.au ? ?Fact Sheet for Healthcare Providers:  ?IncredibleEmployment.be ? ?This test is no t yet approved or cleared by the [place] and  ?has been authorized for detection  and/or diagnosis of SARS-CoV-2 by ?FDA under an Emergency Use Authorization (EUA). This EUA will remain  ?in effect (meaning this test can be used) for the duration of the ?COVID-19 declaration under Section 564(b)(1) of the Act, 21 ?U.S.C.section 360bbb-3(b)(1), unless the authorization is terminated  ?or revoked sooner.  ? ? ?  ? Influenza A by PCR NEGATIVE NEGATIVE Final  ? Influenza B by PCR NEGATIVE NEGATIVE Final  ?  Comment:  (NOTE) ?The Xpert Xpress SARS-CoV-2/FLU/RSV plus assay is intended as an aid ?in the diagnosis of influenza from Nasopharyngeal swab specimens and ?should not be used as a sole basis for treatment. Nasal washings and ?aspirates are unacceptable for Xpert Xpress SARS-CoV-2/FLU/RSV ?testing. ? ?Fact Sheet for Patients: ?EntrepreneurPulse.com.au ? ?Fact Sheet for Healthcare Providers: ?IncredibleEmployment.be ? ?This test is not yet approved or cleared by the [place] FDA and ?has been authorized for detection and/or diagnosis of SARS-CoV-2 by ?FDA under an Emergency Use Authorization (EUA). This EUA will remain ?in effect (meaning this test can be used) for the duration of the ?COVID-19 declaration under Section 564(b)(1) of the Act, 21 U.S.C. ?section 360bbb-3(b)(1), unless the authorization is terminated or ?revoked. ? ?Performed at [facility], [place], ?[place] [address] ?  ? ? ?Coagulation Studies: ?No results for input(s): LABPROT, INR in the last 72 hours. ? ?Urinalysis: ?Recent Labs  ?  03/28/22 ?0115  ?COLORURINE YELLOW*  ?LABSPEC 1.008  ?PHURINE 7.0  ?GLUCOSEU NEGATIVE  ?HGBUR SMALL*  ?BILIRUBINUR NEGATIVE  ?KETONESUR NEGATIVE  ?PROTEINUR 100*  ?NITRITE NEGATIVE  ?LEUKOCYTESUR LARGE*  ?  ? ? ?Imaging: ?DG Elbow Complete Left ? ?Result Date: 03/28/2022 ?CLINICAL DATA:  Fall. EXAM: LEFT ELBOW - COMPLETE 3+ VIEW COMPARISON:  None. FINDINGS: There is a displaced supracondylar fracture with probable impaction. There is medial displacement of the distal fracture fragment. No dislocation. The bones are osteopenic. There is joint effusion. The soft tissue swelling of the elbow. IMPRESSION: Displaced supracondylar fracture. Electronically Signed   By: Anner Crete M.D.   On: 03/28/2022 00:42  ? ?CT HEAD WO CONTRAST (5MM) ? ?Result Date: 03/28/2022 ?CLINICAL DATA:  Trauma. EXAM: CT HEAD WITHOUT CONTRAST CT CERVICAL SPINE WITHOUT CONTRAST TECHNIQUE:  Multidetector CT imaging of the head and cervical spine was performed following the standard protocol without intravenous contrast. Multiplanar CT image reconstructions of the cervical spine were also generated. RADIATION DOSE REDUCTION: This exam was performed according to the departmental dose-optimization program which includes automated exposure control, adjustment of the mA and/or kV according to patient size and/or use of iterative reconstruction technique. COMPARISON:  Head CT dated 02/26/2019. FINDINGS: CT HEAD FINDINGS Brain: Mild age-related atrophy and chronic microvascular ischemic changes. There is no acute intracranial hemorrhage. No mass effect or midline shift. No extra-axial fluid collection. Vascular: No hyperdense vessel or unexpected calcification. Skull: Normal. Negative for fracture or focal lesion. Sinuses/Orbits: No acute finding. Other: None CT CERVICAL SPINE FINDINGS Alignment: No acute subluxation. There is straightening of normal cervical lordosis which may be positional or due to muscle spasm. Grade 1 C4-C5 anterolisthesis. Skull base and vertebrae: No acute fracture. Soft tissues and spinal canal: No prevertebral fluid or swelling. No visible canal hematoma. Disc levels:  Multilevel degenerative changes. Upper chest: Negative. Other: Bilateral carotid bulb calcified plaques. IMPRESSION: 1. No acute intracranial pathology. Mild age-related atrophy and chronic microvascular ischemic changes. 2. No acute cervical spine fracture or subluxation. Multilevel degenerative changes. Electronically Signed   By: Anner Crete M.D.   On: 03/28/2022  01:03  ? ?CT Cervical Spine Wo Contrast ? ?Result Date: 03/28/2022 ?CLINICAL DATA:  Trauma. EXAM: CT HEAD WITHOUT CONTRAST CT CERVICAL SPINE WITHOUT CONTRAST TECHNIQUE: Multidetector CT imaging of the head and cervical spine was performed following the standard protocol without intravenous contrast. Multiplanar CT image reconstructions of the cervical  spine were also generated. RADIATION DOSE REDUCTION: This exam was performed according to the departmental dose-optimization program which includes automated exposure control, adjustment of the mA and/or kV acc

## 2022-03-28 NOTE — Progress Notes (Signed)
I spoke to Christina Mercado in bed placement, Christina Mercado will be transferred  from [facility] to [facility], she does not have a  bed assignment at this time. ?

## 2022-03-28 NOTE — Op Note (Signed)
OPERATIVE NOTE ? ? ? ?PRE-OPERATIVE DIAGNOSIS: 1. ESRD ?2. LEFT Brachiocephalic fistula with LEFT elbow fracture-inability to access LEFT AV fistula ? ?POST-OPERATIVE DIAGNOSIS: same as above ? ?PROCEDURE: ?Ultrasound guidance for vascular access to the right internal jugular vein ?Fluoroscopic guidance for placement of catheter ?Placement of a 19 cm tip to cuff tunneled hemodialysis catheter via the right internal jugular vein ? ?SURGEON: Jamesetta So, MD ? ?ANESTHESIA:  Local with Moderate conscious sedation for approximately 54 minutes using 2 mg of Versed and 75 mcg of Fentanyl ? ?ESTIMATED BLOOD LOSS: 5 cc ? ?FLUORO TIME: less than one minute ? ?CONTRAST: none ? ?FINDING(S): ?1.  Patent right internal jugular vein ? ?SPECIMEN(S):  None ? ?INDICATIONS:   ?Christina Mercado is a 86 y.o.female who presents with ESRD.  The patient needs long term dialysis access for their ESRD, and a Permcath is necessary.  The patient has a LEFT Brachiocephalic AV fistula. She sustained a LEFT elbow fracture. Unable to access fistula. Risks and benefits are discussed and informed consent is obtained.   ? ?DESCRIPTION: ?After obtaining full informed written consent, the patient was brought back to the vascular suited. The patient's right neck and chest were sterilely prepped and draped in a sterile surgical field was created. Moderate conscious sedation was administered during a face to face encounter with the patient throughout the procedure with my supervision of the RN administering medicines and monitoring the patient's vital signs, pulse oximetry, telemetry and mental status throughout from the start of the procedure until the patient was taken to the recovery room.  The right internal jugular vein was visualized with ultrasound and found to be patent. It was then accessed under direct ultrasound guidance and a permanent image was recorded. A wire was placed. After skin nick and dilatation, the peel-away sheath was placed over  the wire. ?I then turned my attention to an area under the clavicle. Approximately 1-2 fingerbreadths below the clavicle a small counterincision was created and tunneled from the subclavicular incision to the access site. Using fluoroscopic guidance, a 19 centimeter tip to cuff tunneled hemodialysis catheter was selected, and tunneled from the subclavicular incision to the access site. It was then placed through the peel-away sheath and the peel-away sheath was removed. Using fluoroscopic guidance the catheter tips were parked in the right atrium. The appropriate distal connectors were placed. It withdrew blood well and flushed easily with heparinized saline and a concentrated heparin solution was then placed. It was secured to the chest wall with 2 Prolene sutures. The access incision was closed single 4-0 Monocryl. A 4-0 Monocryl pursestring suture was placed around the exit site. Sterile dressings were placed. ?The patient tolerated the procedure well and was taken to the recovery room in stable condition. ? ?COMPLICATIONS: None ? ?CONDITION: Stable ? ?Evaristo Bury, MD ?03/28/2022 ?2:45 PM ? ? ?This note was created with Dragon Medical transcription system. Any errors in dictation are purely unintentional.  ?

## 2022-03-28 NOTE — ED Notes (Signed)
Per secretary, no bed yet at [facility], likely will get bed tomorrow.  ?

## 2022-03-28 NOTE — ED Notes (Signed)
Pt return from dialysis. A/ox4, mild distress. +pmsc to LUE ?

## 2022-03-28 NOTE — ED Notes (Signed)
Report received from Matt,RN

## 2022-03-28 NOTE — ED Notes (Signed)
Family updated on status and transfer. Family verbalizes understanding, all questions answered. Per pt oncologist, pt chemo med will be held until AFTER surgery, no need to have it administered PO by family.  ?

## 2022-03-28 NOTE — ED Provider Notes (Signed)
?  Clinical Course as of 03/28/22 1233  ?Tue Mar 28, 2022  ?0705 Patient received in signout from Dr. Leonides Schanz pending return call from [facility] about transfer. Unwitnessed fall causing left supracondylar fx on arm with ipsilateral AVF. Possible metabolic encephalopathy from UTI [DS]  ?0745 I introduce myself to the patient and reassess. Controlled pain. Left arm is splinted. Awaiting callback [DS]  ?[place] Dr. Candiss Norse [DS]  ?0854 I consult with hospitalist at [facility], Dr. Candiss Norse. He is happy to accept the patient in transfer, pending confirmation from orthopedic team that fixation is possible from their perspective. Ortho PA added on to a secure chat, pending confirmation [DS]  ?0948 Ortho can take to the OR tomorrow. Accepted by hospitalist. ?I reach out to our nephrologists here to discuss hemodialysis [DS]  ?1008 I consult with Dr. Candiss Norse who will see patient for HD today [DS]  ?1200 I consult with Dr. Jamse Arn, hospitalist, for consultation of chronic medical management.  [DS]  ?1220 I consult with Dr. Lorenso Courier for vascular access for iHD today [DS]  ?  ?Clinical Course User Index ?[DS] Vladimir Crofts, MD  ? ?.1-3 Lead EKG Interpretation ?Performed by: Vladimir Crofts, MD ?Authorized by: Vladimir Crofts, MD  ? ?  Interpretation: abnormal   ?  ECG rate:  102 ?  ECG rate assessment: tachycardic   ?  Rhythm: sinus tachycardia   ?  Ectopy: none   ?  Conduction: normal   ?.Critical Care ?Performed by: Vladimir Crofts, MD ?Authorized by: Vladimir Crofts, MD  ? ?Critical care provider statement:  ?  Critical care time (minutes):  30 ?  Critical care time was exclusive of:  Separately billable procedures and treating other patients ?  Critical care was necessary to treat or prevent imminent or life-threatening deterioration of the following conditions:  Sepsis ?  Critical care was time spent personally by me on the following activities:  Development of treatment plan with patient or surrogate, discussions with consultants, evaluation of  patient's response to treatment, examination of patient, ordering and review of laboratory studies, ordering and review of radiographic studies, ordering and performing treatments and interventions, pulse oximetry, re-evaluation of patient's condition and review of old charts ? ? ? ?  ?Vladimir Crofts, MD ?03/28/22 1233 ? ?

## 2022-03-28 NOTE — ED Notes (Signed)
Called [facility] to update patient back from dialysis in room 9  ?Per Maudie Mercury still no bed assignment at this time  ?

## 2022-03-28 NOTE — ED Notes (Signed)
[facility] and [facility] are at capacity and [facility] called for transfer at [phone] ?

## 2022-03-28 NOTE — Plan of Care (Addendum)
? ? ?  Christina Mercado, is a 86 y.o. female, DOB - Feb 05, 1936, MRN:[id number] ? ? ?86 year old dialysis patient who presented to [facility] after a fall, found to have left elbow fracture.  Orthopedics at [facility] referred the patient here, accepted by orthopedic surgery Dr. Doreatha Martin in [place].  She is due for dialysis on 03/28/2022, no beds are available yet at [facility].  Have requested [place] [facility] physician to involve the nephrology team and make sure that dialysis is done per schedule.  She will come to a medical telemetry bed once bed is available. ? ? ?Signature ? ?Lala Lund M.D on 03/28/2022 at 9:52 AM   -  To page go to [email or web address]  ? ?  ? ? ?

## 2022-03-28 NOTE — ED Notes (Signed)
MD Esco at bedside for vascular consult ? ?

## 2022-03-28 NOTE — ED Provider Notes (Signed)
? ?[facility] ?Provider Note ? ? ? Event Date/Time  ? First MD Initiated Contact with Patient 03/28/22 0531   ?  (approximate) ? ? ?History  ? ?Fall ? ? ?HPI ? ?Christina Mercado is a 86 y.o. 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  Fall was unwitnessed.  Patient initially tells me that she does not know why she fell and then tells me that she tripped.  States she does not think she was using her walker.  She injured her left elbow.  She did hit her head.  She is not on blood thinners.  Her son who is at the bedside states that they have noticed over the past several days that she has been intermittently confused and his wife who is a nurse thought that she could have another UTI.  No missed dialysis.  No fevers, cough.  She denies chest pain, shortness of breath, vomiting or diarrhea.  Denies neck or back pain. ? ? ?History provided by patient and son. ? ? ? ?Past Medical History:  ?Diagnosis Date  ? Arthritis   ? Diabetes mellitus without complication ([place])   ? Hypertension   ? Kidney failure   ? stage 3  ? Proteinuria   ? ? ?Past Surgical History:  ?Procedure Laterality Date  ? DIALYSIS/PERMA CATHETER INSERTION N/A 05/30/2021  ? Procedure: DIALYSIS/PERMA CATHETER INSERTION;  Surgeon: Algernon Huxley, MD;  Location: [place] [facility];  Service: Cardiovascular;  Laterality: N/A;  ? DIALYSIS/PERMA CATHETER REMOVAL N/A 08/02/2021  ? Procedure: DIALYSIS/PERMA CATHETER REMOVAL;  Surgeon: Katha Cabal, MD;  Location: [place] CV [facility];  Service: Cardiovascular;  Laterality: N/A;  ? EYE SURGERY    ? RENAL BIOPSY    ? TOTAL HIP ARTHROPLASTY Right   ? ? ?MEDICATIONS:  ?Prior to Admission  medications   ?Medication Sig Start Date End Date Taking? Authorizing Provider  ?acetaminophen (TYLENOL) 500 MG tablet Take 1,000 mg by mouth every 8 (eight) hours as needed for moderate pain.    [provider]  ?acyclovir (ZOVIRAX) 200 MG capsule Take 200 mg by mouth daily.    [provider]  ?amLODipine (NORVASC) 2.5 MG tablet Take 2.5 mg by mouth daily.    [provider]  ?Ascorbic Acid (VITAMIN C) 1000 MG tablet Take 1,000 mg by mouth daily.    [provider]  ?atorvastatin (LIPITOR) 40 MG tablet Take 40 mg by mouth daily at 6 PM.    [provider]  ?calcium gluconate 500 MG tablet Take 1 tablet by mouth daily.    [provider]  ?Cholecalciferol (VITAMIN D3) 1000 units CAPS Take 2,000 Units by mouth every morning.    [provider]  ?dexamethasone (DECADRON) 4 MG tablet Take 8 mg by mouth See admin instructions. Once a week, before Ninlaro treatment.    [provider]  ?docusate sodium (COLACE) 100 MG capsule Take 1 capsule (100 mg total) by mouth 2 (two) times daily. ?Patient taking differently: Take 100 mg by mouth 2 (two) times daily as needed for moderate constipation. 07/04/16   Theodoro Grist, MD  ?DULoxetine (CYMBALTA) 60 MG capsule Take 60 mg by mouth daily.    [provider]  ?  ferrous sulfate 325 (65 FE) MG tablet Take 325 mg by mouth daily with breakfast.    [provider]  ?hydrOXYzine (ATARAX/VISTARIL) 10 MG tablet Take 10 mg by mouth 3 (three) times daily as needed for itching.    [provider]  ?levothyroxine (SYNTHROID) 150 MCG tablet Take 150 mcg by mouth daily before breakfast. 06/17/16   [provider]  ?lidocaine-prilocaine (EMLA) cream Apply 1 application topically as needed (dialysis access).    [provider]  ?NINLARO 3 MG capsule Take 3 mg by mouth once a week. Once a week for 3 weeks in a 28 day cycle 07/25/21   [provider]  ?ondansetron (ZOFRAN) 8  MG tablet Take 8 mg by mouth every 8 (eight) hours as needed for nausea or vomiting.    [provider]  ?Oxycodone HCl 10 MG TABS Take 10 mg by mouth 5 (five) times daily. 06/21/16   [provider]  ?polyethylene glycol (MIRALAX / GLYCOLAX) packet Take 17 g by mouth daily as needed for moderate constipation.    [provider]  ?vitamin B-12 (CYANOCOBALAMIN) 1000 MCG tablet Take 1,000 mcg by mouth every morning.    [provider]  ? ? ?Physical Exam  ? ?Triage Vital Signs: ?ED Triage Vitals  ?Enc Vitals Group  ?   BP 03/27/22 2324 (!) 143/60  ?   Pulse Rate 03/27/22 2324 (!) 102  ?   Resp 03/27/22 2324 18  ?   Temp 03/27/22 2324 97.7 ?F (36.5 ?C)  ?   Temp Source 03/27/22 2324 Oral  ?   SpO2 03/27/22 2324 97 %  ?   Weight 03/27/22 2325 139 lb (63 kg)  ?   Height 03/27/22 2325 '5\' 5"'$  (1.651 m)  ?   Head Circumference --   ?   Peak Flow --   ?   Pain Score --   ?   Pain Loc --   ?   Pain Edu? --   ?   Excl. in [place]? --   ? ? ?Most recent vital signs: ?Vitals:  ? 03/27/22 2324 03/28/22 0606  ?BP: (!) 143/60 138/67  ?Pulse: (!) 102 (!) 113  ?Resp: 18 18  ?Temp: 97.7 ?F (36.5 ?C) 99.5 ?F (37.5 ?C)  ?SpO2: 97% 97%  ? ? ? ?CONSTITUTIONAL: Alert and oriented to person and place but not time.  Elderly.  Hard of hearing. ?HEAD: Normocephalic; atraumatic ?EYES: Conjunctivae clear, PERRL, EOMI ?ENT: normal nose; no rhinorrhea; moist mucous membranes; pharynx without lesions noted; no dental injury; no septal hematoma, no epistaxis; no facial deformity or bony tenderness ?NECK: Supple, no midline spinal tenderness, step-off or deformity; trachea midline ?CARD: RRR; S1 and S2 appreciated; no murmurs, no clicks, no rubs, no gallops ?RESP: Normal chest excursion without splinting or tachypnea; breath sounds clear and equal bilaterally; no wheezes, no rhonchi, no rales; no hypoxia or respiratory distress ?CHEST:  chest wall stable, no crepitus or ecchymosis or deformity, nontender to palpation; no  flail chest ?ABD/GI: Normal bowel sounds; non-distended; soft, non-tender, no rebound, no guarding; no ecchymosis or other lesions noted ?PELVIS:  stable, nontender to palpation ?BACK:  The back appears normal; no midline spinal tenderness, step-off or deformity ?EXT: Soft tissue swelling, joint effusion and ecchymosis noted to the left elbow.  Decreased range of motion in this joint due to pain.  Compartments in the left arm are soft.  2+ left radial pulse and normal capillary refill.  She reports normal sensation throughout  the left upper extremity.  She has dialysis access in the left antecubital fossa with normal thrill and bruit.  Otherwise extremities nontender to palpation.  She does have mild pitting edema in bilateral distal lower extremities. ?SKIN: Normal color for age and race; warm ?NEURO: No facial asymmetry, normal speech, moving all extremities equally ? ?ED Results / Procedures / Treatments  ? ?LABS: ?(all labs ordered are listed, but only abnormal results are displayed) ?Labs Reviewed  ?URINALYSIS, ROUTINE W REFLEX MICROSCOPIC - Abnormal; Notable for the following components:  ?    Result Value  ? Color, Urine YELLOW (*)   ? APPearance TURBID (*)   ? Hgb urine dipstick SMALL (*)   ? Protein, ur 100 (*)   ? Leukocytes,Ua LARGE (*)   ? WBC, UA >50 (*)   ? Bacteria, UA MANY (*)   ? All other components within normal limits  ?URINE CULTURE  ?RESP PANEL BY RT-PCR (FLU A&B, COVID) ARPGX2  ?CBC  ?BASIC METABOLIC PANEL  ? ? ? ?EKG: ? ?  ? Date: 03/28/2022 6:25 AM ? Rate: 105 ? Rhythm: sinus tachycardia ? QRS Axis: normal ? Intervals: normal ? ST/T Wave abnormalities: normal ? Conduction Disutrbances: none ? Narrative Interpretation: sinus tachycardia ? ? ? ? ? ?RADIOLOGY: ?My personal review and interpretation of imaging: CT head and cervical spine show no traumatic injury.  Left elbow x-ray shows a displaced supracondylar fracture. ? ?I have personally reviewed all radiology reports. ?DG Elbow Complete  Left ? ?Result Date: 03/28/2022 ?CLINICAL DATA:  Fall. EXAM: LEFT ELBOW - COMPLETE 3+ VIEW COMPARISON:  None. FINDINGS: There is a displaced supracondylar fracture with probable impaction. There is media

## 2022-03-28 NOTE — ED Notes (Signed)
Eric to [employer] to [employer] ?

## 2022-03-28 NOTE — ED Notes (Signed)
Called [facility] for consult and transfer ?

## 2022-03-28 NOTE — Consult Note (Signed)
Initial Consultation Note ? ? ?Patient: Christina Mercado DJM:[id number] DOB: [birth year] PCP: Myrtie Soman, MD ?DOA: 03/28/2022 ?DOS: the patient was seen and examined on 03/28/2022 ?Primary service: Thurnell Lose, MD ? ?Referring physician: Dr. Su Hoff ?Reason for consult: Medical management prior to transfer to [facility] ? ?Assessment/Plan: ?Assessment and Plan: ?No notes have been filed under this hospital service. ?Service: Hospitalist ? ?UTI ?Continue ceftriaxone as initiated in the ED ?Cultures pending ?I suspect patient's increased instability is likely secondary to UTI ? ?Left elbow fracture ?Pain is reasonably controlled on fentanyl as needed, I will continue ?Elbow has been immobilized, for surgery per orthopedics after transfer to [facility] ? ?ESRD ?Patient to have HD access placed and undergo dialysis today ? ?H/o DVT/VTE ?Patient's had 2 episodes of DVT/VTE in the past, they were apparently unprovoked per report. ?Had previously been on Eliquis in the past, it was discontinued presumably secondary to falls. ?Patient with slight asymmetry of her right versus left legs, will order duplex studies to rule out right DVT although my suspicion is low.  However given history of DVT/VTE and being mostly sedentary, would want to rule this out. ? ?Chemotherapy/MM ?Ninlaro to be administered by patient's son who is an Therapist, sports per schedule.  She is due for her dose today, will be administered after dialysis as per usual.  He will pretreat with steroids as per previous orders per Dr. Tracey Harries. ? ?HTN ?Continue amlodipine ? ?DM 2 ?Diet controlled, continue carb controlled diet ? ?Hypothyroidism ?Continue Synthroid ? ?Dyslipidemia ?Continue atorvastatin ? ?RA ?No evidence for acute flare ? ?Anxiety and depression ?Continue duloxetine ? ? ?TRH will continue to follow the patient. ? ?HPI: Christina Mercado is a 86 y.o. 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  Patient was worked up in the emergency room at which point she was noted to have a negative head and neck CT for any acute findings.  She is also noted to have a new urinary tract infection and was started on ceftriaxone for this. ? ?Patient is due for dialysis tomorrow.  Unfortunately her HD access is just proximal to her fracture at left elbow.  Also of note, orthopedic surgeons are not able to stabilize this particular kind of fracture and it was recommended that patient be transferred to [facility] for further orthopedic evaluation and treatment.  Unfortunately, due to bed situation, patient will not be transferred until tomorrow. ? ?Plan, as per nephrology, hospitalist administration and ED is that patient will stay in the ED with a hospitalist providing medicine consultation.  Nephrology on board and have organized for new access to be placed by vascular surgery.  Patient is agreeable to this plan. ? ?Patient states she has no acute medical needs at present other than pain from her elbow fracture which she states is presently well controlled.  Patient states she does not remember exactly what hospital she is in but she knows she is here because she fell and had an elbow fracture.  Patient denied chest pain or palpitations before during or after the fall.  At present she admits to having a dull headache posterior head which she attributes to having laying on this hard bed for a while.  Patient denies dysuria, back pain, fevers chills or malaise.  Patient denies  right lower extremity pain or swelling as far she is aware. ? ?Review of Systems: As mentioned in the history of present illness. All other systems reviewed and are negative. ?Past Medical History:  ?Diagnosis Date  ? Arthritis   ? Diabetes mellitus without complication ([place])   ? Hypertension   ? Kidney failure   ?  stage 3  ? Proteinuria   ? ?Past Surgical History:  ?Procedure Laterality Date  ? DIALYSIS/PERMA CATHETER INSERTION N/A 05/30/2021  ? Procedure: DIALYSIS/PERMA CATHETER INSERTION;  Surgeon: Algernon Huxley, MD;  Location: [place] [facility];  Service: Cardiovascular;  Laterality: N/A;  ? DIALYSIS/PERMA CATHETER REMOVAL N/A 08/02/2021  ? Procedure: DIALYSIS/PERMA CATHETER REMOVAL;  Surgeon: Katha Cabal, MD;  Location: [place] [facility];  Service: Cardiovascular;  Laterality: N/A;  ? EYE SURGERY    ? RENAL BIOPSY    ? TOTAL HIP ARTHROPLASTY Right   ? ?Social History:  reports that she has never smoked. She has never used smokeless tobacco. She reports that she does not drink alcohol and does not use drugs. ? ?Allergies  ?Allergen Reactions  ? Byetta 10 Mcg Pen [Exenatide] Nausea And Vomiting and Other (See Comments)  ?  Weight loss ?  ? Ibuprofen Swelling  ?  Arm swelling - not sure if it was the ibuprofen but she uses tylenol instead. Is able to take Mobic so this is not a class allergy.  ? Lisinopril Rash  ? Aspirin Other (See Comments)  ?  Unknown ?  ? ? ?No family history on file. ? ?Prior to Admission medications   ?Medication Sig Start Date End Date Taking? Authorizing Provider  ?acetaminophen (TYLENOL) 500 MG tablet Take 1,000 mg by mouth every 8 (eight) hours as needed for moderate pain.    [provider]  ?acyclovir (ZOVIRAX) 200 MG capsule Take 200 mg by mouth daily.    [provider]  ?amLODipine (NORVASC) 2.5 MG tablet Take 2.5 mg by mouth daily.    [provider]  ?Ascorbic Acid (VITAMIN C) 1000 MG tablet Take 1,000 mg by mouth daily.    [provider]  ?atorvastatin (LIPITOR) 40 MG tablet Take 40 mg by mouth daily at 6 PM.    [provider]  ?calcium gluconate 500 MG tablet Take 1 tablet by mouth daily.    [provider]  ?Cholecalciferol (VITAMIN D3) 1000 units CAPS Take 2,000 Units by mouth every morning.    [provider]   ?dexamethasone (DECADRON) 4 MG tablet Take 8 mg by mouth See admin instructions. Once a week, before Ninlaro treatment.    [provider]  ?docusate sodium (COLACE) 100 MG capsule Take 1 capsule (100 mg total) by mouth 2 (two) times daily. ?Patient taking differently: Take 100 mg by mouth 2 (two) times daily as needed for moderate constipation. 07/04/16   Theodoro Grist, MD  ?DULoxetine (CYMBALTA) 60 MG capsule Take 60 mg by mouth daily.    [provider]  ?ferrous sulfate 325 (65 FE) MG tablet Take 325 mg by mouth daily with breakfast.    [provider]  ?hydrOXYzine (ATARAX/VISTARIL) 10 MG tablet Take 10 mg by mouth 3 (three) times daily as needed for itching.    [provider]  ?levothyroxine (SYNTHROID) 150 MCG tablet Take 150 mcg by mouth daily before breakfast. 06/17/16   [provider]  ?lidocaine-prilocaine (EMLA) cream Apply 1 application topically as needed (dialysis access).    [provider]  ?Kennieth Rad 3  MG capsule Take 3 mg by mouth once a week. Once a week for 3 weeks in a 28 day cycle 07/25/21   [provider]  ?ondansetron (ZOFRAN) 8 MG tablet Take 8 mg by mouth every 8 (eight) hours as needed for nausea or vomiting.    [provider]  ?Oxycodone HCl 10 MG TABS Take 10 mg by mouth 5 (five) times daily. 06/21/16   [provider]  ?polyethylene glycol (MIRALAX / GLYCOLAX) packet Take 17 g by mouth daily as needed for moderate constipation.    [provider]  ?vitamin B-12 (CYANOCOBALAMIN) 1000 MCG tablet Take 1,000 mcg by mouth every morning.    [provider]  ? ? ?Physical Exam: ?Vitals:  ? 03/28/22 0725 03/28/22 0800 03/28/22 0830 03/28/22 0832  ?BP:  (!) 154/68 (!) 151/75   ?Pulse:  (!) 102 (!) 109 (!) 116  ?Resp:  '11 14 13  '$ ?Temp: 98.8 ?F (37.1 ?C)     ?TempSrc: Rectal     ?SpO2:  99% 99% 97%  ?Weight:      ?Height:      ?Physical Exam: ?Blood pressure (!) 151/75, pulse (!) 116, temperature  98.8 ?F (37.1 ?C), temperature source Rectal, resp. rate 13, height '5\' 5"'$  (1.651 m), weight 63 kg, SpO2 97 %. ?Gen: Bright, alert, friendly frail elderly female lying in bed with attentive children at bedsi

## 2022-03-28 NOTE — ED Notes (Signed)
Pt NAD with left longarm splint applied. A/ox4, pt states she had mechanical fall onto left arm. +pmsc.  ?

## 2022-03-28 NOTE — Consult Note (Signed)
?[facility] ?Vascular Consult Note ? ?MRN : [id number] ? ?Christina Mercado is a 86 y.o. ([birth year]) female who presents with chief complaint of  ?Chief Complaint  ?Patient presents with  ? Fall  ?. ? ?History of Present Illness: Patient with ESRD via LEFT brachiocephalic AV fistula. HD on Tuesday and Saturday. Sustained a fall, LEFT Supracondylar fracture. No syncope, no TIA. Denies chest pain. Will require LEFT orthopaedic intervention at [facility] pending transfer. Will need access for HD. Request for permacath placement. ? ?No current facility-administered medications for this encounter.  ? ?Current Outpatient Medications  ?Medication Sig Dispense Refill  ? acetaminophen (TYLENOL) 500 MG tablet Take 1,000 mg by mouth every 8 (eight) hours as needed for moderate pain.    ? acyclovir (ZOVIRAX) 200 MG capsule Take 200 mg by mouth daily.    ? amLODipine (NORVASC) 2.5 MG tablet Take 2.5 mg by mouth daily.    ? Ascorbic Acid (VITAMIN C) 1000 MG tablet Take 1,000 mg by mouth daily.    ? atorvastatin (LIPITOR) 40 MG tablet Take 40 mg by mouth daily at 6 PM.    ? Cholecalciferol (VITAMIN D3) 1000 units CAPS Take 2,000 Units by mouth every morning.    ? dexamethasone (DECADRON) 4 MG tablet Take 8 mg by mouth See admin instructions. Once a week, before [place] treatment.    ? DULoxetine (CYMBALTA) 60 MG capsule Take 60 mg by mouth daily.    ? ferrous sulfate 325 (65 FE) MG tablet Take 325 mg by mouth daily with breakfast.    ? hydrOXYzine (ATARAX/VISTARIL) 10 MG tablet Take 10 mg by mouth 3 (three) times daily as needed for itching.    ? levothyroxine (SYNTHROID) 150 MCG tablet Take 150 mcg by mouth daily before breakfast.    ? lidocaine-prilocaine (EMLA) cream Apply 1 application topically as needed (dialysis access).    ? calcium gluconate 500 MG tablet Take 1 tablet by mouth daily. (Patient not taking: Reported on 03/28/2022)    ? docusate sodium (COLACE) 100 MG capsule Take 1 capsule (100 mg  total) by mouth 2 (two) times daily. (Patient taking differently: Take 100 mg by mouth 2 (two) times daily as needed for moderate constipation.) 10 capsule 0  ? NINLARO 3 MG capsule Take 3 mg by mouth once a week. Once a week for 3 weeks in a 28 day cycle    ? ondansetron (ZOFRAN) 8 MG tablet Take 8 mg by mouth every 8 (eight) hours as needed for nausea or vomiting.    ? Oxycodone HCl 10 MG TABS Take 10 mg by mouth 5 (five) times daily.  0  ? polyethylene glycol (MIRALAX / GLYCOLAX) packet Take 17 g by mouth daily as needed for moderate constipation.    ? vitamin B-12 (CYANOCOBALAMIN) 1000 MCG tablet Take 1,000 mcg by mouth every morning.    ? ? ?Past Medical History:  ?Diagnosis Date  ? Arthritis   ? Diabetes mellitus without complication ([place])   ? Hypertension   ? Kidney failure   ? stage 3  ? Proteinuria   ? ? ?Past Surgical History:  ?Procedure Laterality Date  ? DIALYSIS/PERMA CATHETER INSERTION N/A 05/30/2021  ? Procedure: DIALYSIS/PERMA CATHETER INSERTION;  Surgeon: Algernon Huxley, MD;  Location: [facility];  Service: Cardiovascular;  Laterality: N/A;  ? DIALYSIS/PERMA CATHETER REMOVAL N/A 08/02/2021  ? Procedure: DIALYSIS/PERMA CATHETER REMOVAL;  Surgeon: Katha Cabal, MD;  Location: [facility];  Service: Cardiovascular;  Laterality: N/A;  ? EYE SURGERY    ? RENAL BIOPSY    ? TOTAL HIP ARTHROPLASTY Right   ? ? ?Social History ?Social History  ? ?Tobacco Use  ? Smoking status: Never  ? Smokeless tobacco: Never  ?Vaping Use  ? Vaping Use: Never used  ?Substance Use Topics  ? Alcohol use: No  ? Drug use: Never  ? ? ?Family History ?No family history on file. ? ?Allergies  ?Allergen Reactions  ? Byetta 10 Mcg Pen [Exenatide] Nausea And Vomiting and Other (See Comments)  ?  Weight loss ?  ? Ibuprofen Swelling  ?  Arm swelling - not sure if it was the ibuprofen but she uses tylenol instead. Is able to take Mobic so this is not a class allergy.  ? Lisinopril Rash  ? Aspirin Other (See  Comments)  ?  Unknown ?  ? ? ? ?REVIEW OF SYSTEMS (Negative unless checked) ? ?Constitutional: '[]'$ Weight loss  '[]'$ Fever  '[]'$ Chills ?Cardiac: '[]'$ Chest pain   '[]'$ Chest pressure   '[]'$ Palpitations   '[]'$ Shortness of breath when laying flat   '[]'$ Shortness of breath at rest   '[]'$ Shortness of breath with exertion. ?Vascular:  '[]'$ Pain in legs with walking   '[]'$ Pain in legs at rest   '[]'$ Pain in legs when laying flat   '[]'$ Claudication   '[]'$ Pain in feet when walking  '[]'$ Pain in feet at rest  '[]'$ Pain in feet when laying flat   '[]'$ History of DVT   '[]'$ Phlebitis   '[]'$ Swelling in legs   '[]'$ Varicose veins   '[]'$ Non-healing ulcers ?Pulmonary:   '[]'$ Uses home oxygen   '[]'$ Productive cough   '[]'$ Hemoptysis   '[]'$ Wheeze  '[]'$ COPD   '[]'$ Asthma ?Neurologic:  '[]'$ Dizziness  '[]'$ Blackouts   '[]'$ Seizures   '[]'$ History of stroke   '[]'$ History of TIA  '[]'$ Aphasia   '[]'$ Temporary blindness   '[]'$ Dysphagia   '[]'$ Weakness or numbness in arms   '[]'$ Weakness or numbness in legs ?Musculoskeletal:  '[]'$ Arthritis   '[]'$ Joint swelling   '[x]'$ Joint pain   '[]'$ Low back pain ?Hematologic:  '[]'$ Easy bruising  '[]'$ Easy bleeding   '[]'$ Hypercoagulable state   '[]'$ Anemic  '[]'$ Hepatitis ?Gastrointestinal:  '[]'$ Blood in stool   '[]'$ Vomiting blood  '[]'$ Gastroesophageal reflux/heartburn   '[]'$ Difficulty swallowing. ?Genitourinary:  '[x]'$ Chronic kidney disease   '[]'$ Difficult urination  '[]'$ Frequent urination  '[]'$ Burning with urination   '[]'$ Blood in urine ?Skin:  '[]'$ Rashes   '[]'$ Ulcers   '[]'$ Wounds ?Psychological:  '[]'$ History of anxiety   '[]'$  History of major depression. ? ?Physical Examination ? ?Vitals:  ? 03/28/22 0725 03/28/22 0800 03/28/22 0830 03/28/22 0832  ?BP:  (!) 154/68 (!) 151/75   ?Pulse:  (!) 102 (!) 109 (!) 116  ?Resp:  '11 14 13  '$ ?Temp: 98.8 ?F (37.1 ?C)     ?TempSrc: Rectal     ?SpO2:  99% 99% 97%  ?Weight:      ?Height:      ? ?Body mass index is 23.13 kg/m?. ?Gen:  WD/WN, NAD ?Neck: Trachea midline.  No JVD.  ?Pulmonary:  Good air movement, respirations not labored, equal bilaterally.  ?Cardiac: RRR, normal S1, S2. ?Vascular: LEFT arm in  splint-unable to evaluate fistula, hand warm ?Vessel Right Left  ?Radial Palpable Palpable  ?Ulnar    ?Brachial    ?Carotid Palpable, without bruit Palpable, without bruit  ?Aorta    ?Femoral    ?Popliteal    ?PT    ?DP warm warm  ? ?Gastrointestinal: soft, non-tender/non-distended. No guarding/reflex.  ?Musculoskeletal: M/S 5/5 throughout.  Extremities without ischemic changes.  LEFT arm in splint-  hand warm ?Neurologic: Sensation grossly intact in extremities.  Symmetrical.  Speech is fluent. Motor exam as listed above. ?Psychiatric: Judgment intact, Mood & affect appropriate for pt's clinical situation. ?Dermatologic: No rashes or ulcers noted.  No cellulitis or open wounds. ?Lymph : No Cervical, Axillary, or Inguinal lymphadenopathy. ? ? ? ? ?CBC ?Lab Results  ?Component Value Date  ? WBC 12.3 (H) 03/28/2022  ? HGB 10.8 (L) 03/28/2022  ? HCT 34.4 (L) 03/28/2022  ? MCV 95.3 03/28/2022  ? PLT 242 03/28/2022  ? ? ?BMET ?   ?Component Value Date/Time  ? NA 141 03/28/2022 0631  ? K 5.0 03/28/2022 0631  ? CL 100 03/28/2022 0631  ? CO2 27 03/28/2022 0631  ? GLUCOSE 141 (H) 03/28/2022 0631  ? BUN 54 (H) 03/28/2022 0631  ? CREATININE 6.46 (H) 03/28/2022 0631  ? CALCIUM 9.3 03/28/2022 0631  ? GFRNONAA 6 (L) 03/28/2022 0631  ? GFRAA 14 (L) 06/18/2020 1337  ? ?Estimated Creatinine Clearance: 5.9 mL/min (A) (by C-G formula based on SCr of 6.46 mg/dL (H)). ? ?COAG ?Lab Results  ?Component Value Date  ? INR 1.1 05/27/2021  ? INR 1.05 08/29/2018  ? INR 0.90 06/02/2018  ? ? ?Radiology ?DG Elbow Complete Left ? ?Result Date: 03/28/2022 ?CLINICAL DATA:  Fall. EXAM: LEFT ELBOW - COMPLETE 3+ VIEW COMPARISON:  None. FINDINGS: There is a displaced supracondylar fracture with probable impaction. There is medial displacement of the distal fracture fragment. No dislocation. The bones are osteopenic. There is joint effusion. The soft tissue swelling of the elbow. IMPRESSION: Displaced supracondylar fracture. Electronically Signed   By:  Anner Crete M.D.   On: 03/28/2022 00:42  ? ?CT HEAD WO CONTRAST (5MM) ? ?Result Date: 03/28/2022 ?CLINICAL DATA:  Trauma. EXAM: CT HEAD WITHOUT CONTRAST CT CERVICAL SPINE WITHOUT CONTRAST TECHNIQUE: Multidetecto

## 2022-03-28 NOTE — ED Notes (Signed)
Per Dr. Charlsie Quest  patient accepted by hospitalist at [facility] pending bed assignment which may take until tomorrow 4/19 ?

## 2022-03-28 NOTE — ED Notes (Signed)
[facility] called for consult and transfer ?

## 2022-03-28 NOTE — ED Notes (Signed)
[facility] for consult and transfer ?[facility] declined due to capacity ?

## 2022-03-28 NOTE — OR Nursing (Addendum)
Transferred to dialysis via ED stretcher, son gave verbal consent via phone for dialysis, bedside report given to [place]. Pt to go to ED after dialysis to wait for transfer to [facility].  ?

## 2022-03-28 NOTE — ED Notes (Signed)
Provided mouth swabs. Pt is NPO not even ice chips. ?

## 2022-03-28 NOTE — Progress Notes (Addendum)
Hemodialysis Post Treatment Note: ? ?Tx date:03/28/2022 ?Tx time:3 hours ?Access: right CVC ?UF Removed: 75m ? ?Note: ?HD completed. However, at the end of the treatment, patient complained of right ankle and leg cramps. Upon blood return, additional 250 ml of saline is given aside from the 250 ml rinsed back. Ankle and leg cramps relieved after. ? ? ? ? ?  ? ? ? ?  ?

## 2022-03-29 ENCOUNTER — Inpatient Hospital Stay (HOSPITAL_COMMUNITY): Payer: Medicare Other

## 2022-03-29 ENCOUNTER — Encounter (HOSPITAL_COMMUNITY): Payer: Self-pay | Admitting: Internal Medicine

## 2022-03-29 ENCOUNTER — Inpatient Hospital Stay (HOSPITAL_COMMUNITY): Admission: RE | Admit: 2022-03-29 | Payer: Medicare Other | Source: Home / Self Care | Admitting: Student

## 2022-03-29 ENCOUNTER — Inpatient Hospital Stay (HOSPITAL_COMMUNITY): Payer: Medicare Other | Admitting: Certified Registered"

## 2022-03-29 ENCOUNTER — Encounter (HOSPITAL_COMMUNITY)
Admission: AD | Disposition: A | Discharge status: Rehabilitation Facility | Payer: Self-pay | Source: Other Acute Inpatient Hospital | Attending: Internal Medicine

## 2022-03-29 ENCOUNTER — Other Ambulatory Visit: Payer: Self-pay

## 2022-03-29 ENCOUNTER — Inpatient Hospital Stay (HOSPITAL_COMMUNITY)
Admission: AD | Admit: 2022-03-29 | Discharge: 2022-04-01 | DRG: 492 | Disposition: A | Discharge status: Rehabilitation Facility | Payer: Medicare Other | Source: Other Acute Inpatient Hospital | Attending: Internal Medicine | Admitting: Internal Medicine

## 2022-03-29 DIAGNOSIS — F418 Other specified anxiety disorders: Secondary | ICD-10-CM

## 2022-03-29 DIAGNOSIS — K5900 Constipation, unspecified: Secondary | ICD-10-CM | POA: Diagnosis present

## 2022-03-29 DIAGNOSIS — C9000 Multiple myeloma not having achieved remission: Secondary | ICD-10-CM | POA: Diagnosis present

## 2022-03-29 DIAGNOSIS — B9629 Other Escherichia coli [E. coli] as the cause of diseases classified elsewhere: Secondary | ICD-10-CM | POA: Diagnosis present

## 2022-03-29 DIAGNOSIS — F32A Depression, unspecified: Secondary | ICD-10-CM | POA: Diagnosis present

## 2022-03-29 DIAGNOSIS — E039 Hypothyroidism, unspecified: Secondary | ICD-10-CM

## 2022-03-29 DIAGNOSIS — M1990 Unspecified osteoarthritis, unspecified site: Secondary | ICD-10-CM

## 2022-03-29 DIAGNOSIS — Z886 Allergy status to analgesic agent status: Secondary | ICD-10-CM

## 2022-03-29 DIAGNOSIS — G4700 Insomnia, unspecified: Secondary | ICD-10-CM | POA: Diagnosis present

## 2022-03-29 DIAGNOSIS — S42412D Displaced simple supracondylar fracture without intercondylar fracture of left humerus, subsequent encounter for fracture with routine healing: Secondary | ICD-10-CM | POA: Diagnosis not present

## 2022-03-29 DIAGNOSIS — E1129 Type 2 diabetes mellitus with other diabetic kidney complication: Secondary | ICD-10-CM | POA: Diagnosis present

## 2022-03-29 DIAGNOSIS — D631 Anemia in chronic kidney disease: Secondary | ICD-10-CM | POA: Diagnosis present

## 2022-03-29 DIAGNOSIS — N390 Urinary tract infection, site not specified: Secondary | ICD-10-CM | POA: Diagnosis present

## 2022-03-29 DIAGNOSIS — Z794 Long term (current) use of insulin: Secondary | ICD-10-CM | POA: Diagnosis not present

## 2022-03-29 DIAGNOSIS — Z79899 Other long term (current) drug therapy: Secondary | ICD-10-CM

## 2022-03-29 DIAGNOSIS — Z96641 Presence of right artificial hip joint: Secondary | ICD-10-CM | POA: Diagnosis present

## 2022-03-29 DIAGNOSIS — D649 Anemia, unspecified: Secondary | ICD-10-CM | POA: Diagnosis present

## 2022-03-29 DIAGNOSIS — Z888 Allergy status to other drugs, medicaments and biological substances status: Secondary | ICD-10-CM

## 2022-03-29 DIAGNOSIS — N250 Renal osteodystrophy: Secondary | ICD-10-CM | POA: Diagnosis present

## 2022-03-29 DIAGNOSIS — M069 Rheumatoid arthritis, unspecified: Secondary | ICD-10-CM | POA: Diagnosis present

## 2022-03-29 DIAGNOSIS — R112 Nausea with vomiting, unspecified: Secondary | ICD-10-CM | POA: Diagnosis not present

## 2022-03-29 DIAGNOSIS — R34 Anuria and oliguria: Secondary | ICD-10-CM | POA: Diagnosis present

## 2022-03-29 DIAGNOSIS — M059 Rheumatoid arthritis with rheumatoid factor, unspecified: Secondary | ICD-10-CM | POA: Diagnosis present

## 2022-03-29 DIAGNOSIS — H9190 Unspecified hearing loss, unspecified ear: Secondary | ICD-10-CM | POA: Diagnosis present

## 2022-03-29 DIAGNOSIS — I120 Hypertensive chronic kidney disease with stage 5 chronic kidney disease or end stage renal disease: Secondary | ICD-10-CM | POA: Diagnosis present

## 2022-03-29 DIAGNOSIS — I951 Orthostatic hypotension: Secondary | ICD-10-CM | POA: Diagnosis present

## 2022-03-29 DIAGNOSIS — W010XXD Fall on same level from slipping, tripping and stumbling without subsequent striking against object, subsequent encounter: Secondary | ICD-10-CM | POA: Diagnosis present

## 2022-03-29 DIAGNOSIS — Z79890 Hormone replacement therapy (postmenopausal): Secondary | ICD-10-CM | POA: Diagnosis not present

## 2022-03-29 DIAGNOSIS — E663 Overweight: Secondary | ICD-10-CM | POA: Diagnosis present

## 2022-03-29 DIAGNOSIS — Z6825 Body mass index (BMI) 25.0-25.9, adult: Secondary | ICD-10-CM | POA: Diagnosis not present

## 2022-03-29 DIAGNOSIS — Z992 Dependence on renal dialysis: Secondary | ICD-10-CM | POA: Diagnosis not present

## 2022-03-29 DIAGNOSIS — W010XXA Fall on same level from slipping, tripping and stumbling without subsequent striking against object, initial encounter: Secondary | ICD-10-CM | POA: Diagnosis present

## 2022-03-29 DIAGNOSIS — E875 Hyperkalemia: Secondary | ICD-10-CM | POA: Diagnosis not present

## 2022-03-29 DIAGNOSIS — E785 Hyperlipidemia, unspecified: Secondary | ICD-10-CM | POA: Diagnosis present

## 2022-03-29 DIAGNOSIS — N186 End stage renal disease: Secondary | ICD-10-CM | POA: Diagnosis present

## 2022-03-29 DIAGNOSIS — S42402A Unspecified fracture of lower end of left humerus, initial encounter for closed fracture: Secondary | ICD-10-CM | POA: Diagnosis not present

## 2022-03-29 DIAGNOSIS — S42412A Displaced simple supracondylar fracture without intercondylar fracture of left humerus, initial encounter for closed fracture: Secondary | ICD-10-CM

## 2022-03-29 DIAGNOSIS — S42415A Nondisplaced simple supracondylar fracture without intercondylar fracture of left humerus, initial encounter for closed fracture: Secondary | ICD-10-CM | POA: Diagnosis not present

## 2022-03-29 DIAGNOSIS — E11649 Type 2 diabetes mellitus with hypoglycemia without coma: Secondary | ICD-10-CM | POA: Diagnosis not present

## 2022-03-29 DIAGNOSIS — E1122 Type 2 diabetes mellitus with diabetic chronic kidney disease: Secondary | ICD-10-CM | POA: Diagnosis present

## 2022-03-29 DIAGNOSIS — F419 Anxiety disorder, unspecified: Secondary | ICD-10-CM | POA: Diagnosis present

## 2022-03-29 DIAGNOSIS — Z7409 Other reduced mobility: Secondary | ICD-10-CM | POA: Diagnosis present

## 2022-03-29 DIAGNOSIS — Z86711 Personal history of pulmonary embolism: Secondary | ICD-10-CM

## 2022-03-29 DIAGNOSIS — Z79891 Long term (current) use of opiate analgesic: Secondary | ICD-10-CM

## 2022-03-29 DIAGNOSIS — Z86718 Personal history of other venous thrombosis and embolism: Secondary | ICD-10-CM

## 2022-03-29 HISTORY — PX: ORIF ELBOW FRACTURE: SHX5031

## 2022-03-29 LAB — GLUCOSE, CAPILLARY
Glucose-Capillary: 124 mg/dL — ABNORMAL HIGH (ref 70–99)
Glucose-Capillary: 117 mg/dL — ABNORMAL HIGH (ref 70–99)
Glucose-Capillary: 93 mg/dL (ref 70–99)
Glucose-Capillary: 139 mg/dL — ABNORMAL HIGH (ref 70–99)
Glucose-Capillary: 117 mg/dL — ABNORMAL HIGH (ref 70–99)

## 2022-03-29 LAB — CBC WITH DIFFERENTIAL/PLATELET
Abs Immature Granulocytes: 0.04 10*3/uL (ref 0.00–0.07)
Basophils Absolute: 0.1 10*3/uL (ref 0.0–0.1)
Basophils Relative: 1 %
Eosinophils Absolute: 0.1 10*3/uL (ref 0.0–0.5)
Eosinophils Relative: 1 %
HCT: 33.2 % — ABNORMAL LOW (ref 36.0–46.0)
Hemoglobin: 10.8 g/dL — ABNORMAL LOW (ref 12.0–15.0)
Immature Granulocytes: 0 %
Lymphocytes Relative: 22 %
Lymphs Abs: 2 10*3/uL (ref 0.7–4.0)
MCH: 31.4 pg (ref 26.0–34.0)
MCHC: 32.5 g/dL (ref 30.0–36.0)
MCV: 96.5 fL (ref 80.0–100.0)
Monocytes Absolute: 1.4 10*3/uL — ABNORMAL HIGH (ref 0.1–1.0)
Monocytes Relative: 15 %
Neutro Abs: 5.7 10*3/uL (ref 1.7–7.7)
Neutrophils Relative %: 61 %
Platelets: 195 10*3/uL (ref 150–400)
RBC: 3.44 MIL/uL — ABNORMAL LOW (ref 3.87–5.11)
RDW: 15.2 % (ref 11.5–15.5)
WBC: 9.3 10*3/uL (ref 4.0–10.5)
nRBC: 0 % (ref 0.0–0.2)

## 2022-03-29 LAB — HEPATIC FUNCTION PANEL
ALT: 12 U/L (ref 0–44)
AST: 18 U/L (ref 15–41)
Albumin: 3 g/dL — ABNORMAL LOW (ref 3.5–5.0)
Alkaline Phosphatase: 65 U/L (ref 38–126)
Bilirubin, Direct: 0.2 mg/dL (ref 0.0–0.2)
Indirect Bilirubin: 0.4 mg/dL (ref 0.3–0.9)
Total Bilirubin: 0.6 mg/dL (ref 0.3–1.2)
Total Protein: 5.9 g/dL — ABNORMAL LOW (ref 6.5–8.1)

## 2022-03-29 LAB — RENAL FUNCTION PANEL
Albumin: 2.9 g/dL — ABNORMAL LOW (ref 3.5–5.0)
Anion gap: 12 (ref 5–15)
BUN: 25 mg/dL — ABNORMAL HIGH (ref 8–23)
CO2: 26 mmol/L (ref 22–32)
Calcium: 9.1 mg/dL (ref 8.9–10.3)
Chloride: 100 mmol/L (ref 98–111)
Creatinine, Ser: 4.49 mg/dL — ABNORMAL HIGH (ref 0.44–1.00)
GFR, Estimated: 9 mL/min — ABNORMAL LOW (ref >60)
Glucose, Bld: 137 mg/dL — ABNORMAL HIGH (ref 70–99)
Phosphorus: 5.8 mg/dL — ABNORMAL HIGH (ref 2.5–4.6)
Potassium: 4 mmol/L (ref 3.5–5.1)
Sodium: 138 mmol/L (ref 135–145)

## 2022-03-29 LAB — BASIC METABOLIC PANEL
Anion gap: 9 (ref 5–15)
BUN: 22 mg/dL (ref 8–23)
CO2: 29 mmol/L (ref 22–32)
Calcium: 9.3 mg/dL (ref 8.9–10.3)
Chloride: 99 mmol/L (ref 98–111)
Creatinine, Ser: 4.1 mg/dL — ABNORMAL HIGH (ref 0.44–1.00)
GFR, Estimated: 10 mL/min — ABNORMAL LOW (ref >60)
Glucose, Bld: 118 mg/dL — ABNORMAL HIGH (ref 70–99)
Potassium: 4.9 mmol/L (ref 3.5–5.1)
Sodium: 137 mmol/L (ref 135–145)

## 2022-03-29 LAB — SURGICAL PCR SCREEN
MRSA, PCR: NEGATIVE
Staphylococcus aureus: NEGATIVE

## 2022-03-29 LAB — VITAMIN D 25 HYDROXY (VIT D DEFICIENCY, FRACTURES): Vit D, 25-Hydroxy: 57.43 ng/mL (ref 30–100)

## 2022-03-29 LAB — HEPATITIS B SURFACE ANTIBODY, QUANTITATIVE: Hep B S AB Quant (Post): 140.2 m[IU]/mL (ref >9.9)

## 2022-03-29 LAB — HEMOGLOBIN A1C
Hgb A1c MFr Bld: 5.6 % (ref 4.8–5.6)
Mean Plasma Glucose: 114.02 mg/dL

## 2022-03-29 SURGERY — OPEN REDUCTION INTERNAL FIXATION (ORIF) ELBOW/OLECRANON FRACTURE
Anesthesia: General | Site: Elbow | Laterality: Left

## 2022-03-29 MED ORDER — FENTANYL CITRATE (PF) 250 MCG/5ML IJ SOLN
INTRAMUSCULAR | Status: AC
Start: 2022-03-29 — End: ?
  Filled 2022-03-29: qty 5

## 2022-03-29 MED ORDER — OXYCODONE HCL 5 MG PO TABS
5.0000 mg | ORAL_TABLET | Freq: Every evening | ORAL | Status: DC | PRN
  Administered 2022-03-31: 5 mg via ORAL
  Filled 2022-03-29: qty 1

## 2022-03-29 MED ORDER — ACETAMINOPHEN 325 MG PO TABS: 650.0000 mg | ORAL_TABLET | Freq: Four times a day (QID) | ORAL | Status: DC | PRN

## 2022-03-29 MED ORDER — DROPERIDOL 2.5 MG/ML IJ SOLN: 0.6250 mg | Freq: Once | INTRAMUSCULAR | Status: DC | PRN

## 2022-03-29 MED ORDER — FENTANYL CITRATE (PF) 100 MCG/2ML IJ SOLN: 50.0000 ug | Freq: Once | INTRAMUSCULAR | Status: AC

## 2022-03-29 MED ORDER — CHLORHEXIDINE GLUCONATE 0.12 % MT SOLN
15.0000 mL | Freq: Once | OROMUCOSAL | Status: AC
  Administered 2022-03-29: 15 mL via OROMUCOSAL
  Filled 2022-03-29: qty 15

## 2022-03-29 MED ORDER — PHENYLEPHRINE HCL-NACL 20-0.9 MG/250ML-% IV SOLN
INTRAVENOUS | Status: DC | PRN
  Administered 2022-03-29: 50 ug/min via INTRAVENOUS

## 2022-03-29 MED ORDER — VANCOMYCIN HCL 1000 MG IV SOLR
INTRAVENOUS | Status: DC | PRN
  Administered 2022-03-29: 1000 mg

## 2022-03-29 MED ORDER — PRISMASOL BGK 4/2.5 32-4-2.5 MEQ/L REPLACEMENT SOLN: Status: DC

## 2022-03-29 MED ORDER — INSULIN ASPART 100 UNIT/ML IJ SOLN: 0.0000 [IU] | INTRAMUSCULAR | Status: DC

## 2022-03-29 MED ORDER — PROPOFOL 10 MG/ML IV BOLUS
INTRAVENOUS | Status: DC | PRN
  Administered 2022-03-29: 60 mg via INTRAVENOUS

## 2022-03-29 MED ORDER — VANCOMYCIN HCL 1000 MG IV SOLR
INTRAVENOUS | Status: AC
  Filled 2022-03-29: qty 20

## 2022-03-29 MED ORDER — CLONIDINE HCL (ANALGESIA) 100 MCG/ML EP SOLN
EPIDURAL | Status: DC | PRN
  Administered 2022-03-29: 50 ug

## 2022-03-29 MED ORDER — ROCURONIUM BROMIDE 10 MG/ML (PF) SYRINGE
PREFILLED_SYRINGE | INTRAVENOUS | Status: DC | PRN
  Administered 2022-03-29: 40 mg via INTRAVENOUS

## 2022-03-29 MED ORDER — ORAL CARE MOUTH RINSE: 15.0000 mL | Freq: Once | OROMUCOSAL | Status: AC

## 2022-03-29 MED ORDER — ONDANSETRON HCL 4 MG/2ML IJ SOLN
INTRAMUSCULAR | Status: DC | PRN
  Administered 2022-03-29: 4 mg via INTRAVENOUS

## 2022-03-29 MED ORDER — SODIUM CHLORIDE 0.9 % IV SOLN: INTRAVENOUS | Status: DC

## 2022-03-29 MED ORDER — VITAMIN D3 25 MCG (1000 UNIT) PO TABS
2000.0000 [IU] | ORAL_TABLET | ORAL | Status: DC
  Administered 2022-03-30 – 2022-04-01 (×3): 2000 [IU] via ORAL
  Filled 2022-03-29 (×6): qty 2

## 2022-03-29 MED ORDER — PHENYLEPHRINE HCL (PRESSORS) 10 MG/ML IV SOLN
INTRAVENOUS | Status: DC | PRN
  Administered 2022-03-29 (×2): 80 ug via INTRAVENOUS

## 2022-03-29 MED ORDER — PHENYLEPHRINE HCL (PRESSORS) 10 MG/ML IV SOLN
INTRAVENOUS | Status: AC
  Filled 2022-03-29: qty 1

## 2022-03-29 MED ORDER — METOCLOPRAMIDE HCL 5 MG PO TABS: 5.0000 mg | ORAL_TABLET | Freq: Three times a day (TID) | ORAL | Status: DC | PRN

## 2022-03-29 MED ORDER — DEXAMETHASONE SODIUM PHOSPHATE 4 MG/ML IJ SOLN
INTRAMUSCULAR | Status: DC | PRN
  Administered 2022-03-29: 5 mg via PERINEURAL

## 2022-03-29 MED ORDER — ROPIVACAINE HCL 5 MG/ML IJ SOLN: INTRAMUSCULAR | Status: DC | PRN

## 2022-03-29 MED ORDER — ROPIVACAINE HCL 5 MG/ML IJ SOLN
INTRAMUSCULAR | Status: DC | PRN
  Administered 2022-03-29: 30 mL via PERINEURAL

## 2022-03-29 MED ORDER — POVIDONE-IODINE 10 % EX SWAB: 2.0000 "application " | Freq: Once | CUTANEOUS | Status: DC

## 2022-03-29 MED ORDER — DOCUSATE SODIUM 100 MG PO CAPS
100.0000 mg | ORAL_CAPSULE | Freq: Two times a day (BID) | ORAL | Status: DC
  Administered 2022-03-29 – 2022-03-31 (×5): 100 mg via ORAL
  Filled 2022-03-29 (×6): qty 1

## 2022-03-29 MED ORDER — PROPOFOL 10 MG/ML IV BOLUS
INTRAVENOUS | Status: AC
  Filled 2022-03-29: qty 20

## 2022-03-29 MED ORDER — ACETAMINOPHEN 325 MG PO TABS
650.0000 mg | ORAL_TABLET | Freq: Four times a day (QID) | ORAL | Status: DC
  Administered 2022-03-29 – 2022-04-01 (×9): 650 mg via ORAL
  Filled 2022-03-29 (×10): qty 2

## 2022-03-29 MED ORDER — PRISMASOL BGK 4/2.5 32-4-2.5 MEQ/L EC SOLN: Status: DC

## 2022-03-29 MED ORDER — LIDOCAINE 2% (20 MG/ML) 5 ML SYRINGE
INTRAMUSCULAR | Status: DC | PRN
  Administered 2022-03-29: 50 mg via INTRAVENOUS

## 2022-03-29 MED ORDER — HEPARIN SODIUM (PORCINE) 1000 UNIT/ML DIALYSIS: 1000.0000 [IU] | INTRAMUSCULAR | Status: DC | PRN

## 2022-03-29 MED ORDER — SODIUM CHLORIDE 0.9 % IV SOLN
1.0000 g | INTRAVENOUS | Status: DC
Start: 2022-03-29 — End: 2022-04-01
  Administered 2022-03-29 – 2022-04-01 (×4): 1 g via INTRAVENOUS
  Filled 2022-03-29 (×5): qty 10

## 2022-03-29 MED ORDER — INSULIN ASPART 100 UNIT/ML IJ SOLN: 0.0000 [IU] | INTRAMUSCULAR | Status: DC | PRN

## 2022-03-29 MED ORDER — FENTANYL CITRATE (PF) 100 MCG/2ML IJ SOLN
INTRAMUSCULAR | Status: AC
  Administered 2022-03-29: 50 ug via INTRAVENOUS
  Filled 2022-03-29: qty 2

## 2022-03-29 MED ORDER — 0.9 % SODIUM CHLORIDE (POUR BTL) OPTIME
TOPICAL | Status: DC | PRN
Start: 2022-03-29 — End: 2022-03-29
  Administered 2022-03-29: 1000 mL

## 2022-03-29 MED ORDER — SODIUM CHLORIDE 0.9 % FOR CRRT: INTRAVENOUS_CENTRAL | Status: DC | PRN

## 2022-03-29 MED ORDER — ACETAMINOPHEN 500 MG PO TABS
1000.0000 mg | ORAL_TABLET | Freq: Once | ORAL | Status: AC
  Administered 2022-03-29: 1000 mg via ORAL
  Filled 2022-03-29: qty 2

## 2022-03-29 MED ORDER — CHLORHEXIDINE GLUCONATE 4 % EX LIQD: 60.0000 mL | Freq: Once | CUTANEOUS | Status: DC

## 2022-03-29 MED ORDER — METOCLOPRAMIDE HCL 5 MG/ML IJ SOLN
5.0000 mg | Freq: Three times a day (TID) | INTRAMUSCULAR | Status: DC | PRN
  Administered 2022-03-31: 10 mg via INTRAVENOUS
  Filled 2022-03-29: qty 2

## 2022-03-29 MED ORDER — HYDRALAZINE HCL 20 MG/ML IJ SOLN
10.0000 mg | INTRAMUSCULAR | Status: DC | PRN
Start: 2022-03-29 — End: 2022-04-01

## 2022-03-29 MED ORDER — ACETAMINOPHEN 650 MG RE SUPP: 650.0000 mg | Freq: Four times a day (QID) | RECTAL | Status: DC | PRN

## 2022-03-29 MED ORDER — ONDANSETRON HCL 4 MG/2ML IJ SOLN
4.0000 mg | Freq: Four times a day (QID) | INTRAMUSCULAR | Status: DC | PRN
Start: 2022-03-29 — End: 2022-04-01
  Administered 2022-03-31: 4 mg via INTRAVENOUS
  Filled 2022-03-29: qty 2

## 2022-03-29 MED ORDER — SODIUM CHLORIDE 0.9 % IV SOLN: INTRAVENOUS | Status: DC | PRN

## 2022-03-29 MED ORDER — FENTANYL CITRATE (PF) 100 MCG/2ML IJ SOLN: 25.0000 ug | INTRAMUSCULAR | Status: DC | PRN

## 2022-03-29 MED ORDER — ONDANSETRON HCL 4 MG PO TABS: 4.0000 mg | ORAL_TABLET | Freq: Four times a day (QID) | ORAL | Status: DC | PRN

## 2022-03-29 MED ORDER — OXYCODONE HCL 5 MG PO TABS
10.0000 mg | ORAL_TABLET | Freq: Four times a day (QID) | ORAL | Status: DC | PRN
  Administered 2022-03-30 – 2022-03-31 (×6): 10 mg via ORAL
  Filled 2022-03-29 (×6): qty 2

## 2022-03-29 MED ORDER — INSULIN ASPART 100 UNIT/ML IJ SOLN
0.0000 [IU] | INTRAMUSCULAR | Status: DC
  Administered 2022-03-30 – 2022-03-31 (×4): 1 [IU] via SUBCUTANEOUS

## 2022-03-29 MED ORDER — SUGAMMADEX SODIUM 200 MG/2ML IV SOLN
INTRAVENOUS | Status: DC | PRN
Start: 2022-03-29 — End: 2022-03-29
  Administered 2022-03-29: 200 mg via INTRAVENOUS

## 2022-03-29 MED ORDER — POLYETHYLENE GLYCOL 3350 17 G PO PACK: 17.0000 g | PACK | Freq: Every day | ORAL | Status: DC | PRN

## 2022-03-29 MED ORDER — CEFAZOLIN SODIUM-DEXTROSE 2-4 GM/100ML-% IV SOLN: 2.0000 g | INTRAVENOUS | Status: DC

## 2022-03-29 SURGICAL SUPPLY — 78 items
APL PRP STRL LF DISP 70% ISPRP (MISCELLANEOUS) ×1
BAG COUNTER SPONGE SURGICOUNT (BAG) ×2 IMPLANT
BAG SPNG CNTER NS LX DISP (BAG) ×1
BIT DRILL QC 2.0 SHORT EVOS SM (DRILL) IMPLANT
BIT DRILL QC 2.5MM SHRT EVO SM (DRILL) IMPLANT
BLADE AVERAGE 25X9 (BLADE) IMPLANT
BLADE CLIPPER SURG (BLADE) ×2 IMPLANT
BLADE SURG 10 STRL SS (BLADE) IMPLANT
BNDG ELASTIC 3X5.8 VLCR STR LF (GAUZE/BANDAGES/DRESSINGS) ×2 IMPLANT
BNDG ELASTIC 4X5.8 VLCR STR LF (GAUZE/BANDAGES/DRESSINGS) ×2 IMPLANT
BNDG ELASTIC 6X5.8 VLCR STR LF (GAUZE/BANDAGES/DRESSINGS) ×2 IMPLANT
BRUSH SCRUB EZ PLAIN DRY (MISCELLANEOUS) ×4 IMPLANT
CHLORAPREP W/TINT 26 (MISCELLANEOUS) ×2 IMPLANT
CLEANER TIP ELECTROSURG 2X2 (MISCELLANEOUS) ×2 IMPLANT
COVER SURGICAL LIGHT HANDLE (MISCELLANEOUS) ×4 IMPLANT
DRAPE C-ARM 42X72 X-RAY (DRAPES) ×2 IMPLANT
DRAPE C-ARMOR (DRAPES) ×2 IMPLANT
DRAPE INCISE IOBAN 66X45 STRL (DRAPES) IMPLANT
DRAPE ORTHO SPLIT 77X108 STRL (DRAPES) ×4
DRAPE SURG ORHT 6 SPLT 77X108 (DRAPES) ×2 IMPLANT
DRAPE U-SHAPE 47X51 STRL (DRAPES) ×2 IMPLANT
DRILL QC 2.0 SHORT EVOS SM (DRILL) ×2
DRILL QC 2.5MM SHORT EVOS SM (DRILL) ×2
DRSG MEPILEX BORDER 4X8 (GAUZE/BANDAGES/DRESSINGS) ×1 IMPLANT
ELECT REM PT RETURN 9FT ADLT (ELECTROSURGICAL) ×2
ELECTRODE REM PT RTRN 9FT ADLT (ELECTROSURGICAL) ×1 IMPLANT
GLOVE BIO SURGEON STRL SZ 6.5 (GLOVE) ×6 IMPLANT
GLOVE BIO SURGEON STRL SZ7.5 (GLOVE) ×8 IMPLANT
GLOVE BIOGEL PI IND STRL 6.5 (GLOVE) ×1 IMPLANT
GLOVE BIOGEL PI IND STRL 7.5 (GLOVE) ×1 IMPLANT
GLOVE BIOGEL PI INDICATOR 6.5 (GLOVE) ×1
GLOVE BIOGEL PI INDICATOR 7.5 (GLOVE) ×1
GOWN STRL REUS W/ TWL LRG LVL3 (GOWN DISPOSABLE) ×2 IMPLANT
GOWN STRL REUS W/TWL LRG LVL3 (GOWN DISPOSABLE) ×4
K-WIRE 1.6 (WIRE) ×10
K-WIRE FX150X1.6XTROC PNT (WIRE) ×5
KIT BASIN OR (CUSTOM PROCEDURE TRAY) ×2 IMPLANT
KIT TURNOVER KIT B (KITS) ×2 IMPLANT
KWIRE FX150X1.6XTROC PNT (WIRE) IMPLANT
LOOP VESSEL MAXI BLUE (MISCELLANEOUS) ×1 IMPLANT
MANIFOLD NEPTUNE II (INSTRUMENTS) ×2 IMPLANT
NS IRRIG 1000ML POUR BTL (IV SOLUTION) ×2 IMPLANT
PACK ORTHO EXTREMITY (CUSTOM PROCEDURE TRAY) ×2 IMPLANT
PAD ARMBOARD 7.5X6 YLW CONV (MISCELLANEOUS) ×4 IMPLANT
PAD CAST 3X4 CTTN HI CHSV (CAST SUPPLIES) IMPLANT
PAD CAST 4YDX4 CTTN HI CHSV (CAST SUPPLIES) ×1 IMPLANT
PADDING CAST COTTON 3X4 STRL (CAST SUPPLIES) ×2
PADDING CAST COTTON 4X4 STRL (CAST SUPPLIES) ×2
PLATE HUM EVOS 3H L 2.7X80 (Plate) ×1 IMPLANT
PLATE HUM EVOS 6H L 2.7X85 (Plate) ×1 IMPLANT
SCREW CORT 3.5X22 ST EVOS (Screw) ×3 IMPLANT
SCREW CORT VA EVOS 2.7X28 (Screw) ×1 IMPLANT
SCREW CORTEX 3.5X24MM (Screw) ×1 IMPLANT
SCREW CORTEX 3.5X26 (Screw) ×1 IMPLANT
SCREW LOCK 2.7X30 (Screw) ×1 IMPLANT
SCREW LOCK ST EVOS 2.7X22 (Screw) ×1 IMPLANT
SCREW LOCK ST EVOS 2.7X24 (Screw) ×2 IMPLANT
SCREW LOCK ST EVOS 2.7X36 (Screw) ×2 IMPLANT
SLING ARM FOAM STRAP MED (SOFTGOODS) ×1 IMPLANT
SPONGE T-LAP 18X18 ~~LOC~~+RFID (SPONGE) ×4 IMPLANT
SUCTION FRAZIER HANDLE 10FR (MISCELLANEOUS) ×1
SUCTION TUBE FRAZIER 10FR DISP (MISCELLANEOUS) ×1 IMPLANT
SUT MNCRL AB 3-0 PS2 18 (SUTURE) ×2 IMPLANT
SUT MON AB 2-0 CT1 36 (SUTURE) ×2 IMPLANT
SUT PDS AB 2-0 CT1 27 (SUTURE) IMPLANT
SUT PROLENE 0 CT (SUTURE) IMPLANT
SUT PROLENE 3 0 PS 2 (SUTURE) ×4 IMPLANT
SUT VIC AB 0 CT1 27 (SUTURE) ×4
SUT VIC AB 0 CT1 27XBRD ANBCTR (SUTURE) ×2 IMPLANT
SUT VIC AB 2-0 CT1 27 (SUTURE) ×4
SUT VIC AB 2-0 CT1 TAPERPNT 27 (SUTURE) ×2 IMPLANT
SUT VIC AB 2-0 CT3 27 (SUTURE) IMPLANT
SYR CONTROL 10ML LL (SYRINGE) ×2 IMPLANT
TOWEL GREEN STERILE (TOWEL DISPOSABLE) ×4 IMPLANT
TOWEL GREEN STERILE FF (TOWEL DISPOSABLE) IMPLANT
TUBE CONNECTING 12X1/4 (SUCTIONS) ×2 IMPLANT
UNDERPAD 30X36 HEAVY ABSORB (UNDERPADS AND DIAPERS) ×2 IMPLANT
YANKAUER SUCT BULB TIP NO VENT (SUCTIONS) ×2 IMPLANT

## 2022-03-29 NOTE — Anesthesia Procedure Notes (Signed)
Procedure Name: Intubation ?Date/Time: 03/29/2022 11:09 AM ?Performed by: Georgia Duff, CRNA ?Pre-anesthesia Checklist: Patient identified, Emergency Drugs available, Suction available and Patient being monitored ?Patient Re-evaluated:Patient Re-evaluated prior to induction ?Oxygen Delivery Method: Circle System Utilized ?Preoxygenation: Pre-oxygenation with 100% oxygen ?Induction Type: IV induction ?Ventilation: Mask ventilation without difficulty ?Laryngoscope Size: Sabra Heck and 3 ?Grade View: Grade I ?Tube type: Oral ?Tube size: 7.0 mm ?Number of attempts: 1 ?Airway Equipment and Method: Stylet and Oral airway ?Placement Confirmation: ETT inserted through vocal cords under direct vision, positive ETCO2 and breath sounds checked- equal and bilateral ?Secured at: 21 cm ?Tube secured with: Tape ?Dental Injury: Teeth and Oropharynx as per pre-operative assessment  ? ? ? ? ?

## 2022-03-29 NOTE — ED Notes (Signed)
[place] reviewed by this RN ?

## 2022-03-29 NOTE — ED Notes (Signed)
Report given to [facility] with [facility] ?

## 2022-03-29 NOTE — Transfer of Care (Signed)
Immediate Anesthesia Transfer of Care Note ? ?Patient: Christina Mercado ? ?Procedure(s) Performed: OPEN REDUCTION INTERNAL FIXATION (ORIF) LEFT ELBOW/OLECRANON FRACTURE (Left: Elbow) ? ?Patient Location: PACU ? ?Anesthesia Type:General ? ?Level of Consciousness: drowsy ? ?Airway & Oxygen Therapy: Patient Spontanous Breathing and Patient connected to face mask oxygen ? ?Post-op Assessment: Report given to RN and Post -op Vital signs reviewed and stable ? ?Post vital signs: Reviewed and stable ? ?Last Vitals: see PACU VS  ?Vitals Value Taken Time  ?BP    ?Temp    ?Pulse    ?Resp    ?SpO2    ? ? ?Last Pain:  ?Vitals:  ? 03/29/22 0942  ?TempSrc: Oral  ?PainSc:   ?   ? ?  ? ?Complications: No notable events documented. ?

## 2022-03-29 NOTE — Op Note (Signed)
Orthopaedic Surgery Operative Note (CSN: [id number] ) ?Date of Surgery: 03/29/2022  ?Admit Date: 03/29/2022  ? ?Diagnoses: ?Pre-Op Diagnoses: ?Left extra-articular distal humerus fracture ? ?Post-Op Diagnosis: ?Same ? ?Procedures: ?CPT 37628-BTDV reduction internal fixation of left supracondylar humerus fracture ? ?Surgeons : ?Primary: Shona Needles, MD ? ?Assistant: Patrecia Pace, PA-C ? ?Location: OR 7 ? ?Anesthesia: General with regional  ? ?Antibiotics: Ceftriaxone 2 gm scheduled  ? ?Tourniquet time: None used   ? ?Estimated Blood Loss: 50 mL ? ?Complications: None  ? ?Specimens:None  ? ?Implants: ?Implant Name Type Inv. Item Serial No. Manufacturer Lot No. LRB No. Used Action  ?PLATE HUM EVOS 6H L 7.6H60 - [id number] Plate PLATE HUM EVOS 6H L 4.8N46  [employer]  Left 1 Implanted  ?SCREW CORTEX 3.5X26 - [id number] Screw SCREW CORTEX 3.5X26  [employer]  Left 1 Implanted  ?SCREW LOCK ST EVOS 2.7X22 - [id number] Screw SCREW LOCK ST EVOS 2.7X22  [employer]  Left 1 Implanted  ?SCREW LOCK ST EVOS 2.7X24 - [id number] Screw SCREW LOCK ST EVOS 2.7X24  [employer]  Left 2 Implanted  ?PLATE HUM EVOS 3H L 0.1B51 - [id number] Plate PLATE HUM EVOS 3H L 2.4M35  [employer]  Left 1 Implanted  ?SCREW CORT VA EVOS 2.7X28 - [id number] Screw SCREW CORT VA EVOS 2.7X28  [employer]  Left 1 Implanted  ?SCREW CORT 3.5X22 ST EVOS - [id number] Screw SCREW CORT 3.5X22 ST EVOS  [employer]  Left 3 Implanted  ?SCREW LOCK 2.7X30 - [id number] Screw SCREW LOCK 2.7X30  [employer]  Left 1 Implanted  ?SCREW LOCK ST EVOS 2.7X36 - [id number] Screw SCREW LOCK ST EVOS 2.7X36  [employer]  Left 2 Implanted  ?SCREW CORTEX 3.5X24MM - [id number] Screw SCREW CORTEX 3.5X24MM  [employer]  Left 1 Implanted  ?  ? ?Indications for Surgery: ?86 year old female who sustained a ground-level fall and had  a left extra-articular distal humerus fracture.  Due to the unstable nature of her injury I recommend proceeding with open reduction internal fixation.  Risks and benefits were discussed with the patient and her son.  Risks included but not limited to bleeding, infection, malunion, nonunion, hardware failure, 100 rotation, nerve or blood vessel injury, stiffness, posttraumatic arthritis, even possible anesthetic complications.  They agreed to proceed with surgery and consent was obtained. ? ?Operative Findings: ?Open reduction internal fixation of left extra-articular distal humerus fracture using [employer] EVOS posterior lateral distal humerus plate and direct medial distal humerus plate ? ?Procedure: ?The patient was identified in the preoperative holding area. Consent was confirmed with the patient and their family and all questions were answered. The operative extremity was marked after confirmation with the patient. she was then brought back to the operating room by our anesthesia colleagues.  She was placed under general anesthetic and carefully transferred over to radiolucent flat top table.  She was placed in the lateral decubitus position with her left side up.  An axillary roll was placed to protect her neurovascular structures in her upper extremity.  A beanbag was deflated and all bony prominences were well-padded.  Left upper extremity was prepped and draped in usual sterile fashion.  A timeout was performed to verify the patient, the procedure, and the extremity.  Preoperative antibiotics were dosed. ? ?Fluoroscopic imaging showed the unstable nature of her injury.  Direct posterior approach to the distal  humerus was carried down through skin and subcutaneous tissue.  I incised through the triceps fascia and exposed the posterior aspect of the lateral condyle and distal humerus.  I then identified the ulnar nerve at the intermuscular septum medially and dissected this out and carefully protected  it throughout the case.  I dissected it into the cubital tunnel.  I then cleared out the hematoma of the fracture and I used a reduction tenaculum to anatomically hold it in position.  I then provisionally held it with 1.6 mm K wires. ? ?I placed a posterior lateral plate held provisionally with K wires confirmed positioning with fluoroscopy and then drilled and placed a nonlocking screw into the metaphysis.  I then placed 3 locking screws into the capitellum and then proceeded to use a direct medial plate and held it provisionally with K wires.  I placed a nonlocking 2.8 millimeter screw to bring the plate flush to bone.  I then proceeded to place locking screws into the distal articular segment.  I then proceeded to place nonlocking screws into the humeral shaft both medially and posterior laterally.  This completed my construct. ? ?Final fluoroscopic imaging was obtained.  The incision was copiously irrigated.  A gram of vancomycin powder was placed into the incision.  Layered closure of 0 Vicryl, 2-0 Vicryl and 3-0 nylon was used to close the skin.  Sterile dressing was applied.  Patient was then awoke from anesthesia and taken to the PACU in stable condition. ? ?Post Op Plan/Instructions: ?Patient will be nonweightbearing to the left upper extremity.  She will have unrestricted range of motion of the left elbow.  I am okay with the use of the fistula for dialysis after 48 hours.  DVT prophylaxis will be at the discretion of the primary team.  None is needed for the upper extremity injury.  We will have her mobilize with with physical and Occupational Therapy. ? ?I was present and performed the entire surgery. ? ?Patrecia Pace, PA-C did assist me throughout the case. An assistant was necessary given the difficulty in approach, maintenance of reduction and ability to instrument the fracture. ? ? ?Katha Hamming, MD ?[facility]  ?

## 2022-03-29 NOTE — Progress Notes (Signed)
Patient admitted this morning by my partner ?Patient was transferred from [facility] for left elbow fracture she is a dialysis patient ?She has history of type 2 diabetes hypertension ?She is now status post open reduction internal fixation of left supracondylar humerus fracture ?Nephrology informed of patient's admission ?

## 2022-03-29 NOTE — Progress Notes (Signed)
Advised of pt's admission this morning in progression. [place] to confirm pt's schedule/times. Spoke to [employer] at clinic who confirms pt receives out-pt HD on Tuesday and Saturday. Pt arrives at 11:35 for 11:50 chair time. Will advise clinic of pt's d/c date once known. Will assist as needed.  ? ?Melven Sartorius ?Renal Navigator ?[phone] ?

## 2022-03-29 NOTE — Evaluation (Signed)
Physical Therapy Evaluation ?Patient Details ?Name: Christina Mercado ?MRN: [id number] ?DOB: [birth year] ?Today's Date: 03/29/2022 ? ?History of Present Illness ? 86 y/o female presented to ED on 03/27/22 following fall. Sustained L displaced supracondylar fx. S/p permacath in R internal jugular vein on 4/18. S/p ORIF L humerus fx on 4/19. PMH: HTN, Diabetes, ESRD  ?Clinical Impression ? Patient admitted with the above. Patient presents with generalized weakness, impaired balance, and decreased activity tolerance. Patient lethargic on arrival but able to encourage patient to participate. Patient required modA+2 for bed mobility and min guard for lateral scoot transfer on EOB. Patient declined standing or ambulation this date. Educated patient on sling wear and WB status and required cues and repetition to maintain. Patient will benefit from skilled PT services during acute stay to address listed deficits. Recommend SNF at discharge to maximize functional independence prior to returning to [facility].    ?   ? ?Recommendations for follow up therapy are one component of a multi-disciplinary discharge planning process, led by the attending physician.  Recommendations may be updated based on patient status, additional functional criteria and insurance authorization. ? ?Follow Up Recommendations Skilled nursing-short term rehab (<3 hours/day) ? ?  ?Assistance Recommended at Discharge Frequent or constant Supervision/Assistance  ?Patient can return home with the following ? A little help with walking and/or transfers;A little help with bathing/dressing/bathroom ? ?  ?Equipment Recommendations Other (comment) (TBD)  ?Recommendations for Other Services ?    ?  ?Functional Status Assessment Patient has had a recent decline in their functional status and demonstrates the ability to make significant improvements in function in a reasonable and predictable amount of time.  ? ?  ?Precautions / Restrictions Precautions ?Precautions:  Fall ?Required Braces or Orthoses: Sling ?Restrictions ?Weight Bearing Restrictions: Yes ?LUE Weight Bearing: Non weight bearing  ? ?  ? ?Mobility ? Bed Mobility ?Overal bed mobility: Needs Assistance ?Bed Mobility: Supine to Sit, Sit to Supine ?  ?  ?Supine to sit: Mod assist, +2 for safety/equipment, +2 for physical assistance ?Sit to supine: Mod assist ?  ?General bed mobility comments: modA+2 for initial supine>sit with assist for LE management and trunk elevation. ModA to return to supine with assist for LE management ?  ? ?Transfers ?Overall transfer level: Needs assistance ?Equipment used: None ?Transfers: Bed to chair/wheelchair/BSC ?  ?  ?  ?  ?  ? Lateral/Scoot Transfers: Min guard ?General transfer comment: able to lateral scoot on EOB to get closer to [facility] with min guard. Patient declined standing this sessino ?  ? ?Ambulation/Gait ?  ?  ?  ?  ?  ?  ?  ?  ? ?Stairs ?  ?  ?  ?  ?  ? ?Wheelchair Mobility ?  ? ?Modified Rankin (Stroke Patients Only) ?  ? ?  ? ?Balance Overall balance assessment: Needs assistance ?Sitting-balance support: No upper extremity supported, Feet supported ?Sitting balance-Leahy Scale: Fair ?  ?  ?  ?  ?  ?  ?  ?  ?  ?  ?  ?  ?  ?  ?  ?  ?   ? ? ? ?Pertinent Vitals/Pain Pain Assessment ?Pain Assessment: Faces ?Faces Pain Scale: Hurts little more ?Pain Location: L arm ?Pain Descriptors / Indicators: Grimacing ?Pain Intervention(s): Monitored during session  ? ? ?Home Living Family/patient expects to be discharged to:: Private residence ?Living Arrangements: Alone ?  ?Type of Home: Independent living facility ?Home Access: Level entry ?  ?  ?  ?  Home Layout: One level ?Home Equipment: Conservation officer, nature (2 wheels) ?   ?  ?Prior Function Prior Level of Function : Independent/Modified Independent ?  ?  ?  ?  ?  ?  ?Mobility Comments: using RW due to hx of hip fx and leg length discrepancy ?  ?  ? ? ?Hand Dominance  ?   ? ?  ?Extremity/Trunk Assessment  ? Upper Extremity Assessment ?Upper  Extremity Assessment: Defer to OT evaluation ?  ? ?Lower Extremity Assessment ?Lower Extremity Assessment: Generalized weakness ?  ? ?Cervical / Trunk Assessment ?Cervical / Trunk Assessment: Kyphotic  ?Communication  ? Communication: No difficulties  ?Cognition Arousal/Alertness: Lethargic, Suspect due to medications ?Behavior During Therapy: [facility] for tasks assessed/performed ?Overall Cognitive Status: No family/caregiver present to determine baseline cognitive functioning ?  ?  ?  ?  ?  ?  ?  ?  ?  ?  ?  ?  ?  ?  ?  ?  ?General Comments: seems WFL but difficult to assess due to lethargy and medications ?  ?  ? ?  ?General Comments   ? ?  ?Exercises    ? ?Assessment/Plan  ?  ?PT Assessment Patient needs continued PT services  ?PT Problem List Decreased strength;Decreased activity tolerance;Decreased balance;Decreased mobility;Decreased safety awareness;Decreased knowledge of precautions ? ?   ?  ?PT Treatment Interventions Gait training;DME instruction;Functional mobility training;Therapeutic activities;Therapeutic exercise;Balance training;Patient/family education   ? ?PT Goals (Current goals can be found in the Care Plan section)  ?Acute Rehab PT Goals ?Patient Stated Goal: to go to rehab ?PT Goal Formulation: With patient ?Time For Goal Achievement: 04/12/22 ?Potential to Achieve Goals: Good ? ?  ?Frequency Min 2X/week ?  ? ? ?Co-evaluation   ?  ?  ?  ?  ? ? ?  ?AM-PAC PT "6 Clicks" Mobility  ?Outcome Measure Help needed turning from your back to your side while in a flat bed without using bedrails?: Total ?Help needed moving from lying on your back to sitting on the side of a flat bed without using bedrails?: Total ?Help needed moving to and from a bed to a chair (including a wheelchair)?: A Little ?Help needed standing up from a chair using your arms (e.g., wheelchair or bedside chair)?: Total ?Help needed to walk in hospital room?: Total ?Help needed climbing 3-5 steps with a railing? : Total ?6 Click Score:  8 ? ?  ?End of Session Equipment Utilized During Treatment: Other (comment) (sling) ?Activity Tolerance: Patient limited by lethargy ?Patient left: in bed;with call bell/phone within reach;with bed alarm set ?Nurse Communication: Mobility status ?PT Visit Diagnosis: Unsteadiness on feet (R26.81);Muscle weakness (generalized) (M62.81);History of falling (Z91.81);Other abnormalities of gait and mobility (R26.89) ?  ? ?Time: 4944-9675 ?PT Time Calculation (min) (ACUTE ONLY): 19 min ? ? ?Charges:   PT Evaluation ?$PT Eval Moderate Complexity: 1 Mod ?  ?  ?   ? ? ?Kenia Teagarden A. Gilford Rile, PT, DPT ?Acute Rehabilitation Services ?Pager [phone] ?Office [phone] ? ? ?Jahara Dail A Aariyana Manz ?03/29/2022, 5:14 PM ? ?

## 2022-03-29 NOTE — Anesthesia Procedure Notes (Signed)
Anesthesia Regional Block: Supraclavicular block  ? ?Pre-Anesthetic Checklist: , timeout performed,  Correct Patient, Correct Site, Correct Laterality,  Correct Procedure, Correct Position, site marked,  Risks and benefits discussed,  Surgical consent,  Pre-op evaluation,  At surgeon's request and post-op pain management ? ?Laterality: Upper and Left ? ?Prep: chloraprep     ?  ?Needles:  ?Injection technique: Single-shot ? ?Needle Type: Stimiplex   ? ? ? ? ? ? ? ?Additional Needles: ? ? ?Procedures:,,,, ultrasound used (permanent image in chart),,    ?Narrative:  ?Start time: 03/29/2022 10:00 AM ?End time: 03/29/2022 10:20 AM ?Injection made incrementally with aspirations every 5 mL. ? ?Performed by: Personally  ?Anesthesiologist: [name], MD ? ?Additional Notes: ?BP cuff, SpO2 and EKG monitors applied. Sedation begun. Nerve location verified with ultrasound. Anesthetic injected incrementally, slowly, and after neg aspirations under direct u/s guidance. Good perineural spread. Tolerated well. ? ? ? ? ?

## 2022-03-29 NOTE — Anesthesia Preprocedure Evaluation (Addendum)
Anesthesia Evaluation  ?Patient identified by MRN, date of birth, ID band ?Patient awake ? ? ? ?Reviewed: ?Allergy & Precautions, NPO status , Patient's Chart, lab work & pertinent test results ? ?Airway ?Mallampati: II ? ?TM Distance: >3 FB ?Neck ROM: Full ? ? ? Dental ? ?(+) Dental Advisory Given, Teeth Intact ?  ?Pulmonary ?neg pulmonary ROS,  ?  ?Pulmonary exam normal ?breath sounds clear to auscultation ? ? ? ? ? ? Cardiovascular ?hypertension, Pt. on medications ?Normal cardiovascular exam ?Rhythm:Regular Rate:Normal ? ? ?  ?Neuro/Psych ?PSYCHIATRIC DISORDERS Anxiety Depression negative neurological ROS ?   ? GI/Hepatic ?negative GI ROS, Neg liver ROS,   ?Endo/Other  ?diabetesHypothyroidism  ? Renal/GU ?Dialysis and ESRFRenal disease  ? ?  ?Musculoskeletal ? ?(+) Arthritis ,  ? Abdominal ?  ?Peds ? Hematology ? ?(+) Blood dyscrasia, anemia ,   ?Anesthesia Other Findings ? ? Reproductive/Obstetrics ? ?  ? ? ? ? ? ? ? ? ? ? ? ? ? ?  ?  ? ? ? ? ? ? ? ?Anesthesia Physical ?Anesthesia Plan ? ?ASA: 3 ? ?Anesthesia Plan: General  ? ?Post-op Pain Management: Regional block* and Tylenol PO (pre-op)*  ? ?Induction: Intravenous ? ?PONV Risk Score and Plan: 3 and Ondansetron, Dexamethasone and Treatment may vary due to age or medical condition ? ?Airway Management Planned: Oral ETT ? ?Additional Equipment:  ? ?Intra-op Plan:  ? ?Post-operative Plan: Extubation in OR ? ?Informed Consent: I have reviewed the patients History and Physical, chart, labs and discussed the procedure including the risks, benefits and alternatives for the proposed anesthesia with the patient or authorized representative who has indicated his/her understanding and acceptance.  ? ? ? ?Dental advisory given ? ?Plan Discussed with: CRNA ? ?Anesthesia Plan Comments:   ? ? ? ? ? ?Anesthesia Quick Evaluation ? ?

## 2022-03-29 NOTE — Consult Note (Signed)
Orthopaedic Trauma Service (OTS) Consult  ? ?Patient ID: ?Christina Mercado ?MRN: [id number] ?DOB/AGE: Nov 19, 1936 86 y.o. ? ?Reason for Consult:Left distal humerus fracture ?Referring Physician: Dr. Gean Birchwood, MD [facility] ? ?HPI: Christina Mercado is an 86 y.o. female who is being seen in consultation at the request of Dr. Hal Hope for evaluation of left distal humerus fracture.  Patient has a history of end-stage renal disease on dialysis rheumatoid arthritis, hypertension and type 2 diabetes.  She tripped and fell and sustained a supracondylar distal humerus fracture.  She presented to [facility] emergency room where x-rays showed this and it was felt to be outside the scope of practice for the orthopedic providers at that hospital so it was recommended treatment by an orthopedic traumatologist.  She was subsequently transferred over here overnight. ? ?Patient received hemodialysis yesterday.  She has fistula in that arm and there were not able to access this.  Patient is confused this morning, though she is not able to provide any significant history.  I talked to her son over the phone who provided some more history.  She was found to have a UTI and was given ceftriaxone yesterday and this morning.  Her white blood cell count has improved and she has been afebrile over the last 12 to 24 hours.  Currently complaining of significant pain to her left elbow with any movement.  Denies any significant numbness or tingling a full assessment due to confusion. ? ?Past Medical History:  ?Diagnosis Date  ? Arthritis   ? Diabetes mellitus without complication ([place])   ? Hypertension   ? Kidney failure   ? stage 3  ? Proteinuria   ? ? ?Past Surgical History:  ?Procedure Laterality Date  ? DIALYSIS/PERMA CATHETER INSERTION N/A 05/30/2021  ? Procedure: DIALYSIS/PERMA CATHETER INSERTION;  Surgeon: Algernon Huxley, MD;  Location: [facility];  Service: Cardiovascular;  Laterality: N/A;  ? DIALYSIS/PERMA  CATHETER INSERTION N/A 03/28/2022  ? Procedure: DIALYSIS/PERMA CATHETER INSERTION;  Surgeon: Evaristo Bury, MD;  Location: [facility];  Service: Cardiovascular;  Laterality: N/A;  ? DIALYSIS/PERMA CATHETER REMOVAL N/A 08/02/2021  ? Procedure: DIALYSIS/PERMA CATHETER REMOVAL;  Surgeon: Katha Cabal, MD;  Location: [facility];  Service: Cardiovascular;  Laterality: N/A;  ? EYE SURGERY    ? RENAL BIOPSY    ? TOTAL HIP ARTHROPLASTY Right   ? ? ?History reviewed. No pertinent family history. ? ?Social History:  reports that she has never smoked. She has never used smokeless tobacco. She reports that she does not drink alcohol and does not use drugs. ? ?Allergies:  ?Allergies  ?Allergen Reactions  ? Byetta 10 Mcg Pen [Exenatide] Nausea And Vomiting and Other (See Comments)  ?  Weight loss ?  ? Ibuprofen Swelling  ?  Arm swelling - not sure if it was the ibuprofen but she uses tylenol instead. Is able to take Mobic so this is not a class allergy.  ? Lisinopril Rash  ? Aspirin Other (See Comments)  ?  Unknown ?  ? ? ?Medications:  ?No current facility-administered medications on file prior to encounter.  ? ?Current Outpatient Medications on File Prior to Encounter  ?Medication Sig Dispense Refill  ? acetaminophen (TYLENOL) 500 MG tablet Take 1,000 mg by mouth every 8 (eight) hours as needed for moderate pain.    ? acyclovir (ZOVIRAX) 200 MG capsule Take 200 mg by mouth daily.    ? amLODipine (NORVASC) 2.5 MG tablet Take 2.5 mg by  mouth daily.    ? Ascorbic Acid (VITAMIN C) 1000 MG tablet Take 1,000 mg by mouth daily.    ? atorvastatin (LIPITOR) 40 MG tablet Take 40 mg by mouth daily at 6 PM.    ? calcium gluconate 500 MG tablet Take 1 tablet by mouth daily. (Patient not taking: Reported on 03/28/2022)    ? Cholecalciferol (VITAMIN D3) 1000 units CAPS Take 2,000 Units by mouth every morning.    ? dexamethasone (DECADRON) 4 MG tablet Take 8 mg by mouth See admin instructions. Once a week, before  [place] treatment.    ? docusate sodium (COLACE) 100 MG capsule Take 1 capsule (100 mg total) by mouth 2 (two) times daily. (Patient taking differently: Take 100 mg by mouth 2 (two) times daily as needed for moderate constipation.) 10 capsule 0  ? DULoxetine (CYMBALTA) 60 MG capsule Take 60 mg by mouth daily.    ? ferrous sulfate 325 (65 FE) MG tablet Take 325 mg by mouth daily with breakfast.    ? hydrOXYzine (ATARAX/VISTARIL) 10 MG tablet Take 10 mg by mouth 3 (three) times daily as needed for itching.    ? levothyroxine (SYNTHROID) 150 MCG tablet Take 150 mcg by mouth daily before breakfast.    ? lidocaine-prilocaine (EMLA) cream Apply 1 application topically as needed (dialysis access).    ? NINLARO 3 MG capsule Take 3 mg by mouth once a week. Once a week for 3 weeks in a 28 day cycle    ? ondansetron (ZOFRAN) 8 MG tablet Take 8 mg by mouth every 8 (eight) hours as needed for nausea or vomiting.    ? Oxycodone HCl 10 MG TABS Take 5-10 mg by mouth 5 (five) times daily.  0  ? polyethylene glycol (MIRALAX / GLYCOLAX) packet Take 17 g by mouth daily as needed for moderate constipation.    ? vitamin B-12 (CYANOCOBALAMIN) 1000 MCG tablet Take 1,000 mcg by mouth every morning.    ?  ? ?ROS: Unable to obtain due to confusion ? ?Exam: ?Blood pressure (!) 169/78, pulse (!) 110, temperature 98.2 ?F (36.8 ?C), temperature source Oral, resp. rate 18, height '5\' 5"'$  (1.651 m), SpO2 98 %. ?General: No acute distress ?Orientation: Awake and alert. ?Mood and Affect: Not fully cooperative and has appropriate affect. ?Gait: Unable to assess due to her fracture ?Coordination and balance: Within normal limits ? ?Injured Extremity (CV, lymph, sensation, reflexes): Left upper extremity: Splint is in place is clean dry and intact.  Compartments are soft compressible.  She is able to tolerate no motion of the shoulder.  She is able to wiggle her fingers and has intact motor and sensory function to median, radial and ulnar nerve  distribution.  She has warm well-perfused hand with brisk cap refill ? ?Right Upper extremity: Skin without lesions. No tenderness to palpation. Full painless ROM, full strength in each muscle groups without evidence of instability. ? ? ?Medical Decision Making: ?Data: ?Imaging: X-rays of the left elbow are reviewed which shows neck to articular supracondylar distal humerus fracture with significant displacement. ? ?Labs:  ?Results for orders placed or performed during the hospital encounter of 03/29/22 (from the past 24 hour(s))  ?Basic metabolic panel     Status: Abnormal  ? Collection Time: 03/29/22  5:47 AM  ?Result Value Ref Range  ? Sodium 137 135 - 145 mmol/L  ? Potassium 4.9 3.5 - 5.1 mmol/L  ? Chloride 99 98 - 111 mmol/L  ? CO2 29 22 - 32 mmol/L  ?  Glucose, Bld 118 (H) 70 - 99 mg/dL  ? BUN 22 8 - 23 mg/dL  ? Creatinine, Ser 4.10 (H) 0.44 - 1.00 mg/dL  ? Calcium 9.3 8.9 - 10.3 mg/dL  ? GFR, Estimated 10 (L) >60 mL/min  ? Anion gap 9 5 - 15  ?CBC with Differential/Platelet     Status: Abnormal  ? Collection Time: 03/29/22  5:47 AM  ?Result Value Ref Range  ? WBC 9.3 4.0 - 10.5 K/uL  ? RBC 3.44 (L) 3.87 - 5.11 MIL/uL  ? Hemoglobin 10.8 (L) 12.0 - 15.0 g/dL  ? HCT 33.2 (L) 36.0 - 46.0 %  ? MCV 96.5 80.0 - 100.0 fL  ? MCH 31.4 26.0 - 34.0 pg  ? MCHC 32.5 30.0 - 36.0 g/dL  ? RDW 15.2 11.5 - 15.5 %  ? Platelets 195 150 - 400 K/uL  ? nRBC 0.0 0.0 - 0.2 %  ? Neutrophils Relative % 61 %  ? Neutro Abs 5.7 1.7 - 7.7 K/uL  ? Lymphocytes Relative 22 %  ? Lymphs Abs 2.0 0.7 - 4.0 K/uL  ? Monocytes Relative 15 %  ? Monocytes Absolute 1.4 (H) 0.1 - 1.0 K/uL  ? Eosinophils Relative 1 %  ? Eosinophils Absolute 0.1 0.0 - 0.5 K/uL  ? Basophils Relative 1 %  ? Basophils Absolute 0.1 0.0 - 0.1 K/uL  ? Immature Granulocytes 0 %  ? Abs Immature Granulocytes 0.04 0.00 - 0.07 K/uL  ?Hepatic function panel     Status: Abnormal  ? Collection Time: 03/29/22  5:47 AM  ?Result Value Ref Range  ? Total Protein 5.9 (L) 6.5 - 8.1 g/dL  ?  Albumin 3.0 (L) 3.5 - 5.0 g/dL  ? AST 18 15 - 41 U/L  ? ALT 12 0 - 44 U/L  ? Alkaline Phosphatase 65 38 - 126 U/L  ? Total Bilirubin 0.6 0.3 - 1.2 mg/dL  ? Bilirubin, Direct 0.2 0.0 - 0.2 mg/dL  ? Indirect Bilirubin 0.4

## 2022-03-29 NOTE — Interval H&P Note (Signed)
History and Physical Interval Note: ? ?03/29/2022 ?10:10 AM ? ?Christina Mercado  has presented today for surgery, with the diagnosis of Left Elbow Fracture.  The various methods of treatment have been discussed with the patient and family. After consideration of risks, benefits and other options for treatment, the patient has consented to  Procedure(s): ?OPEN REDUCTION INTERNAL FIXATION (ORIF) LEFT ELBOW/OLECRANON FRACTURE (Left) as a surgical intervention.  The patient's history has been reviewed, patient examined, no change in status, stable for surgery.  I have reviewed the patient's chart and labs.  Questions were answered to the patient's satisfaction.   ? ? ?Lennette Bihari P Tyechia Allmendinger ? ? ?

## 2022-03-29 NOTE — Evaluation (Signed)
Occupational Therapy Evaluation ?Patient Details ?Name: Christina Mercado ?MRN: [id number] ?DOB: 07-21-1936 ?Today's Date: 03/29/2022 ? ? ?History of Present Illness 86 y/o female presented to ED on 03/27/22 following fall. Sustained L displaced supracondylar fx. S/p permacath in R internal jugular vein on 4/18. S/p ORIF L humerus fx on 4/19. PMH: HTN, Diabetes, ESRD  ? ?Clinical Impression ?  ?Pt admitted for concerns listed above. PTA pt reported that she was independent with all ADL's and IADL's. At this time, due to her elbow fracture, pt is to remain NWB in her LUE. During the session, pt is requiring mod cuing to maintain NWB, as she continues to be confused, with decreased recall. Pt also lethargic, refusing increased mobility at this time. Recommending SNF due to increased need for assist with ADL's, however if pt is able to have 24/7 assist at home, she would be able to return home with max assist and HH. OT will follow acutely.  ?   ? ?Recommendations for follow up therapy are one component of a multi-disciplinary discharge planning process, led by the attending physician.  Recommendations may be updated based on patient status, additional functional criteria and insurance authorization.  ? ?Follow Up Recommendations ? Skilled nursing-short term rehab (<3 hours/day)  ?  ?Assistance Recommended at Discharge Frequent or constant Supervision/Assistance  ?Patient can return home with the following A lot of help with walking and/or transfers;A lot of help with bathing/dressing/bathroom;Assistance with cooking/housework;Direct supervision/assist for medications management;Assist for transportation;Help with stairs or ramp for entrance ? ?  ?Functional Status Assessment ? Patient has had a recent decline in their functional status and demonstrates the ability to make significant improvements in function in a reasonable and predictable amount of time.  ?Equipment Recommendations ? Other (comment) (TBD)  ?   ?Recommendations for Other Services   ? ? ?  ?Precautions / Restrictions Precautions ?Precautions: Fall ?Required Braces or Orthoses: Sling ?Restrictions ?Weight Bearing Restrictions: Yes ?LUE Weight Bearing: Non weight bearing  ? ?  ? ?Mobility Bed Mobility ?Overal bed mobility: Needs Assistance ?Bed Mobility: Supine to Sit, Sit to Supine ?  ?  ?Supine to sit: Mod assist, +2 for safety/equipment, +2 for physical assistance ?Sit to supine: Mod assist ?  ?General bed mobility comments: modA+2 for initial supine>sit with assist for LE management and trunk elevation. ModA to return to supine with assist for LE management ?  ? ?Transfers ?Overall transfer level: Needs assistance ?Equipment used: None ?Transfers: Bed to chair/wheelchair/BSC ?  ?  ?  ?  ?  ? Lateral/Scoot Transfers: Min guard ?General transfer comment: able to lateral scoot on EOB to get closer to [facility] with min guard. Patient declined standing this sessino ?  ? ?  ?Balance Overall balance assessment: Needs assistance ?Sitting-balance support: No upper extremity supported, Feet supported ?Sitting balance-Leahy Scale: Fair ?  ?  ?  ?  ?  ?  ?  ?  ?  ?  ?  ?  ?  ?  ?  ?  ?   ? ?ADL either performed or assessed with clinical judgement  ? ?ADL Overall ADL's : Needs assistance/impaired ?Eating/Feeding: Set up;Sitting ?  ?Grooming: Set up;Sitting ?  ?Upper Body Bathing: Minimal assistance;Sitting ?  ?Lower Body Bathing: Moderate assistance;Sitting/lateral leans;Sit to/from stand ?  ?Upper Body Dressing : Minimal assistance;Sitting ?  ?Lower Body Dressing: Moderate assistance;Sitting/lateral leans;Sit to/from stand ?  ?Toilet Transfer: Moderate assistance;Stand-pivot ?  ?Toileting- Clothing Manipulation and Hygiene: Moderate assistance;Sitting/lateral lean;Sit to/from stand ?  ?  ?  ?  ?  General ADL Comments: Pt limited by fatigue and pain  ? ? ? ?Vision Baseline Vision/History: 1 Wears glasses ?Ability to See in Adequate Light: 0 Adequate ?Patient Visual Report:  No change from baseline ?Vision Assessment?: No apparent visual deficits  ?   ?Perception   ?  ?Praxis   ?  ? ?Pertinent Vitals/Pain Pain Assessment ?Pain Assessment: Faces ?Faces Pain Scale: Hurts little more ?Pain Location: L arm ?Pain Descriptors / Indicators: Grimacing ?Pain Intervention(s): Monitored during session  ? ? ? ?Hand Dominance Right ?  ?Extremity/Trunk Assessment Upper Extremity Assessment ?Upper Extremity Assessment: Generalized weakness;LUE deficits/detail ?LUE Deficits / Details: Elbow fracture, no ROM limits, no WB ?LUE Sensation: WNL ?LUE Coordination: decreased gross motor ?  ?Lower Extremity Assessment ?Lower Extremity Assessment: Defer to PT evaluation ?  ?Cervical / Trunk Assessment ?Cervical / Trunk Assessment: Kyphotic ?  ?Communication Communication ?Communication: No difficulties ?  ?Cognition Arousal/Alertness: Lethargic, Suspect due to medications ?Behavior During Therapy: [facility] for tasks assessed/performed ?Overall Cognitive Status: No family/caregiver present to determine baseline cognitive functioning ?  ?  ?  ?  ?  ?  ?  ?  ?  ?  ?  ?  ?  ?  ?  ?  ?General Comments: seems WFL but difficult to assess due to lethargy and medications ?  ?  ?General Comments  VSS on RA ? ?  ?Exercises   ?  ?Shoulder Instructions    ? ? ?Home Living Family/patient expects to be discharged to:: Private residence ?Living Arrangements: Alone ?  ?Type of Home: Independent living facility ?Home Access: Level entry ?  ?  ?Home Layout: One level ?  ?  ?Bathroom Shower/Tub: Walk-in shower ?  ?Bathroom Toilet: Handicapped height ?  ?  ?Home Equipment: Conservation officer, nature (2 wheels);Shower seat - built in ?  ?  ?  ? ?  ?Prior Functioning/Environment Prior Level of Function : Independent/Modified Independent ?  ?  ?  ?  ?  ?  ?Mobility Comments: using RW due to hx of hip fx and leg length discrepancy ?ADLs Comments: Indep ?  ? ?  ?  ?OT Problem List: Decreased strength;Decreased range of motion;Decreased activity  tolerance;Impaired balance (sitting and/or standing);Decreased coordination;Decreased cognition;Decreased safety awareness;Impaired UE functional use;Pain ?  ?   ?OT Treatment/Interventions:    ?  ?OT Goals(Current goals can be found in the care plan section) Acute Rehab OT Goals ?Patient Stated Goal: To go home ?OT Goal Formulation: With patient ?Time For Goal Achievement: 04/12/22 ?Potential to Achieve Goals: Good ?ADL Goals ?Pt Will Perform Grooming: with modified independence;with adaptive equipment;standing ?Pt Will Transfer to Toilet: with modified independence;ambulating ?Pt Will Perform Toileting - Clothing Manipulation and hygiene: with modified independence;with adaptive equipment;sit to/from stand;sitting/lateral leans ?Additional ADL Goal #1: Pt will maintain NWB through LUE 100% of the time, with no verbal cues.  ?OT Frequency:   ?  ? ?Co-evaluation   ?  ?  ?  ?  ? ?  ?AM-PAC OT "6 Clicks" Daily Activity     ?Outcome Measure Help from another person eating meals?: A Little ?Help from another person taking care of personal grooming?: A Little ?Help from another person toileting, which includes using toliet, bedpan, or urinal?: A Lot ?Help from another person bathing (including washing, rinsing, drying)?: A Lot ?Help from another person to put on and taking off regular upper body clothing?: A Little ?Help from another person to put on and taking off regular lower body clothing?: A Lot ?6 Click  Score: 15 ?  ?End of Session Equipment Utilized During Treatment: Other (comment) (sling) ?Nurse Communication: Mobility status ? ?Activity Tolerance: Patient limited by lethargy ?Patient left: in bed;with call bell/phone within reach;with bed alarm set ? ?OT Visit Diagnosis: Unsteadiness on feet (R26.81);Other abnormalities of gait and mobility (R26.89);Muscle weakness (generalized) (M62.81)  ?              ?Time: 3762-8315 ?OT Time Calculation (min): 21 min ?Charges:  OT General Charges ?$OT Visit: 1 Visit ?OT  Evaluation ?$OT Eval Moderate Complexity: 1 Mod ? ?Halsey Persaud H., OTR/L ?Acute Rehabilitation ? ?Jerzy Roepke Elane Yolanda Bonine ?03/29/2022, 7:37 PM ?

## 2022-03-29 NOTE — H&P (Signed)
?History and Physical  ? ? ?Christina Mercado QBH:[id number] DOB: 16-May-1936 DOA: 03/29/2022 ? ?PCP: Myrtie Soman, MD  ?Patient coming from: Patient was transferred from [facility] for left elbow fracture. ? ?Chief Complaint: Fall. ? ?HPI: Christina Mercado is a 86 y.o. female with history of ESRD on hemodialysis, diabetes mellitus, hypertension had a fall and sustained injury to her left elbow.  Patient was brought to the ER at [facility]. ? ?ED Course: In the ER x-rays revealed left elbow fracture.  ER physician discussed with Dr. Doreatha Martin orthopedic surgeon at [facility] requested transfer to [facility].  While in the ER since patient has brace on the left upper extremity where the fistula is vascular surgeon at [facility] placed dialysis catheter.  UA shows features concerning for UTI the patient is asymptomatic. ? ?Review of Systems: As per HPI, rest all negative. ? ? ?Past Medical History:  ?Diagnosis Date  ? Arthritis   ? Diabetes mellitus without complication ([place])   ? Hypertension   ? Kidney failure   ? stage 3  ? Proteinuria   ? ? ?Past Surgical History:  ?Procedure Laterality Date  ? DIALYSIS/PERMA CATHETER INSERTION N/A 05/30/2021  ? Procedure: DIALYSIS/PERMA CATHETER INSERTION;  Surgeon: Algernon Huxley, MD;  Location: [place] [facility];  Service: Cardiovascular;  Laterality: N/A;  ? DIALYSIS/PERMA CATHETER REMOVAL N/A 08/02/2021  ? Procedure: DIALYSIS/PERMA CATHETER REMOVAL;  Surgeon: Katha Cabal, MD;  Location: [place] [facility];  Service: Cardiovascular;  Laterality: N/A;  ? EYE SURGERY    ? RENAL BIOPSY    ? TOTAL HIP ARTHROPLASTY Right   ? ? ? reports that she has never smoked. She has never used smokeless tobacco. She reports that she does not drink alcohol and does not use drugs. ? ?Allergies  ?Allergen Reactions  ? Byetta 10 Mcg Pen [Exenatide] Nausea And Vomiting and Other (See Comments)  ?  Weight loss ?  ? Ibuprofen Swelling  ?  Arm swelling - not sure if it was the ibuprofen but she uses tylenol instead.  Is able to take Mobic so this is not a class allergy.  ? Lisinopril Rash  ? Aspirin Other (See Comments)  ?  Unknown ?  ? ? ?History reviewed. No pertinent family history. ? ?Prior to Admission medications   ?Medication Sig Start Date End Date Taking? Authorizing Provider  ?acetaminophen (TYLENOL) 500 MG tablet Take 1,000 mg by mouth every 8 (eight) hours as needed for moderate pain.    [provider]  ?acyclovir (ZOVIRAX) 200 MG capsule Take 200 mg by mouth daily.    [provider]  ?amLODipine (NORVASC) 2.5 MG tablet Take 2.5 mg by mouth daily.    [provider]  ?Ascorbic Acid (VITAMIN C) 1000 MG tablet Take 1,000 mg by mouth daily.    [provider]  ?atorvastatin (LIPITOR) 40 MG tablet Take 40 mg by mouth daily at 6 PM.    [provider]  ?calcium gluconate 500 MG tablet Take 1 tablet by mouth daily. ?Patient not taking: Reported on 03/28/2022    [provider]  ?Cholecalciferol (VITAMIN D3) 1000 units CAPS Take 2,000 Units by mouth every morning.    [provider]  ?dexamethasone (DECADRON) 4 MG tablet Take 8 mg by mouth See admin instructions. Once a week, before [place] treatment.    [provider]  ?docusate sodium (COLACE) 100 MG capsule Take 1 capsule (100 mg total) by mouth 2 (two) times daily. ?Patient taking differently: Take 100  mg by mouth 2 (two) times daily as needed for moderate constipation. 07/04/16   Theodoro Grist, MD  ?DULoxetine (CYMBALTA) 60 MG capsule Take 60 mg by mouth daily.    [provider]  ?ferrous sulfate 325 (65 FE) MG tablet Take 325 mg by mouth daily with breakfast.    [provider]  ?hydrOXYzine (ATARAX/VISTARIL) 10 MG tablet Take 10 mg by mouth 3 (three) times daily as needed for itching.    [provider]  ?levothyroxine (SYNTHROID) 150 MCG tablet Take 150 mcg by mouth daily before breakfast. 06/17/16   [provider]  ?lidocaine-prilocaine (EMLA) cream Apply 1  application topically as needed (dialysis access).    [provider]  ?NINLARO 3 MG capsule Take 3 mg by mouth once a week. Once a week for 3 weeks in a 28 day cycle 07/25/21   [provider]  ?ondansetron (ZOFRAN) 8 MG tablet Take 8 mg by mouth every 8 (eight) hours as needed for nausea or vomiting.    [provider]  ?Oxycodone HCl 10 MG TABS Take 5-10 mg by mouth 5 (five) times daily. 06/21/16   [provider]  ?polyethylene glycol (MIRALAX / GLYCOLAX) packet Take 17 g by mouth daily as needed for moderate constipation.    [provider]  ?vitamin B-12 (CYANOCOBALAMIN) 1000 MCG tablet Take 1,000 mcg by mouth every morning.    [provider]  ? ? ?Physical Exam: ?Constitutional: Moderately built and nourished.  Blood pressure is 139/73 temperature 99 ?F pulse 109/min respiration 15/min. ?There were no vitals filed for this visit. ?Eyes: Anicteric no pallor. ?ENMT: No discharge from the ears eyes nose and mouth. ?Neck: No mass felt.  No neck rigidity. ?Respiratory: No rhonchi or crepitations. ?Cardiovascular: S1-S2 heard. ?Abdomen: Soft nontender bowel sound present. ?Musculoskeletal: Left upper extremity in same brace. ?Skin: No rash. ?Neurologic: Alert awake oriented to name and place moving all extremities.  Patient is hard of hearing. ?Psychiatric: Oriented to name and place. ? ? ?Labs on Admission: I have personally reviewed following labs and imaging studies ? ?CBC: ?Recent Labs  ?Lab 03/28/22 ?0631  ?WBC 12.3*  ?HGB 10.8*  ?HCT 34.4*  ?MCV 95.3  ?PLT 242  ? ?Basic Metabolic Panel: ?Recent Labs  ?Lab 03/28/22 ?0631  ?NA 141  ?K 5.0  ?CL 100  ?CO2 27  ?GLUCOSE 141*  ?BUN 54*  ?CREATININE 6.46*  ?CALCIUM 9.3  ? ?GFR: ?Estimated Creatinine Clearance: 5.9 mL/min (A) (by C-G formula based on SCr of 6.46 mg/dL (H)). ?Liver Function Tests: ?No results for input(s): AST, ALT, ALKPHOS, BILITOT, PROT, ALBUMIN in the last 168 hours. ?No results for input(s):  LIPASE, AMYLASE in the last 168 hours. ?No results for input(s): AMMONIA in the last 168 hours. ?Coagulation Profile: ?No results for input(s): INR, PROTIME in the last 168 hours. ?Cardiac Enzymes: ?No results for input(s): CKTOTAL, CKMB, CKMBINDEX, TROPONINI in the last 168 hours. ?BNP (last 3 results) ?No results for input(s): PROBNP in the last 8760 hours. ?HbA1C: ?No results for input(s): HGBA1C in the last 72 hours. ?CBG: ?No results for input(s): GLUCAP in the last 168 hours. ?Lipid Profile: ?No results for input(s): CHOL, HDL, LDLCALC, TRIG, CHOLHDL, LDLDIRECT in the last 72 hours. ?Thyroid Function Tests: ?No results for input(s): TSH, T4TOTAL, FREET4, T3FREE, THYROIDAB in the last 72 hours. ?Anemia Panel: ?No results for input(s): VITAMINB12, FOLATE, FERRITIN, TIBC, IRON, RETICCTPCT in the last 72 hours. ?Urine analysis: ?   ?Component Value Date/Time  ? COLORURINE  YELLOW (A) 03/28/2022 0115  ? APPEARANCEUR TURBID (A) 03/28/2022 0115  ? LABSPEC 1.008 03/28/2022 0115  ? PHURINE 7.0 03/28/2022 0115  ? GLUCOSEU NEGATIVE 03/28/2022 0115  ? HGBUR SMALL (A) 03/28/2022 0115  ? [place] NEGATIVE 03/28/2022 0115  ? [place] NEGATIVE 03/28/2022 0115  ? PROTEINUR 100 (A) 03/28/2022 0115  ? NITRITE NEGATIVE 03/28/2022 0115  ? LEUKOCYTESUR LARGE (A) 03/28/2022 0115  ? ?Sepsis Labs: ?'@LABRCNTIP'$ (procalcitonin:4,lacticidven:4) ?) ?Recent Results (from the past 240 hour(s))  ?Resp Panel by RT-PCR (Flu A&B, Covid) Nasopharyngeal Swab     Status: None  ? Collection Time: 03/28/22  6:31 AM  ? Specimen: Nasopharyngeal Swab; Nasopharyngeal(NP) swabs in vial transport medium  ?Result Value Ref Range Status  ? SARS Coronavirus 2 by RT PCR NEGATIVE NEGATIVE Final  ?  Comment: (NOTE) ?SARS-CoV-2 target nucleic acids are NOT DETECTED. ? ?The SARS-CoV-2 RNA is generally detectable in upper respiratory ?specimens during the acute phase of infection. The lowest ?concentration of SARS-CoV-2 viral copies this assay can detect is ?138  copies/mL. A negative result does not preclude SARS-Cov-2 ?infection and should not be used as the sole basis for treatment or ?other patient management decisions. A negative result may occur with  ?impr

## 2022-03-29 NOTE — Anesthesia Postprocedure Evaluation (Signed)
Anesthesia Post Note ? ?Patient: Christina Mercado ? ?Procedure(s) Performed: OPEN REDUCTION INTERNAL FIXATION (ORIF) LEFT ELBOW/OLECRANON FRACTURE (Left: Elbow) ? ?  ? ?Patient location during evaluation: PACU ?Anesthesia Type: General ?Level of consciousness: sedated and patient cooperative ?Pain management: pain level controlled ?Vital Signs Assessment: post-procedure vital signs reviewed and stable ?Respiratory status: spontaneous breathing ?Cardiovascular status: stable ?Anesthetic complications: no ? ? ?No notable events documented. ? ?Last Vitals:  ?Vitals:  ? 03/29/22 1335 03/29/22 1350  ?BP: (!) 102/52 119/66  ?Pulse: 80 94  ?Resp: (!) 26 19  ?Temp: 36.5 ?C 36.5 ?C  ?SpO2: 93% 92%  ?  ?Last Pain:  ?Vitals:  ? 03/29/22 1320  ?TempSrc:   ?PainSc: Asleep  ? ? ?  ?  ?  ?  ?  ?  ? ?[name] ? ? ? ? ?

## 2022-03-29 NOTE — H&P (View-Only) (Signed)
Orthopaedic Trauma Service (OTS) Consult  ? ?Patient ID: ?Christina Mercado ?MRN: [id number] ?DOB/AGE: 84-Jan-1937 84 y.o. ? ?Reason for Consult:Left distal humerus fracture ?Referring Physician: Dr. Gean Birchwood, MD [facility] ? ?HPI: Christina Mercado is an 86 y.o. female who is being seen in consultation at the request of Dr. Hal Hope for evaluation of left distal humerus fracture.  Patient has a history of end-stage renal disease on dialysis rheumatoid arthritis, hypertension and type 2 diabetes.  She tripped and fell and sustained a supracondylar distal humerus fracture.  She presented to [facility] emergency room where x-rays showed this and it was felt to be outside the scope of practice for the orthopedic providers at that hospital so it was recommended treatment by an orthopedic traumatologist.  She was subsequently transferred over here overnight. ? ?Patient received hemodialysis yesterday.  She has fistula in that arm and there were not able to access this.  Patient is confused this morning, though she is not able to provide any significant history.  I talked to her son over the phone who provided some more history.  She was found to have a UTI and was given ceftriaxone yesterday and this morning.  Her white blood cell count has improved and she has been afebrile over the last 12 to 24 hours.  Currently complaining of significant pain to her left elbow with any movement.  Denies any significant numbness or tingling a full assessment due to confusion. ? ?Past Medical History:  ?Diagnosis Date  ? Arthritis   ? Diabetes mellitus without complication ([place])   ? Hypertension   ? Kidney failure   ? stage 3  ? Proteinuria   ? ? ?Past Surgical History:  ?Procedure Laterality Date  ? DIALYSIS/PERMA CATHETER INSERTION N/A 05/30/2021  ? Procedure: DIALYSIS/PERMA CATHETER INSERTION;  Surgeon: Algernon Huxley, MD;  Location: [facility];  Service: Cardiovascular;  Laterality: N/A;  ? DIALYSIS/PERMA  CATHETER INSERTION N/A 03/28/2022  ? Procedure: DIALYSIS/PERMA CATHETER INSERTION;  Surgeon: Evaristo Bury, MD;  Location: [facility];  Service: Cardiovascular;  Laterality: N/A;  ? DIALYSIS/PERMA CATHETER REMOVAL N/A 08/02/2021  ? Procedure: DIALYSIS/PERMA CATHETER REMOVAL;  Surgeon: Katha Cabal, MD;  Location: [facility];  Service: Cardiovascular;  Laterality: N/A;  ? EYE SURGERY    ? RENAL BIOPSY    ? TOTAL HIP ARTHROPLASTY Right   ? ? ?History reviewed. No pertinent family history. ? ?Social History:  reports that she has never smoked. She has never used smokeless tobacco. She reports that she does not drink alcohol and does not use drugs. ? ?Allergies:  ?Allergies  ?Allergen Reactions  ? Byetta 10 Mcg Pen [Exenatide] Nausea And Vomiting and Other (See Comments)  ?  Weight loss ?  ? Ibuprofen Swelling  ?  Arm swelling - not sure if it was the ibuprofen but she uses tylenol instead. Is able to take Mobic so this is not a class allergy.  ? Lisinopril Rash  ? Aspirin Other (See Comments)  ?  Unknown ?  ? ? ?Medications:  ?No current facility-administered medications on file prior to encounter.  ? ?Current Outpatient Medications on File Prior to Encounter  ?Medication Sig Dispense Refill  ? acetaminophen (TYLENOL) 500 MG tablet Take 1,000 mg by mouth every 8 (eight) hours as needed for moderate pain.    ? acyclovir (ZOVIRAX) 200 MG capsule Take 200 mg by mouth daily.    ? amLODipine (NORVASC) 2.5 MG tablet Take 2.5 mg by  mouth daily.    ? Ascorbic Acid (VITAMIN C) 1000 MG tablet Take 1,000 mg by mouth daily.    ? atorvastatin (LIPITOR) 40 MG tablet Take 40 mg by mouth daily at 6 PM.    ? calcium gluconate 500 MG tablet Take 1 tablet by mouth daily. (Patient not taking: Reported on 03/28/2022)    ? Cholecalciferol (VITAMIN D3) 1000 units CAPS Take 2,000 Units by mouth every morning.    ? dexamethasone (DECADRON) 4 MG tablet Take 8 mg by mouth See admin instructions. Once a week, before  [place] treatment.    ? docusate sodium (COLACE) 100 MG capsule Take 1 capsule (100 mg total) by mouth 2 (two) times daily. (Patient taking differently: Take 100 mg by mouth 2 (two) times daily as needed for moderate constipation.) 10 capsule 0  ? DULoxetine (CYMBALTA) 60 MG capsule Take 60 mg by mouth daily.    ? ferrous sulfate 325 (65 FE) MG tablet Take 325 mg by mouth daily with breakfast.    ? hydrOXYzine (ATARAX/VISTARIL) 10 MG tablet Take 10 mg by mouth 3 (three) times daily as needed for itching.    ? levothyroxine (SYNTHROID) 150 MCG tablet Take 150 mcg by mouth daily before breakfast.    ? lidocaine-prilocaine (EMLA) cream Apply 1 application topically as needed (dialysis access).    ? NINLARO 3 MG capsule Take 3 mg by mouth once a week. Once a week for 3 weeks in a 28 day cycle    ? ondansetron (ZOFRAN) 8 MG tablet Take 8 mg by mouth every 8 (eight) hours as needed for nausea or vomiting.    ? Oxycodone HCl 10 MG TABS Take 5-10 mg by mouth 5 (five) times daily.  0  ? polyethylene glycol (MIRALAX / GLYCOLAX) packet Take 17 g by mouth daily as needed for moderate constipation.    ? vitamin B-12 (CYANOCOBALAMIN) 1000 MCG tablet Take 1,000 mcg by mouth every morning.    ?  ? ?ROS: Unable to obtain due to confusion ? ?Exam: ?Blood pressure (!) 169/78, pulse (!) 110, temperature 98.2 ?F (36.8 ?C), temperature source Oral, resp. rate 18, height '5\' 5"'$  (1.651 m), SpO2 98 %. ?General: No acute distress ?Orientation: Awake and alert. ?Mood and Affect: Not fully cooperative and has appropriate affect. ?Gait: Unable to assess due to her fracture ?Coordination and balance: Within normal limits ? ?Injured Extremity (CV, lymph, sensation, reflexes): Left upper extremity: Splint is in place is clean dry and intact.  Compartments are soft compressible.  She is able to tolerate no motion of the shoulder.  She is able to wiggle her fingers and has intact motor and sensory function to median, radial and ulnar nerve  distribution.  She has warm well-perfused hand with brisk cap refill ? ?Right Upper extremity: Skin without lesions. No tenderness to palpation. Full painless ROM, full strength in each muscle groups without evidence of instability. ? ? ?Medical Decision Making: ?Data: ?Imaging: X-rays of the left elbow are reviewed which shows neck to articular supracondylar distal humerus fracture with significant displacement. ? ?Labs:  ?Results for orders placed or performed during the hospital encounter of 03/29/22 (from the past 24 hour(s))  ?Basic metabolic panel     Status: Abnormal  ? Collection Time: 03/29/22  5:47 AM  ?Result Value Ref Range  ? Sodium 137 135 - 145 mmol/L  ? Potassium 4.9 3.5 - 5.1 mmol/L  ? Chloride 99 98 - 111 mmol/L  ? CO2 29 22 - 32 mmol/L  ?  Glucose, Bld 118 (H) 70 - 99 mg/dL  ? BUN 22 8 - 23 mg/dL  ? Creatinine, Ser 4.10 (H) 0.44 - 1.00 mg/dL  ? Calcium 9.3 8.9 - 10.3 mg/dL  ? GFR, Estimated 10 (L) >60 mL/min  ? Anion gap 9 5 - 15  ?CBC with Differential/Platelet     Status: Abnormal  ? Collection Time: 03/29/22  5:47 AM  ?Result Value Ref Range  ? WBC 9.3 4.0 - 10.5 K/uL  ? RBC 3.44 (L) 3.87 - 5.11 MIL/uL  ? Hemoglobin 10.8 (L) 12.0 - 15.0 g/dL  ? HCT 33.2 (L) 36.0 - 46.0 %  ? MCV 96.5 80.0 - 100.0 fL  ? MCH 31.4 26.0 - 34.0 pg  ? MCHC 32.5 30.0 - 36.0 g/dL  ? RDW 15.2 11.5 - 15.5 %  ? Platelets 195 150 - 400 K/uL  ? nRBC 0.0 0.0 - 0.2 %  ? Neutrophils Relative % 61 %  ? Neutro Abs 5.7 1.7 - 7.7 K/uL  ? Lymphocytes Relative 22 %  ? Lymphs Abs 2.0 0.7 - 4.0 K/uL  ? Monocytes Relative 15 %  ? Monocytes Absolute 1.4 (H) 0.1 - 1.0 K/uL  ? Eosinophils Relative 1 %  ? Eosinophils Absolute 0.1 0.0 - 0.5 K/uL  ? Basophils Relative 1 %  ? Basophils Absolute 0.1 0.0 - 0.1 K/uL  ? Immature Granulocytes 0 %  ? Abs Immature Granulocytes 0.04 0.00 - 0.07 K/uL  ?Hepatic function panel     Status: Abnormal  ? Collection Time: 03/29/22  5:47 AM  ?Result Value Ref Range  ? Total Protein 5.9 (L) 6.5 - 8.1 g/dL  ?  Albumin 3.0 (L) 3.5 - 5.0 g/dL  ? AST 18 15 - 41 U/L  ? ALT 12 0 - 44 U/L  ? Alkaline Phosphatase 65 38 - 126 U/L  ? Total Bilirubin 0.6 0.3 - 1.2 mg/dL  ? Bilirubin, Direct 0.2 0.0 - 0.2 mg/dL  ? Indirect Bilirubin 0.4

## 2022-03-30 ENCOUNTER — Encounter (HOSPITAL_COMMUNITY): Payer: Self-pay | Admitting: Student

## 2022-03-30 DIAGNOSIS — S42402A Unspecified fracture of lower end of left humerus, initial encounter for closed fracture: Secondary | ICD-10-CM | POA: Diagnosis not present

## 2022-03-30 LAB — GLUCOSE, CAPILLARY
Glucose-Capillary: 123 mg/dL — ABNORMAL HIGH (ref 70–99)
Glucose-Capillary: 137 mg/dL — ABNORMAL HIGH (ref 70–99)
Glucose-Capillary: 139 mg/dL — ABNORMAL HIGH (ref 70–99)
Glucose-Capillary: 150 mg/dL — ABNORMAL HIGH (ref 70–99)
Glucose-Capillary: 169 mg/dL — ABNORMAL HIGH (ref 70–99)
Glucose-Capillary: 179 mg/dL — ABNORMAL HIGH (ref 70–99)
Glucose-Capillary: 90 mg/dL (ref 70–99)

## 2022-03-30 LAB — RENAL FUNCTION PANEL
Albumin: 2.6 g/dL — ABNORMAL LOW (ref 3.5–5.0)
Albumin: 2.7 g/dL — ABNORMAL LOW (ref 3.5–5.0)
Anion gap: 12 (ref 5–15)
Anion gap: 13 (ref 5–15)
BUN: 37 mg/dL — ABNORMAL HIGH (ref 8–23)
BUN: 51 mg/dL — ABNORMAL HIGH (ref 8–23)
CO2: 25 mmol/L (ref 22–32)
CO2: 25 mmol/L (ref 22–32)
Calcium: 8.6 mg/dL — ABNORMAL LOW (ref 8.9–10.3)
Calcium: 8.6 mg/dL — ABNORMAL LOW (ref 8.9–10.3)
Chloride: 103 mmol/L (ref 98–111)
Chloride: 100 mmol/L (ref 98–111)
Creatinine, Ser: 5.33 mg/dL — ABNORMAL HIGH (ref 0.44–1.00)
Creatinine, Ser: 5.92 mg/dL — ABNORMAL HIGH (ref 0.44–1.00)
GFR, Estimated: 8 mL/min — ABNORMAL LOW (ref >60)
GFR, Estimated: 7 mL/min — ABNORMAL LOW (ref >60)
Glucose, Bld: 177 mg/dL — ABNORMAL HIGH (ref 70–99)
Glucose, Bld: 124 mg/dL — ABNORMAL HIGH (ref 70–99)
Phosphorus: 6 mg/dL — ABNORMAL HIGH (ref 2.5–4.6)
Phosphorus: 6.3 mg/dL — ABNORMAL HIGH (ref 2.5–4.6)
Potassium: 3.8 mmol/L (ref 3.5–5.1)
Potassium: 4.4 mmol/L (ref 3.5–5.1)
Sodium: 140 mmol/L (ref 135–145)
Sodium: 138 mmol/L (ref 135–145)

## 2022-03-30 LAB — URINE CULTURE: Culture: >=100000 — AB

## 2022-03-30 LAB — MAGNESIUM: Magnesium: 1.6 mg/dL — ABNORMAL LOW (ref 1.7–2.4)

## 2022-03-30 MED ORDER — HEPARIN SODIUM (PORCINE) 5000 UNIT/ML IJ SOLN
5000.0000 [IU] | Freq: Three times a day (TID) | INTRAMUSCULAR | Status: DC
  Administered 2022-03-30 – 2022-04-01 (×6): 5000 [IU] via SUBCUTANEOUS
  Filled 2022-03-30 (×6): qty 1

## 2022-03-30 MED ORDER — MELATONIN 5 MG PO TABS
5.0000 mg | ORAL_TABLET | Freq: Every evening | ORAL | Status: DC | PRN
  Administered 2022-03-30 – 2022-03-31 (×2): 5 mg via ORAL
  Filled 2022-03-30 (×3): qty 1

## 2022-03-30 MED ORDER — CHLORHEXIDINE GLUCONATE CLOTH 2 % EX PADS
6.0000 | MEDICATED_PAD | Freq: Every day | CUTANEOUS | Status: DC
  Administered 2022-03-30 – 2022-03-31 (×2): 6 via TOPICAL

## 2022-03-30 MED ORDER — HYDROMORPHONE HCL 1 MG/ML IJ SOLN
0.2500 mg | Freq: Once | INTRAMUSCULAR | Status: AC
  Administered 2022-03-30: 0.25 mg via INTRAVENOUS
  Filled 2022-03-30: qty 1

## 2022-03-30 NOTE — TOC Progression Note (Signed)
Transition of Care (TOC) - Initial/Assessment Note  ? ? ?Patient Details  ?Name: Christina Mercado ?MRN: [id number] ?Date of Birth: Sep 27, 1936 ? ?Transition of Care (TOC) CM/SW Contact:    ?Paulene Floor Catalyna Reilly, LCSWA ?Phone Number: ?03/30/2022, 8:40 AM ? ?Clinical Narrative:                 ?CSW spoke with [place] at [facility] where the patient resides.  The facility is unable to provide Skilled Nursing level care, but does contract with an agency [employer]) for home health.  The patient can return to the facility after receiving rehab at a SNF.   ? ?  ?  ? ? ?Patient Goals and CMS Choice ?  ?  ?  ? ?Expected Discharge Plan and Services ?  ?  ?  ?  ?  ?                ?  ?  ?  ?  ?  ?  ?  ?  ?  ?  ? ?Prior Living Arrangements/Services ?  ?  ?  ?       ?  ?  ?  ?  ? ?Activities of Daily Living ?  ?  ? ?Permission Sought/Granted ?  ?  ?   ?   ?   ?   ? ?Emotional Assessment ?  ?  ?  ?  ?  ?  ? ?Admission diagnosis:  Left elbow fracture [S42.402A] ?Patient Active Problem List  ? Diagnosis Date Noted  ? DM (diabetes mellitus), type 2 with renal complications ([place]) 00/92/3300  ? Left elbow fracture 03/29/2022  ? Elbow fracture, left 03/28/2022  ? Multiple myeloma ([place]) 02/16/2022  ? ESRD on dialysis ([place]) 02/16/2022  ? Tricompartment osteoarthritis of knees (Bilateral) 02/16/2022  ? Osteoarthritis of knees (Bilateral) 02/16/2022  ? Osteoarthritis of knee (Right) 02/16/2022  ? Osteoarthritis involving multiple joints 02/16/2022  ? DDD (degenerative disc disease), cervical 02/16/2022  ? Anterolisthesis of cervical spine (C4/C5 and C7/T1) 02/16/2022  ? Protrusion of cervical intervertebral disc (C3-4) 02/16/2022  ? Chronic rotator cuff arthropathy of shoulder (Right) 02/16/2022  ? Superior shoulder subluxation, sequela (Right) 02/16/2022  ? Osteoarthritis of AC (acromioclavicular) joint (Right) 02/16/2022  ? Osteoarthritis of glenohumeral joint (Right) 02/16/2022  ? Osteoarthritis of glenohumeral joint (Left) 02/16/2022  ?  Shoulder subluxation, sequela (Left) 02/16/2022  ? Osteoarthritis of hip (Left) 02/16/2022  ? Effusion of knee joints (Bilateral) 02/16/2022  ? Hip joint effusion (Left) 02/16/2022  ? Osteopenia determined by x-ray 02/16/2022  ? Patellofemoral arthritis of knee (Left) 02/16/2022  ? Chronic shoulder pain (2ry area of Pain) (Bilateral) (L>R) 02/06/2022  ? Abnormal CT scan, cervical spine (02/26/2019) 02/06/2022  ? Chronic pain syndrome 02/05/2022  ? Pharmacologic therapy 02/05/2022  ? Disorder of skeletal system 02/05/2022  ? Problems influencing health status 02/05/2022  ? Acquired hypothyroidism 02/02/2022  ? Pelvic relaxation due to uterovaginal prolapse, incomplete 02/02/2022  ? End stage renal disease ([place]) 02/02/2022  ? Neuropathy due to chemotherapeutic drug ([place]) 01/05/2021  ? Encounter regarding vascular access for dialysis for ESRD ([place]) 01/05/2021  ? 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 Recurrent major depressive disorder, in partial remission ([place]) 05/11/2020  ? Closed fracture of femur, intertrochanteric, sequela (Left) 11/11/2018  ? Chronic rotator cuff arthropathy of shoulder (Left) 11/11/2018  ? Chronic knee pain (1ry area of Pain) (Bilateral) (L>R) 06/18/2018  ? CKD (chronic kidney disease), stage V ([place]) 06/02/2018  ?  Inability to ambulate due to multiple joints 06/02/2018  ? Hypertension secondary to other renal disorders 06/22/2017  ? Secondary hyperparathyroidism ([place]) 01/31/2017  ? 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  ? 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 Leukocytosis 07/04/2016  ? E-coli UTI 07/04/2016  ? Essential hypertension, malignant 07/04/2016  ? Pyelonephritis 07/02/2016  ? Hematuria 07/02/2016  ? Abdominal pain 07/02/2016  ? Acute on chronic renal failure ([place]) 07/02/2016  ? Rheumatoid arthritis, seropositive ([place]) 03/06/2016  ? Onychogryphosis 06/29/2015  ? Onychomycosis due to dermatophyte 06/29/2015  ? Bradycardia, sinus  05/03/2015  ? Premature contractions, atrial 05/03/2015  ? Anemia 01/21/2013  ? Chronic kidney disease, stage V (very severe) ([place]) 09/18/2012  ? Chronic pain disorder 07/16/2012  ? History of total hip replacement (Right) 07/08/2012  ? Osteoarthritis of knee (Left) 04/10/2012  ? Constipation 03/13/2012  ? Bilateral lower extremity edema 02/08/2012  ? Cataracts, bilateral 12/28/2011  ? Hyperlipidemia with target LDL less than 100 12/28/2011  ? Osteoporosis, post-menopausal 12/28/2011  ? ?PCP:  Myrtie Soman, MD ?Pharmacy:   ?[facility] [address] ?[place] [address] ?Phone: [phone] Fax: [phone] ? ?[place] [address] ?[address] ?[place] [address] ?Phone: [phone] Fax: [phone] ? ? ? ? ?Social Determinants of Health (SDOH) Interventions ?  ? ?Readmission Risk Interventions ?   ? View : No data to display.  ?  ?  ?  ? ? ? ?

## 2022-03-30 NOTE — Progress Notes (Signed)
TRH night cross cover note: ? ?I was notified by RN of patient's request for a sleep aid.  I subsequently placed order for prn melatonin. ? ? ? ? ?Babs Bertin, DO ?Hospitalist  ?

## 2022-03-30 NOTE — TOC Initial Note (Signed)
Transition of Care (TOC) - Initial/Assessment Note  ? ? ?Patient Details  ?Name: Christina Mercado ?MRN: [id number] ?Date of Birth: 11/27/1936 ? ?Transition of Care (TOC) CM/SW Contact:    ?Paulene Floor Honora Searson, LCSWA ?Phone Number: ?03/30/2022, 12:14 PM ? ?Clinical Narrative:                 ?CSW received consult for possible SNF placement at time of discharge. CSW spoke with patient. The patient's orientation was fluuating during the encounter.  The patient was at one point able to inform CSW of the reasoning for the hospitalization and agreeable to SNF.  Then a few minutes later asked who CSW was and reported that she had surgery on her shoulder. ( The surgery was on her elbow.)  The patient gave CSW permission to speak with her son, Gwyndolyn Saxon.   ? ?CSW contacted the patient's son. The son expressed understanding of PT recommendation and is agreeable to SNF placement at time of discharge with a preference for a facility in [place]. CSW discussed insurance authorization process and will provide Medicare SNF ratings list. Patient has received 4 COVID vaccines. CSW will send out referrals for review.  No further questions reported at this time.  ? ?Skilled Nursing Rehab Facilities-   [email or web address]   Ratings out of 5 possible   ?Name Address  Phone # Quality Care Staffing Health Inspection Overall  ?[facility] [address] [place] 4 5 2 3   ?[place], [place] [phone] 3 2 5 5   ?[facility] [place] 3 1 1 1   ?[place] [address] [place] 3 2 4 4   ?[facility] [address], [place] 1 1 2 1   ?[place] [phone] 2 1 4 3   ?[place], [place]certain 5 2 3 4   ?[facility] [address] [place] 5 2 2 3   ?[facility] [place] [phone] 5 1 2 2   ?[facility] [address], [place] [phone] 4 1 2 1   ?[facility] [address] [place] [phone] 4 1 2 1   ?[facility] [place], [place] [phone] 4 1 1 1   ?[facility] [address] [place] 3 2 4 4   ?        ?[place], [place]      ?[facility] [place] 4 2 3 3   ?[facility] [place], [place] 4 1 5 4   ?[facility] [address] [place] [address], [place] [phone] 2 1 1 1   ?[facility] [address] [place] [phone] 2 1 3 2   ?        ?[address] (no [facility]) [address], [place] [phone] 4 5 5 5   ?[facility] (No Humana) [address], [place] [phone] 3 1 4 3   ?[facility] (No UHC) [address] [place] [phone] 5 5 5 5   ?[facility] [address], [employer] [phone] 3 2 4 4   ?[place] [address] [place] 1 1 2 1   ?[facility] [address] [place] [phone] 2 1 1 1   ?[facility] [place] 5 2 4 5   ?[facility] [address] [place] 3 1 1 1   ?  [facility] [place] 2 1 2 1   ?        ?[facility] [address], [place] [phone] 1 1 1 1   ?[facility] [facility] [address] [place]  [phone] 2 4 2 2   ?[facility] [place] [address], [place] [phone] 5 2 3 4   ?[facility] [address], [place] 2 1 1 1   ?[place] (No Humana) [address] [place] 2 1 3 2   ?[facility] [address], [place] [phone] 3 1 1 1   ?        ?[facility] [place], [place] 5 4 5 5   ?[facility])  [address], [place]  2 2 3 3   ?[facility]) [place], [place] 3 2 4 4   ?[place] [address], [place] 4 3 4 4   ?[address] [place], [place] 3 3 1 1   ?[facility]) [address] [place] [phone] 2 2 4 4   ?  ? ?Expected Discharge Plan: [place] ?Barriers to Discharge: Barriers Resolved ? ? ?Patient Goals and CMS Choice ?  ?CMS Medicare.gov Compare Post Acute Care list provided to:: Patient Represenative (must comment) ?  ? ?Expected Discharge Plan and Services ?Expected Discharge Plan: [place] ?  ?  ?  ?Living arrangements for the past 2 months: [place] ?                ?  ?  ?  ?  ?  ?  ?  ?  ?  ?  ? ?Prior Living Arrangements/Services ?Living arrangements for the past 2 months: [place] ?Lives with:: Facility Resident ?Patient language and need for interpreter reviewed:: Yes ?Do you feel safe going back to the place where you live?: Yes      ?Need for Family Participation in Patient Care: Yes (Comment) ?Care giver support system in place?: Yes (comment) ?  ?Criminal Activity/Legal Involvement Pertinent to Current Situation/Hospitalization: No - Comment as needed ? ?Activities of Daily Living ?  ?  ? ?Permission Sought/Granted ?  ?Permission granted to share information with : Yes, Verbal Permission Granted ? Share Information with NAME: Robbie Nangle, son ? Permission granted to share info w AGENCY: SNF ?   ?   ? ?Emotional Assessment ?Appearance:: Appears stated age ?Attitude/Demeanor/Rapport: Inconsistent ?Affect (typically observed): Adaptable ?Orientation: : Fluctuating Orientation (Suspected and/or reported [place]) ?Alcohol / Substance Use: Not Applicable ?Psych Involvement: No (comment) ? ?Admission diagnosis:  Left elbow fracture [S42.402A] ?Patient Active Problem List  ? Diagnosis Date Noted  ? DM (diabetes mellitus), type 2  with renal complications ([place]) 34/35/6861  ? Left elbow fracture 03/29/2022  ? Elbow fracture, left 03/28/2022  ? Multiple myeloma ([place]) 02/16/2022  ? ESRD on dialysis ([place]) 02/16/2022  ? Tricompartment osteoarthritis of knees (Bilateral) 02/16/2022  ? Osteoarthritis of knees (Bilateral) 02/16/2022  ? Osteoarthritis of knee (Right) 02/16/2022  ? Osteoarthritis involving multiple joints 02/16/2022  ? DDD (degenerative disc disease), cervical 02/16/2022  ? Anterolisthesis of cervical spine (C4/C5 and C7/T1) 02/16/2022  ? Protrusion of cervical intervertebral disc (C3-4) 02/16/2022  ? Chronic rotator cuff arthropathy of shoulder (Right) 02/16/2022  ? Superior shoulder subluxation, sequela (Right) 02/16/2022  ? Osteoarthritis of AC (acromioclavicular) joint (Right) 02/16/2022  ? Osteoarthritis of glenohumeral joint (Right) 02/16/2022  ?  Osteoarthritis of glenohumeral joint (Left) 02/16/2022  ? Shoulder subluxation, sequela (Left) 02/16/2022  ? Osteoarthritis of hip (Left) 02/16/2022  ? Effusion of knee joints (Bilateral) 02/16/2022  ? Hip joint effusion (Left) 02/16/2022  ? Osteopenia determined by x-ray 02/16/2022  ? Patellofemoral arthritis of knee (Left) 02/16/2022  ? Chronic shoulder pain (2ry area of Pain) (Bilateral) (L>R) 02/06/2022  ? Abnormal CT scan, cervical spine (02/26/2019) 02/06/2022  ? Chronic pain syndrome 02/05/2022  ? Pharmacologic therapy 02/05/2022  ? Disorder of skeletal system 02/05/2022  ? Problems influencing health status 02/05/2022  ? Acquired hypothyroidism 02/02/2022  ? Pelvic relaxation due to uterovaginal prolapse, incomplete 02/02/2022  ? End stage renal disease ([place]) 02/02/2022  ? Neuropathy due to chemotherapeutic drug ([place]) 01/05/2021  ? Encounter regarding vascular access for dialysis for ESRD ([place]) 01/05/2021  ? 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 Recurrent major depressive disorder, in partial remission ([place]) 05/11/2020  ? Closed fracture of  femur, intertrochanteric, sequela (Left) 11/11/2018  ? Chronic rotator cuff arthropathy of shoulder (Left) 11/11/2018  ? Chronic knee pain (1ry area of Pain) (Bilateral) (L>R) 06/18/2018  ? CKD (chron

## 2022-03-30 NOTE — Progress Notes (Signed)
?   03/30/22 1707  ?Assess: MEWS Score  ?Temp 99.3 ?F (37.4 ?C)  ?BP 131/65  ?Pulse Rate (!) 116  ?Resp 18  ?SpO2 97 %  ?O2 Device Room Air  ?Assess: MEWS Score  ?MEWS Temp 0  ?MEWS Systolic 0  ?MEWS Pulse 2  ?MEWS RR 0  ?MEWS LOC 0  ?MEWS Score 2  ?MEWS Score Color Yellow  ?Assess: if the MEWS score is Yellow or Red  ?Were vital signs taken at a resting state? Yes  ?Focused Assessment No change from prior assessment  ?Early Detection of Sepsis Score *See Row Information* Low  ?MEWS guidelines implemented *See Row Information* Yes  ?Treat  ?MEWS Interventions Administered prn meds/treatments  ?Take Vital Signs  ?Increase Vital Sign Frequency  Yellow: Q 2hr X 2 then Q 4hr X 2, if remains yellow, continue Q 4hrs  ?Escalate  ?MEWS: Escalate Yellow: discuss with charge nurse/RN and consider discussing with provider and RRT  ?Notify: Charge Nurse/RN  ?Name of Charge Nurse/RN Notified Jimmie Molly, RN  ?Date Charge Nurse/RN Notified 03/30/22  ?Time Charge Nurse/RN Notified 1951  ?Document  ?Patient Outcome Stabilized after interventions  ?Progress note created (see row info) Yes  ? ? ?

## 2022-03-30 NOTE — Progress Notes (Signed)
Orthopaedic Trauma Progress Note ? ?[place] ok today. Reports intermittent, moderate pain about operative site. Tylenol not really helping. Received oxycodone about 4 hours ago. No chest pain. No SOB. No nausea/vomiting. No other complaints.  ? ?OBJECTIVE:  ?Vitals:  ? 03/29/22 2015 03/30/22 0451  ?BP: 135/88 (!) 132/54  ?Pulse: 99 (!) 105  ?Resp: 18 16  ?Temp: 98.3 ?F (36.8 ?C) 97.9 ?F (36.6 ?C)  ?SpO2: 99% 98%  ? ? ?General: Resting in bed comfortably, NAD ?Respiratory: No increased work of breathing.  ?Left upper extremity: Sling in place. Dressing clean, dry, intact.tender over elbow as expected. Non-tender in shoulder or throughout forearm. Motor and sensory function intact through median, ulnar, radial nerve distribution ? ?IMAGING: Stable post op imaging.  ? ?LABS:  ?Results for orders placed or performed during the hospital encounter of 03/29/22 (from the past 24 hour(s))  ?Glucose, capillary     Status: Abnormal  ? Collection Time: 03/29/22  9:50 AM  ?Result Value Ref Range  ? Glucose-Capillary 117 (H) 70 - 99 mg/dL  ? Comment 1 Notify RN   ? Comment 2 Document in Chart   ?Surgical pcr screen     Status: None  ? Collection Time: 03/29/22  9:55 AM  ? Specimen: Nasal Mucosa; Nasal Swab  ?Result Value Ref Range  ? MRSA, PCR NEGATIVE NEGATIVE  ? Staphylococcus aureus NEGATIVE NEGATIVE  ?Glucose, capillary     Status: None  ? Collection Time: 03/29/22 12:50 PM  ?Result Value Ref Range  ? Glucose-Capillary 93 70 - 99 mg/dL  ? Comment 1 Notify RN   ? Comment 2 Document in Chart   ?Renal function panel (daily at 1600)     Status: Abnormal  ? Collection Time: 03/29/22  4:14 PM  ?Result Value Ref Range  ? Sodium 138 135 - 145 mmol/L  ? Potassium 4.0 3.5 - 5.1 mmol/L  ? Chloride 100 98 - 111 mmol/L  ? CO2 26 22 - 32 mmol/L  ? Glucose, Bld 137 (H) 70 - 99 mg/dL  ? BUN 25 (H) 8 - 23 mg/dL  ? Creatinine, Ser 4.49 (H) 0.44 - 1.00 mg/dL  ? Calcium 9.1 8.9 - 10.3 mg/dL  ? Phosphorus 5.8 (H) 2.5 - 4.6 mg/dL  ?  Albumin 2.9 (L) 3.5 - 5.0 g/dL  ? GFR, Estimated 9 (L) >60 mL/min  ? Anion gap 12 5 - 15  ?VITAMIN D 25 Hydroxy (Vit-D Deficiency, Fractures)     Status: None  ? Collection Time: 03/29/22  4:14 PM  ?Result Value Ref Range  ? Vit D, 25-Hydroxy 57.43 30 - 100 ng/mL  ?Glucose, capillary     Status: Abnormal  ? Collection Time: 03/29/22  4:49 PM  ?Result Value Ref Range  ? Glucose-Capillary 139 (H) 70 - 99 mg/dL  ?Glucose, capillary     Status: Abnormal  ? Collection Time: 03/29/22  8:15 PM  ?Result Value Ref Range  ? Glucose-Capillary 117 (H) 70 - 99 mg/dL  ?Glucose, capillary     Status: Abnormal  ? Collection Time: 03/30/22 12:05 AM  ?Result Value Ref Range  ? Glucose-Capillary 137 (H) 70 - 99 mg/dL  ?Renal function panel (daily at 0500)     Status: Abnormal  ? Collection Time: 03/30/22  2:13 AM  ?Result Value Ref Range  ? Sodium 140 135 - 145 mmol/L  ? Potassium 3.8 3.5 - 5.1 mmol/L  ? Chloride 103 98 - 111 mmol/L  ? CO2 25 22 - 32 mmol/L  ? Glucose,  Bld 177 (H) 70 - 99 mg/dL  ? BUN 37 (H) 8 - 23 mg/dL  ? Creatinine, Ser 5.33 (H) 0.44 - 1.00 mg/dL  ? Calcium 8.6 (L) 8.9 - 10.3 mg/dL  ? Phosphorus 6.0 (H) 2.5 - 4.6 mg/dL  ? Albumin 2.6 (L) 3.5 - 5.0 g/dL  ? GFR, Estimated 8 (L) >60 mL/min  ? Anion gap 12 5 - 15  ?Magnesium     Status: Abnormal  ? Collection Time: 03/30/22  2:13 AM  ?Result Value Ref Range  ? Magnesium 1.6 (L) 1.7 - 2.4 mg/dL  ?Glucose, capillary     Status: Abnormal  ? Collection Time: 03/30/22  3:59 AM  ?Result Value Ref Range  ? Glucose-Capillary 150 (H) 70 - 99 mg/dL  ?Glucose, capillary     Status: None  ? Collection Time: 03/30/22  7:34 AM  ?Result Value Ref Range  ? Glucose-Capillary 90 70 - 99 mg/dL  ? ? ?ASSESSMENT: Christina Mercado is a 86 y.o. female, 1 Day Post-Op s/p ?ORIF LEFT SUPRACONDYLAR DISTAL HUMERUS FRACTURE ? ?CV/Blood loss: Hemoglobin 10.8 preoperatively, will recheck CBC tomorrow 03/31/2022.  Hemodynamically stable ? ?PLAN: ?Weightbearing: NWB LUE ?ROM: Okay for unrestricted elbow  range of motion ?Incisional and dressing care: OK to remove dressings 03/31/2022 and leave open to air with dry gauze PRN  ?Showering: Hold off on showering until dressing removed.  Okay for bed bath ?Orthopedic device(s):  Sling LUE for comfort   ?Pain management:  ?1. Tylenol 650 mg q 6 hours scheduled ?3. Oxycodone 5-10 mg q 4 hours PRN ?VTE prophylaxis:  Per primary team.  No DVT prophylaxis required from Ortho standpoint. SCDs ?ID: Currently on ceftriaxone for UTI, no additional ABX needed ?Foley/Lines:  No foley, KVO IVFs ?Impediments to Fracture Healing: Vitamin D level 57.  Patient on 2000 units vitamin D3 at baseline, no additional supplementation indicated. ?Dispo: PT/OT evaluation today.  Patient will require SNF prior to returning to independent living facility.  Plan to recheck CBC tomorrow morning.  We will plan to remove dressing from LUE tomorrow and likely leave incisions open to air if there is no drainage.   ? ?D/C recommendations: ?- Oxycodone 5 to 10 mg for pain control ?- DVT prophylaxis at the discretion of primary team ?- No additional Vit D supplementation indicated ? ?Follow - up plan: 2 weeks after discharge for repeat x-rays left elbow and wound check ? ? ?Contact information:  Katha Hamming MD, Rushie Nyhan PA-C. After hours and holidays please check [email or web address] for group call information for [facility] ? ? ?Gwinda Passe, PA-C ?[phone] (office) ?[email or web address] ? ? ? ?

## 2022-03-30 NOTE — [place] FL2 (Signed)
?[place] MEDICAID FL2 LEVEL OF CARE SCREENING TOOL  ?  ? ?IDENTIFICATION  ?Patient Name: ?Christina Mercado Birthdate: 04-04-1936 Sex: female Admission Date (Current Location): ?03/29/2022  ?[place] and [place] Number: ? [place] ?  Facility and Address:  ?[facility] [place], [place] [address] [place], [place] [address] ?     Provider Number: ?[id number]  ?Attending Physician Name and Address:  ?Georgette Shell, MD ? Relative Name and Phone Number:  ?[place], [place])   [phone] ?   ?Current Level of Care: ?Hospital Recommended Level of Care: ?[place] Prior Approval Number: ?  ? ?Date Approved/Denied: ?  PASRR Number: ?[id number] ? ?Discharge Plan: ?SNF ?  ? ?Current Diagnoses: ?Patient Active Problem List  ? Diagnosis Date Noted  ? DM (diabetes mellitus), type 2 with renal complications ([place]) 94/49/6759  ? Left elbow fracture 03/29/2022  ? Elbow fracture, left 03/28/2022  ? Multiple myeloma ([place]) 02/16/2022  ? ESRD on dialysis ([place]) 02/16/2022  ? Tricompartment osteoarthritis of knees (Bilateral) 02/16/2022  ? Osteoarthritis of knees (Bilateral) 02/16/2022  ? Osteoarthritis of knee (Right) 02/16/2022  ? Osteoarthritis involving multiple joints 02/16/2022  ? DDD (degenerative disc disease), cervical 02/16/2022  ? Anterolisthesis of cervical spine (C4/C5 and C7/T1) 02/16/2022  ? Protrusion of cervical intervertebral disc (C3-4) 02/16/2022  ? Chronic rotator cuff arthropathy of shoulder (Right) 02/16/2022  ? Superior shoulder subluxation, sequela (Right) 02/16/2022  ? Osteoarthritis of AC (acromioclavicular) joint (Right) 02/16/2022  ? Osteoarthritis of glenohumeral joint (Right) 02/16/2022  ? Osteoarthritis of glenohumeral joint (Left) 02/16/2022  ? Shoulder subluxation, sequela (Left) 02/16/2022  ? Osteoarthritis of hip (Left) 02/16/2022  ? Effusion of knee joints (Bilateral) 02/16/2022  ? Hip joint effusion (Left) 02/16/2022  ? Osteopenia determined by x-ray 02/16/2022  ?  Patellofemoral arthritis of knee (Left) 02/16/2022  ? Chronic shoulder pain (2ry area of Pain) (Bilateral) (L>R) 02/06/2022  ? Abnormal CT scan, cervical spine (02/26/2019) 02/06/2022  ? Chronic pain syndrome 02/05/2022  ? Pharmacologic therapy 02/05/2022  ? Disorder of skeletal system 02/05/2022  ? Problems influencing health status 02/05/2022  ? Acquired hypothyroidism 02/02/2022  ? Pelvic relaxation due to uterovaginal prolapse, incomplete 02/02/2022  ? End stage renal disease ([place]) 02/02/2022  ? Neuropathy due to chemotherapeutic drug ([place]) 01/05/2021  ? Encounter regarding vascular access for dialysis for ESRD ([place]) 01/05/2021  ? 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 Recurrent major depressive disorder, in partial remission ([place]) 05/11/2020  ? Closed fracture of femur, intertrochanteric, sequela (Left) 11/11/2018  ? Chronic rotator cuff arthropathy of shoulder (Left) 11/11/2018  ? Chronic knee pain (1ry area of Pain) (Bilateral) (L>R) 06/18/2018  ? CKD (chronic kidney disease), stage V ([place]) 06/02/2018  ? Inability to ambulate due to multiple joints 06/02/2018  ? Hypertension secondary to other renal disorders 06/22/2017  ? Secondary hyperparathyroidism ([place]) 01/31/2017  ? 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  ? 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 Leukocytosis 07/04/2016  ? E-coli UTI 07/04/2016  ? Essential hypertension, malignant 07/04/2016  ? Pyelonephritis 07/02/2016  ? Hematuria 07/02/2016  ? Abdominal pain 07/02/2016  ? Acute on chronic renal failure ([place]) 07/02/2016  ? Rheumatoid arthritis, seropositive ([place]) 03/06/2016  ? Onychogryphosis 06/29/2015  ? Onychomycosis due to dermatophyte 06/29/2015  ? Bradycardia, sinus 05/03/2015  ? Premature contractions, atrial 05/03/2015  ? Anemia 01/21/2013  ? Chronic kidney disease, stage V (very severe) ([place]) 09/18/2012  ? Chronic pain disorder 07/16/2012  ? History of total hip replacement (  Right)  07/08/2012  ? Osteoarthritis of knee (Left) 04/10/2012  ? Constipation 03/13/2012  ? Bilateral lower extremity edema 02/08/2012  ? Cataracts, bilateral 12/28/2011  ? Hyperlipidemia with target LDL less than 100 12/28/2011  ? Osteoporosis, post-menopausal 12/28/2011  ? ? ?Orientation RESPIRATION BLADDER Height & Weight   ?  ?Self ? Normal Incontinent Weight: 137 lb 9.1 oz (62.4 kg) ?Height:  5' 5" (165.1 cm)  ?BEHAVIORAL SYMPTOMS/MOOD NEUROLOGICAL BOWEL NUTRITION STATUS  ?    Continent Diet (see d/c order)  ?AMBULATORY STATUS COMMUNICATION OF NEEDS Skin   ?Extensive Assist Verbally Surgical wounds ?  ?  ?  ?    ?     ?     ? ? ?Personal Care Assistance Level of Assistance  ?Feeding, Dressing   ?Feeding assistance: Limited assistance ?  ?   ? ?Functional Limitations Info  ?Hearing, Sight, Speech Sight Info: Adequate ?Hearing Info: Impaired ?Speech Info: Adequate  ? ? ?SPECIAL CARE FACTORS FREQUENCY  ?PT (By licensed PT), OT (By licensed OT)   ?  ?PT Frequency: 5x/ week ?OT Frequency: 5x/ week ?  ?  ?  ?   ? ? ?Contractures Contractures Info: Not present  ? ? ?Additional Factors Info  ?Code Status, Allergies, Insulin Sliding Scale Code Status Info: Full ?Allergies Info: Byetta 10 Mcg Pen (Exenatide)   Ibuprofen   Lisinopril   Aspirin ?  ?Insulin Sliding Scale Info: see d/c med list ?  ?   ? ?Current Medications (03/30/2022):  This is the current hospital active medication list ?Current Facility-Administered Medications  ?Medication Dose Route Frequency Provider Last Rate Last Admin  ? acetaminophen (TYLENOL) tablet 650 mg  650 mg Oral Q6H McClung, Sarah A, PA-C   650 mg at 03/30/22 0619  ? cefTRIAXone (ROCEPHIN) 1 g in sodium chloride 0.9 % 100 mL IVPB  1 g Intravenous Q24H McClung, Sarah A, PA-C 200 mL/hr at 03/30/22 0612 1 g at 03/30/22 0612  ? Chlorhexidine Gluconate Cloth 2 % PADS 6 each  6 each Topical Daily Mathews, Elizabeth G, MD      ? cholecalciferol (VITAMIN D) tablet 2,000 Units  2,000 Units Oral BH-q7a  McClung, Sarah A, PA-C   2,000 Units at 03/30/22 0625  ? docusate sodium (COLACE) capsule 100 mg  100 mg Oral BID McClung, Sarah A, PA-C   100 mg at 03/30/22 0934  ? hydrALAZINE (APRESOLINE) injection 10 mg  10 mg Intravenous Q4H PRN McClung, Sarah A, PA-C      ? insulin aspart (novoLOG) injection 0-6 Units  0-6 Units Subcutaneous Q4H McClung, Sarah A, PA-C      ? melatonin tablet 5 mg  5 mg Oral QHS PRN Howerter, Justin B, DO      ? metoCLOPramide (REGLAN) tablet 5-10 mg  5-10 mg Oral Q8H PRN McClung, Sarah A, PA-C      ? Or  ? metoCLOPramide (REGLAN) injection 5-10 mg  5-10 mg Intravenous Q8H PRN McClung, Sarah A, PA-C      ? ondansetron (ZOFRAN) tablet 4 mg  4 mg Oral Q6H PRN McClung, Sarah A, PA-C      ? Or  ? ondansetron (ZOFRAN) injection 4 mg  4 mg Intravenous Q6H PRN McClung, Sarah A, PA-C      ? oxyCODONE (Oxy IR/ROXICODONE) immediate release tablet 10 mg  10 mg Oral QID PRN McClung, Sarah A, PA-C   10 mg at 03/30/22 0836  ? oxyCODONE (Oxy IR/ROXICODONE) immediate release tablet 5 mg  5 mg Oral   QHS PRN McClung, Sarah A, PA-C      ? polyethylene glycol (MIRALAX / GLYCOLAX) packet 17 g  17 g Oral Daily PRN McClung, Sarah A, PA-C      ? ? ? ?Discharge Medications: ?Please see discharge summary for a list of discharge medications. ? ?Relevant Imaging Results: ? ?Relevant Lab Results: ? ? ?Additional Information ?SSN:  [id number];  PFIZER Comrnaty(Gray TOP) Covid-19 Vaccine 12/16/2020 , 05/12/2020 , 04/20/2020  Pfizer Covid-19 Vaccine Bivalent Booster 10/27/2021 ? ? F , LCSWA ? ? ? ? ?

## 2022-03-30 NOTE — Consult Note (Signed)
Reason for Consult: ESRD ?Referring Physician: Dr. Velia Meyer ? ?Chief Complaint: Left elbow pain ? ?86 y.o. 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 ? ?Assessment/Plan: ?ESRD - usually dialyzed Tues and Sat at [facility] on [address] last HD on Tuesday. No acute indication for RRT today and will resume on Sat but will assess daily. ?Left elbow fracture - appreciate vascular placing a RIJ TC and will need to wait till left arm is accessible before attempting dialysis. She's in a lot of pain and doubt we will be able to access for weeks. S/p ORIF left supracondylar humerus fracture on 4/19.  ?Renal osteodystrophy - will check a phos and manage binders ?Anemia - will check iron panel and dose ESA as needed. On Mircera as outpatient. 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  Followed at [facility] by Dr. Madelin Rear ?  ?HPI: Christina Mercado is an 86 y.o. 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 She usually dialyzes Tues and Thur with [facility] [place] [facility]. Unfortunately she tripped and fell sustaining a supracondylar distal humerus fracture s/p ORIF on 4/19. She actually had a RIJ TC placed by [facility] as the left upper arm access would not be accessible with the fracture + ORIF. She complaints mainly of left elbow/ arm pain this am but denies any weakness. She is extremely hard of hearing but denies fever, chills, nausea, vomiting or shortness of breath. ? ?ROS ?Pertinent items are noted in HPI. ? ?Chemistry and CBC: ?Creatinine, Ser  ?Date/Time Value Ref Range Status  ?03/30/2022 02:13 AM 5.33 (H) 0.44 - 1.00 mg/dL Final  ?03/29/2022 04:14 PM 4.49 (H) 0.44 - 1.00 mg/dL Final  ?03/29/2022 05:47 AM 4.10 (H) 0.44 - 1.00 mg/dL Final  ?03/28/2022 06:31 AM 6.46 (H) 0.44 - 1.00 mg/dL Final  ?08/12/2021 03:32 PM 7.32 (H) 0.44 - 1.00 mg/dL  Final  ?05/27/2021 03:38 PM 5.25 (H) 0.44 - 1.00 mg/dL Final  ?06/18/2020 01:37 PM 3.30 (H) 0.44 - 1.00 mg/dL Final  ?12/20/2018 02:30 PM 2.92 (H) 0.44 - 1.00 mg/dL Final  ?08/29/2018 04:03 PM 3.40 (H) 0.44 - 1.00 mg/dL Final  ?06/03/2018 04:33 AM 3.36 (H) 0.44 - 1.00 mg/dL Final  ?06/02/2018 01:30 AM 3.49 (H) 0.44 - 1.00 mg/dL Final  ?07/04/2016 04:20 AM 1.98 (H) 0.44 - 1.00 mg/dL Final  ?07/03/2016 05:38 AM 1.97 (H) 0.44 - 1.00 mg/dL Final  ?07/02/2016 08:31 AM 2.37 (H) 0.44 - 1.00 mg/dL Final  ? ?Recent Labs  ?Lab 03/28/22 ?0631 03/29/22 ?3235 03/29/22 ?1614 03/30/22 ?5732  ?NA 141 137 138 140  ?K 5.0 4.9 4.0 3.8  ?CL 100 99 100 103  ?CO2 '27 29 26 25  '$ ?GLUCOSE 141* 118* 137* 177*  ?BUN 54* 22 25* 37*  ?CREATININE 6.46* 4.10* 4.49* 5.33*  ?CALCIUM 9.3 9.3 9.1 8.6*  ?PHOS  --   --  5.8* 6.0*  ? ?Recent Labs  ?Lab 03/28/22 ?0631 03/29/22 ?2025  ?WBC 12.3* 9.3  ?NEUTROABS  --  5.7  ?HGB 10.8* 10.8*  ?HCT 34.4* 33.2*  ?MCV 95.3 96.5  ?PLT 242 195  ? ?Liver Function Tests: ?Recent Labs  ?Lab 03/29/22 ?4270 03/29/22 ?1614 03/30/22 ?6237  ?AST 18  --   --   ?ALT 12  --   --   ?ALKPHOS 65  --   --   ?BILITOT 0.6  --   --   ?PROT 5.9*  --   --   ?  ALBUMIN 3.0* 2.9* 2.6*  ? ?No results for input(s): LIPASE, AMYLASE in the last 168 hours. ?No results for input(s): AMMONIA in the last 168 hours. ?Cardiac Enzymes: ?No results for input(s): CKTOTAL, CKMB, CKMBINDEX, TROPONINI in the last 168 hours. ?Iron Studies: No results for input(s): IRON, TIBC, TRANSFERRIN, FERRITIN in the last 72 hours. ?PT/INR: ?'@LABRCNTIP'$ (inr:5) ? ?Xrays/Other Studies: ?) ?Results for orders placed or performed during the hospital encounter of 03/29/22 (from the past 48 hour(s))  ?Basic metabolic panel     Status: Abnormal  ? Collection Time: 03/29/22  5:47 AM  ?Result Value Ref Range  ? Sodium 137 135 - 145 mmol/L  ? Potassium 4.9 3.5 - 5.1 mmol/L  ? Chloride 99 98 - 111 mmol/L  ? CO2 29 22 - 32 mmol/L  ? Glucose, Bld 118 (H) 70 - 99 mg/dL  ?  Comment:  Glucose reference range applies only to samples taken after fasting for at least 8 hours.  ? BUN 22 8 - 23 mg/dL  ? Creatinine, Ser 4.10 (H) 0.44 - 1.00 mg/dL  ? Calcium 9.3 8.9 - 10.3 mg/dL  ? GFR, Estimated 10 (L) >60 mL/min  ?  Comment: (NOTE) ?Calculated using the CKD-EPI Creatinine Equation (2021) ?  ? Anion gap 9 5 - 15  ?  Comment: Performed at [facility], [place] [address], [place], [place] [address]  ?CBC with Differential/Platelet     Status: Abnormal  ? Collection Time: 03/29/22  5:47 AM  ?Result Value Ref Range  ? WBC 9.3 4.0 - 10.5 K/uL  ? RBC 3.44 (L) 3.87 - 5.11 MIL/uL  ? Hemoglobin 10.8 (L) 12.0 - 15.0 g/dL  ? HCT 33.2 (L) 36.0 - 46.0 %  ? MCV 96.5 80.0 - 100.0 fL  ? MCH 31.4 26.0 - 34.0 pg  ? MCHC 32.5 30.0 - 36.0 g/dL  ? RDW 15.2 11.5 - 15.5 %  ? Platelets 195 150 - 400 K/uL  ? nRBC 0.0 0.0 - 0.2 %  ? Neutrophils Relative % 61 %  ? Neutro Abs 5.7 1.7 - 7.7 K/uL  ? Lymphocytes Relative 22 %  ? Lymphs Abs 2.0 0.7 - 4.0 K/uL  ? Monocytes Relative 15 %  ? Monocytes Absolute 1.4 (H) 0.1 - 1.0 K/uL  ? Eosinophils Relative 1 %  ? Eosinophils Absolute 0.1 0.0 - 0.5 K/uL  ? Basophils Relative 1 %  ? Basophils Absolute 0.1 0.0 - 0.1 K/uL  ? Immature Granulocytes 0 %  ? Abs Immature Granulocytes 0.04 0.00 - 0.07 K/uL  ?  Comment: Performed at [facility], [place] [address], [place], [place] [address]  ?Hepatic function panel     Status: Abnormal  ? Collection Time: 03/29/22  5:47 AM  ?Result Value Ref Range  ? Total Protein 5.9 (L) 6.5 - 8.1 g/dL  ? Albumin 3.0 (L) 3.5 - 5.0 g/dL  ? AST 18 15 - 41 U/L  ? ALT 12 0 - 44 U/L  ? Alkaline Phosphatase 65 38 - 126 U/L  ? Total Bilirubin 0.6 0.3 - 1.2 mg/dL  ? Bilirubin, Direct 0.2 0.0 - 0.2 mg/dL  ? Indirect Bilirubin 0.4 0.3 - 0.9 mg/dL  ?  Comment: Performed at [facility], [place] [address], [place], [place] [address]  ?Hemoglobin A1c     Status: None  ? Collection Time: 03/29/22  5:47 AM  ?Result Value Ref Range  ? Hgb A1c MFr Bld 5.6 4.8 - 5.6 %  ?  Comment: (NOTE) ?Pre diabetes:          5.7%-6.4% ? ?Diabetes:              >6.4% ? ?Glycemic control for   <7.0% ?adults with diabetes ?  ? Mean Plasma Glucose 114.02 mg/dL  ?  Comment: Performed at [facility], [place] [address] [place], [place] [address]  ?Glucose, capillary     Status: Abnormal  ? Collection Time: 03/29/22  7:57 AM  ?Result Value Ref Range  ? Glucose-Capillary 124 (H) 70 - 99 mg/dL  ?  Comment: Glucose reference range applies only to samples taken after fasting for at least 8 hours.  ?Glucose, capillary     Status: Abnormal  ? Collection Time: 03/29/22  9:50 AM  ?Result Value Ref Range  ? Glucose-Capillary 117 (H) 70 - 99 mg/dL  ?  Comment: Glucose reference range applies only to samples taken after fasting for at least 8 hours.  ? Comment 1 Notify RN   ? Comment 2 Document in Chart   ?Surgical pcr screen     Status: None  ? Collection Time: 03/29/22  9:55 AM  ? Specimen: Nasal Mucosa; Nasal Swab  ?Result Value Ref Range  ? MRSA, PCR NEGATIVE NEGATIVE  ? Staphylococcus aureus NEGATIVE NEGATIVE  ?  Comment: (NOTE) ?The Xpert SA Assay (FDA approved for NASAL specimens in patients 2 ?years of age and older), is one component of a comprehensive ?surveillance program. It is not intended to diagnose infection nor to ?guide or monitor treatment. ?Performed at [facility], [place] [address] [place], [place] ?[address] ?  ?Glucose, capillary     Status: None  ? Collection Time: 03/29/22 12:50 PM  ?Result Value Ref Range  ? Glucose-Capillary 93 70 - 99 mg/dL  ?  Comment: Glucose reference range applies only to samples taken after fasting for at least 8 hours.  ? Comment 1 Notify RN   ? Comment 2 Document in Chart   ?Renal function panel (daily at 1600)     Status: Abnormal  ? Collection Time: 03/29/22  4:14 PM  ?Result Value Ref Range  ? Sodium 138 135 - 145 mmol/L  ? Potassium 4.0 3.5 - 5.1 mmol/L  ?  Comment: DELTA CHECK NOTED  ? Chloride 100 98 - 111 mmol/L  ? CO2 26 22 - 32 mmol/L  ?  Glucose, Bld 137 (H) 70 - 99 mg/dL  ?  Comment: Glucose reference range applies only to samples taken after fasting for at least 8 hours.  ? BUN 25 (H) 8 - 23 mg/dL  ? Creatinine, Ser 4.49 (H) 0.44 - 1.00 mg

## 2022-03-30 NOTE — Progress Notes (Signed)
Physical Therapy Treatment ?Patient Details ?Name: Christina Mercado ?MRN: [id number] ?DOB: 1936-02-26 ?Today's Date: 03/30/2022 ? ? ?History of Present Illness 86 y/o female presented to ED on 03/27/22 following fall. Sustained L displaced supracondylar fx. S/p permacath in R internal jugular vein on 4/18. S/p ORIF L humerus fx on 4/19. PMH: HTN, Diabetes, ESRD ? ?  ?PT Comments  ? ? Patient able to progress OOB this date with minA and HHA. Patient needing encouragement to attempt mobility this date due to fear of pain and falling. Once up in chair, patient appreciative of mobilizing. Positioned L arm in an elevated position to assist with managing swelling and placed ice pack on arm. Patient motivated to get better so she can return to her apartment at [place]. Continue to recommend SNF for ongoing Physical Therapy.    ?   ?Recommendations for follow up therapy are one component of a multi-disciplinary discharge planning process, led by the attending physician.  Recommendations may be updated based on patient status, additional functional criteria and insurance authorization. ? ?Follow Up Recommendations ? Skilled nursing-short term rehab (<3 hours/day) ?  ?  ?Assistance Recommended at Discharge Frequent or constant Supervision/Assistance  ?Patient can return home with the following A little help with walking and/or transfers;A little help with bathing/dressing/bathroom ?  ?Equipment Recommendations ? Other (comment) (TBD)  ?  ?Recommendations for Other Services   ? ? ?  ?Precautions / Restrictions Precautions ?Precautions: Fall ?Restrictions ?Weight Bearing Restrictions: Yes ?LUE Weight Bearing: Non weight bearing  ?  ? ?Mobility ? Bed Mobility ?Overal bed mobility: Needs Assistance ?Bed Mobility: Supine to Sit ?  ?  ?Supine to sit: Mod assist ?  ?  ?General bed mobility comments: modA for trunk elevation and repositioning hips towards EOB ?  ? ?Transfers ?Overall transfer level: Needs assistance ?Equipment used: 1 person  hand held assist ?Transfers: Sit to/from Stand, Bed to chair/wheelchair/BSC ?Sit to Stand: Min assist ?  ?Step pivot transfers: Min assist ?  ?  ?  ?General transfer comment: minA to rise from EOB. Able to take steps with L foot but difficulty advancing R LE so shuffling on floor to advance. MinA for balance throughout ?  ? ?Ambulation/Gait ?  ?  ?  ?  ?  ?  ?  ?  ? ? ?Stairs ?  ?  ?  ?  ?  ? ? ?Wheelchair Mobility ?  ? ?Modified Rankin (Stroke Patients Only) ?  ? ? ?  ?Balance Overall balance assessment: Needs assistance ?Sitting-balance support: No upper extremity supported, Feet supported ?Sitting balance-Leahy Scale: Fair ?  ?  ?Standing balance support: Single extremity supported, During functional activity ?Standing balance-Leahy Scale: Poor ?Standing balance comment: reliant on UE support and minA ?  ?  ?  ?  ?  ?  ?  ?  ?  ?  ?  ?  ? ?  ?Cognition Arousal/Alertness: Awake/alert ?Behavior During Therapy: [facility] for tasks assessed/performed ?Overall Cognitive Status: No family/caregiver present to determine baseline cognitive functioning ?  ?  ?  ?  ?  ?  ?  ?  ?  ?  ?  ?  ?  ?  ?  ?  ?General Comments: seems WFL but intermittent STM deficits during session ?  ?  ? ?  ?Exercises   ? ?  ?General Comments   ?  ?  ? ?Pertinent Vitals/Pain Pain Assessment ?Pain Assessment: Faces ?Faces Pain Scale: Hurts even more ?Pain Location: L arm ?Pain Descriptors /  Indicators: Grimacing ?Pain Intervention(s): Monitored during session  ? ? ?Home Living   ?  ?  ?  ?  ?  ?  ?  ?  ?  ?   ?  ?Prior Function    ?  ?  ?   ? ?PT Goals (current goals can now be found in the care plan section) Acute Rehab PT Goals ?Patient Stated Goal: to go to rehab ?PT Goal Formulation: With patient ?Time For Goal Achievement: 04/12/22 ?Potential to Achieve Goals: Good ?Progress towards PT goals: Progressing toward goals ? ?  ?Frequency ? ? ? Min 3X/week ? ? ? ?  ?PT Plan Current plan remains appropriate  ? ? ?Co-evaluation   ?  ?  ?  ?  ? ?  ?AM-PAC  PT "6 Clicks" Mobility   ?Outcome Measure ? Help needed turning from your back to your side while in a flat bed without using bedrails?: A Lot ?Help needed moving from lying on your back to sitting on the side of a flat bed without using bedrails?: A Lot ?Help needed moving to and from a bed to a chair (including a wheelchair)?: A Little ?Help needed standing up from a chair using your arms (e.g., wheelchair or bedside chair)?: A Little ?Help needed to walk in hospital room?: Total ?Help needed climbing 3-5 steps with a railing? : Total ?6 Click Score: 12 ? ?  ?End of Session Equipment Utilized During Treatment: Gait belt ?Activity Tolerance: Patient tolerated treatment well ?Patient left: in chair;with call bell/phone within reach;with chair alarm set ?Nurse Communication: Mobility status ?PT Visit Diagnosis: Unsteadiness on feet (R26.81);Muscle weakness (generalized) (M62.81);History of falling (Z91.81);Other abnormalities of gait and mobility (R26.89) ?  ? ? ?Time: 0092-3300 ?PT Time Calculation (min) (ACUTE ONLY): 49 min ? ?Charges:  $Therapeutic Activity: 38-52 mins          ?          ? ?Tasnim Balentine A. Gilford Rile, PT, DPT ?Acute Rehabilitation Services ?Pager [phone] ?Office [phone] ? ? ? ?Adaeze Better A Abie Cheek ?03/30/2022, 4:29 PM ? ?

## 2022-03-30 NOTE — Progress Notes (Signed)
?PROGRESS NOTE ? ? ? ?Christina Mercado  DGL:[id number] DOB: Feb 01, 1936 DOA: 03/29/2022 ?PCP: Myrtie Soman, MD  ? ?Brief Narrative: 86 year old female transferred from [facility] with left elbow fracture and for Ortho consult.  She has a history of end-stage renal disease on dialysis type 2 diabetes, hypertension dm and htn ? ? ?Assessment & Plan: ?  ?Principal Problem: ?  Left elbow fracture ?Active Problems: ?  ESRD on dialysis [facility]) ?  Acquired hypothyroidism ?  Rheumatoid arthritis, seropositive ([place]) ?  DM (diabetes mellitus), type 2 with renal complications ([place]) ? ? ?#1 left elbow fracture status post ORIF of the left supracondylar humerus fracture ?Heparin for DVT prophylaxis ?Oxycodone and Tylenol for pain control ?Left upper extremity nonweightbearing ?Ortho following ?Seen by physical therapy recommending SNF ? ?#2 ESRD on dialysis twice a week Tuesday and Saturday  ?She had dialysis at [facility] 03/28/2022  ?nephrology following ? ?#3 type 2 diabetes-continue SSI ?CBG (last 3)  ?Recent Labs  ?  03/30/22 ?0359 03/30/22 ?0734 03/30/22 ?1152  ?GLUCAP 150* 90 179*  ? ? ? ?#4 history of essential hypertension bp soft  ?On norvasc which is on hold  ? ?#5 klebsiella oxytoca e coli-continue rocephin ? ? ?Estimated body mass index is 22.89 kg/m? as calculated from the following: ?  Height as of this encounter: '5\' 5"'$  (1.651 m). ?  Weight as of this encounter: 62.4 kg. ? ?DVT prophylaxis:heparin ?Code Status:full ?Family Communication: none ?Disposition Plan:  Status is: Inpatient ?Remains inpatient appropriate because: elbow fracture esrd ?  ?Consultants:  ?Renal ?ortho ? ?Procedures:none ?Antimicrobials:rocephin ? ?Subjective: ?Patient is resting in bed she complains of severe pain to her left upper extremity ? ?Objective: ?Vitals:  ? 03/29/22 1647 03/29/22 2015 03/30/22 0451 03/30/22 0944  ?BP: (!) 122/59 135/88 (!) 132/54 (!) 106/57  ?Pulse: 97 99 (!) 105 (!) 109  ?Resp: '15 18 16 18  '$ ?Temp:  98.3 ?F (36.8 ?C) 97.9 ?F (36.6 ?C)  98.3 ?F (36.8 ?C)  ?TempSrc:  Oral  Oral  ?SpO2: 97% 99% 98% 97%  ?Weight:   62.4 kg   ?Height:      ? ? ?Intake/Output Summary (Last 24 hours) at 03/30/2022 1156 ?Last data filed at 03/30/2022 0900 ?Gross per 24 hour  ?Intake 860 ml  ?Output 300 ml  ?Net 560 ml  ? ?Filed Weights  ? 03/30/22 0451  ?Weight: 62.4 kg  ? ? ?Examination: ? ?General exam: Appears calm and comfortable  ?Respiratory system: Clear to auscultation. Respiratory effort normal. ?Cardiovascular system: S1 & S2 heard, RRR. No JVD, murmurs, rubs, gallops or clicks. No pedal edema. ?Gastrointestinal system: Abdomen is nondistended, soft and nontender. No organomegaly or masses felt. Normal bowel sounds heard. ?Central nervous system: Alert and oriented. No focal neurological deficits. ?Extremities: Left upper extremity covered with dressing ?Skin: No rashes, lesions or ulcers ?Psychiatry: Judgement and insight appear normal. Mood & affect appropriate.  ? ? ? ?Data Reviewed: I have personally reviewed following labs and imaging studies ? ?CBC: ?Recent Labs  ?Lab 03/28/22 ?0631 03/29/22 ?5188  ?WBC 12.3* 9.3  ?NEUTROABS  --  5.7  ?HGB 10.8* 10.8*  ?HCT 34.4* 33.2*  ?MCV 95.3 96.5  ?PLT 242 195  ? ?Basic Metabolic Panel: ?Recent Labs  ?Lab 03/28/22 ?0631 03/29/22 ?4166 03/29/22 ?1614 03/30/22 ?0630  ?NA 141 137 138 140  ?K 5.0 4.9 4.0 3.8  ?CL 100 99 100 103  ?CO2 '27 29 26 25  '$ ?GLUCOSE 141* 118* 137* 177*  ?BUN 54* 22 25* 37*  ?  CREATININE 6.46* 4.10* 4.49* 5.33*  ?CALCIUM 9.3 9.3 9.1 8.6*  ?MG  --   --   --  1.6*  ?PHOS  --   --  5.8* 6.0*  ? ?GFR: ?Estimated Creatinine Clearance: 7.2 mL/min (A) (by C-G formula based on SCr of 5.33 mg/dL (H)). ?Liver Function Tests: ?Recent Labs  ?Lab 03/29/22 ?5465 03/29/22 ?1614 03/30/22 ?6812  ?AST 18  --   --   ?ALT 12  --   --   ?ALKPHOS 65  --   --   ?BILITOT 0.6  --   --   ?PROT 5.9*  --   --   ?ALBUMIN 3.0* 2.9* 2.6*  ? ?No results for input(s): LIPASE, AMYLASE in the last 168 hours. ?No results for input(s):  AMMONIA in the last 168 hours. ?Coagulation Profile: ?No results for input(s): INR, PROTIME in the last 168 hours. ?Cardiac Enzymes: ?No results for input(s): CKTOTAL, CKMB, CKMBINDEX, TROPONINI in the last 168 hours. ?BNP (last 3 results) ?No results for input(s): PROBNP in the last 8760 hours. ?HbA1C: ?Recent Labs  ?  03/29/22 ?7517  ?HGBA1C 5.6  ? ?CBG: ?Recent Labs  ?Lab 03/29/22 ?2015 03/30/22 ?0005 03/30/22 ?0017 03/30/22 ?4944 03/30/22 ?1152  ?GLUCAP 117* 137* 150* 90 179*  ? ?Lipid Profile: ?No results for input(s): CHOL, HDL, LDLCALC, TRIG, CHOLHDL, LDLDIRECT in the last 72 hours. ?Thyroid Function Tests: ?No results for input(s): TSH, T4TOTAL, FREET4, T3FREE, THYROIDAB in the last 72 hours. ?Anemia Panel: ?No results for input(s): VITAMINB12, FOLATE, FERRITIN, TIBC, IRON, RETICCTPCT in the last 72 hours. ?Sepsis Labs: ?Recent Labs  ?Lab 03/28/22 ?9675 03/28/22 ?0719  ?PROCALCITON  --  <0.10  ?LATICACIDVEN 1.4  --   ? ? ?Recent Results (from the past 240 hour(s))  ?Urine Culture     Status: Abnormal  ? Collection Time: 03/28/22  1:16 AM  ? Specimen: Urine, Clean Catch  ?Result Value Ref Range Status  ? Specimen Description   Final  ?  URINE, CLEAN CATCH ?Performed at [facility], [address] [place], [place] [address] ?  ? Special Requests   Final  ?  NONE ?Performed at [facility], [address] [place], [place] [address] ?  ? Culture >=100,000 COLONIES/mL KLEBSIELLA OXYTOCA (A)  Final  ? Report Status 03/30/2022 FINAL  Final  ? Organism ID, Bacteria KLEBSIELLA OXYTOCA (A)  Final  ?    Susceptibility  ? Klebsiella oxytoca - MIC*  ?  AMPICILLIN RESISTANT Resistant   ?  CEFAZOLIN <=4 SENSITIVE Sensitive   ?  CEFEPIME <=0.12 SENSITIVE Sensitive   ?  CEFTRIAXONE <=0.25 SENSITIVE Sensitive   ?  CIPROFLOXACIN <=0.25 SENSITIVE Sensitive   ?  GENTAMICIN <=1 SENSITIVE Sensitive   ?  IMIPENEM <=0.25 SENSITIVE Sensitive   ?  NITROFURANTOIN 32 SENSITIVE Sensitive   ?  TRIMETH/SULFA <=20  SENSITIVE Sensitive   ?  AMPICILLIN/SULBACTAM 4 SENSITIVE Sensitive   ?  PIP/TAZO <=4 SENSITIVE Sensitive   ?  * >=100,000 COLONIES/mL KLEBSIELLA OXYTOCA  ?Resp Panel by RT-PCR (Flu A&B, Covid) Nasopharyngeal Swab     Status: None  ? Collection Time: 03/28/22  6:31 AM  ? Specimen: Nasopharyngeal Swab; Nasopharyngeal(NP) swabs in vial transport medium  ?Result Value Ref Range Status  ? SARS Coronavirus 2 by RT PCR NEGATIVE NEGATIVE Final  ?  Comment: (NOTE) ?SARS-CoV-2 target nucleic acids are NOT DETECTED. ? ?The SARS-CoV-2 RNA is generally detectable in upper respiratory ?specimens during the acute phase of infection. The lowest ?concentration  of SARS-CoV-2 viral copies this assay can detect is ?138 copies/mL. A negative result does not preclude SARS-Cov-2 ?infection and should not be used as the sole basis for treatment or ?other patient management decisions. A negative result may occur with  ?improper specimen collection/handling, submission of specimen other ?than nasopharyngeal swab, presence of viral mutation(s) within the ?areas targeted by this assay, and inadequate number of viral ?copies(<138 copies/mL). A negative result must be combined with ?clinical observations, patient history, and epidemiological ?information. The expected result is Negative. ? ?Fact Sheet for Patients:  ?EntrepreneurPulse.com.au ? ?Fact Sheet for Healthcare Providers:  ?IncredibleEmployment.be ? ?This test is no t yet approved or cleared by the [place] FDA and  ?has been authorized for detection and/or diagnosis of SARS-CoV-2 by ?FDA under an Emergency Use Authorization (EUA). This EUA will remain  ?in effect (meaning this test can be used) for the duration of the ?COVID-19 declaration under Section 564(b)(1) of the Act, 21 ?U.S.C.section 360bbb-3(b)(1), unless the authorization is terminated  ?or revoked sooner.  ? ? ?  ? Influenza A by PCR NEGATIVE NEGATIVE Final  ? Influenza B by PCR  NEGATIVE NEGATIVE Final  ?  Comment: (NOTE) ?The Xpert Xpress SARS-CoV-2/FLU/RSV plus assay is intended as an aid ?in the diagnosis of influenza from Nasopharyngeal swab specimens and ?should not be used as a s

## 2022-03-31 DIAGNOSIS — S42402A Unspecified fracture of lower end of left humerus, initial encounter for closed fracture: Secondary | ICD-10-CM | POA: Diagnosis not present

## 2022-03-31 LAB — RENAL FUNCTION PANEL
Albumin: 2.5 g/dL — ABNORMAL LOW (ref 3.5–5.0)
Albumin: 2.5 g/dL — ABNORMAL LOW (ref 3.5–5.0)
Anion gap: 11 (ref 5–15)
Anion gap: 15 (ref 5–15)
BUN: 56 mg/dL — ABNORMAL HIGH (ref 8–23)
BUN: 65 mg/dL — ABNORMAL HIGH (ref 8–23)
CO2: 22 mmol/L (ref 22–32)
CO2: 24 mmol/L (ref 22–32)
Calcium: 8.6 mg/dL — ABNORMAL LOW (ref 8.9–10.3)
Calcium: 8.5 mg/dL — ABNORMAL LOW (ref 8.9–10.3)
Chloride: 102 mmol/L (ref 98–111)
Chloride: 98 mmol/L (ref 98–111)
Creatinine, Ser: 6.25 mg/dL — ABNORMAL HIGH (ref 0.44–1.00)
Creatinine, Ser: 6.86 mg/dL — ABNORMAL HIGH (ref 0.44–1.00)
GFR, Estimated: 6 mL/min — ABNORMAL LOW (ref >60)
GFR, Estimated: 6 mL/min — ABNORMAL LOW (ref >60)
Glucose, Bld: 101 mg/dL — ABNORMAL HIGH (ref 70–99)
Glucose, Bld: 163 mg/dL — ABNORMAL HIGH (ref 70–99)
Phosphorus: 7.5 mg/dL — ABNORMAL HIGH (ref 2.5–4.6)
Phosphorus: 7 mg/dL — ABNORMAL HIGH (ref 2.5–4.6)
Potassium: 4 mmol/L (ref 3.5–5.1)
Potassium: 3.8 mmol/L (ref 3.5–5.1)
Sodium: 135 mmol/L (ref 135–145)
Sodium: 137 mmol/L (ref 135–145)

## 2022-03-31 LAB — MAGNESIUM: Magnesium: 1.8 mg/dL (ref 1.7–2.4)

## 2022-03-31 LAB — CBC
HCT: 29.4 % — ABNORMAL LOW (ref 36.0–46.0)
Hemoglobin: 9.3 g/dL — ABNORMAL LOW (ref 12.0–15.0)
MCH: 30.8 pg (ref 26.0–34.0)
MCHC: 31.6 g/dL (ref 30.0–36.0)
MCV: 97.4 fL (ref 80.0–100.0)
Platelets: 165 10*3/uL (ref 150–400)
RBC: 3.02 MIL/uL — ABNORMAL LOW (ref 3.87–5.11)
RDW: 14.9 % (ref 11.5–15.5)
WBC: 8.6 10*3/uL (ref 4.0–10.5)
nRBC: 0 % (ref 0.0–0.2)

## 2022-03-31 LAB — GLUCOSE, CAPILLARY
Glucose-Capillary: 109 mg/dL — ABNORMAL HIGH (ref 70–99)
Glucose-Capillary: 131 mg/dL — ABNORMAL HIGH (ref 70–99)
Glucose-Capillary: 147 mg/dL — ABNORMAL HIGH (ref 70–99)
Glucose-Capillary: 151 mg/dL — ABNORMAL HIGH (ref 70–99)
Glucose-Capillary: 158 mg/dL — ABNORMAL HIGH (ref 70–99)
Glucose-Capillary: 98 mg/dL (ref 70–99)

## 2022-03-31 MED ORDER — SEVELAMER CARBONATE 800 MG PO TABS
800.0000 mg | ORAL_TABLET | Freq: Three times a day (TID) | ORAL | Status: DC
  Administered 2022-03-31 – 2022-04-01 (×4): 800 mg via ORAL
  Filled 2022-03-31 (×4): qty 1

## 2022-03-31 MED ORDER — HYDROXYZINE HCL 10 MG PO TABS
10.0000 mg | ORAL_TABLET | Freq: Three times a day (TID) | ORAL | Status: DC | PRN
  Administered 2022-03-31 – 2022-04-01 (×2): 10 mg via ORAL
  Filled 2022-03-31 (×2): qty 1

## 2022-03-31 NOTE — Progress Notes (Signed)
Physical Therapy Treatment ?Patient Details ?Name: Christina Mercado ?MRN: [id number] ?DOB: 02/20/1936 ?Today's Date: 03/31/2022 ? ? ?History of Present Illness 86 y/o female presented to ED on 03/27/22 following fall. Sustained L displaced supracondylar fx. S/p permacath in R internal jugular vein on 4/18. S/p ORIF L humerus fx on 4/19. PMH: HTN, Diabetes, ESRD ? ?  ?PT Comments  ? ? Patient progressing towards therapy goals. Patient able to progress ambulation distance with forward stepping but keeps R knee flexed and reports "it's due to left leg being shorter" despite weakness in R LE. Cognition overall WFL but does demo STM deficits and decreased awareness of situation. Overall patient requires minA+2 for OOB mobility with HHA on R. Continue to recommend SNF for ongoing Physical Therapy.    ?   ?Recommendations for follow up therapy are one component of Christina multi-disciplinary discharge planning process, led by the attending physician.  Recommendations may be updated based on patient status, additional functional criteria and insurance authorization. ? ?Follow Up Recommendations ? Skilled nursing-short term rehab (<3 hours/day) ?  ?  ?Assistance Recommended at Discharge Frequent or constant Supervision/Assistance  ?Patient can return home with the following Christina little help with walking and/or transfers;Christina little help with bathing/dressing/bathroom ?  ?Equipment Recommendations ? Other (comment) (TBD)  ?  ?Recommendations for Other Services   ? ? ?  ?Precautions / Restrictions Precautions ?Precautions: Fall ?Restrictions ?Weight Bearing Restrictions: Yes ?LUE Weight Bearing: Non weight bearing  ?  ? ?Mobility ? Bed Mobility ?Overal bed mobility: Needs Assistance ?Bed Mobility: Supine to Sit ?  ?  ?Supine to sit: Mod assist ?  ?  ?General bed mobility comments: modA for trunk elevation and repositioning hips towards EOB, pt has Christina harder time scooting her L hip forward due to not being able to use her LUE ?   ? ?Transfers ?Overall transfer level: Needs assistance ?Equipment used: 2 person hand held assist ?Transfers: Sit to/from Stand, Bed to chair/wheelchair/BSC ?Sit to Stand: Min assist ?  ?Step pivot transfers: Min assist, +2 safety/equipment, +2 physical assistance ?  ?  ?  ?General transfer comment: Min Christina +2 to take steps forwards and to the side, heavily reliant on HHA on RUE, quick to fatigue and require Christina seated rest break. ?  ? ?Ambulation/Gait ?  ?  ?  ?  ?  ?  ?  ?  ? ? ?Stairs ?  ?  ?  ?  ?  ? ? ?Wheelchair Mobility ?  ? ?Modified Rankin (Stroke Patients Only) ?  ? ? ?  ?Balance Overall balance assessment: Needs assistance ?Sitting-balance support: No upper extremity supported, Feet supported ?Sitting balance-Leahy Scale: Fair ?Sitting balance - Comments: unable to accept challenge to balance ?  ?Standing balance support: Single extremity supported, During functional activity ?Standing balance-Leahy Scale: Poor ?Standing balance comment: reliant on UE support and minA ?  ?  ?  ?  ?  ?  ?  ?  ?  ?  ?  ?  ? ?  ?Cognition Arousal/Alertness: Awake/alert ?Behavior During Therapy: [facility] for tasks assessed/performed ?Overall Cognitive Status: No family/caregiver present to determine baseline cognitive functioning ?  ?  ?  ?  ?  ?  ?  ?  ?  ?  ?  ?  ?  ?  ?  ?  ?General Comments: seems WFL but intermittent STM deficits during session ?  ?  ? ?  ?Exercises Other Exercises ?Other Exercises: hand pumps x10 ?Other Exercises: Wrist ext/flexion  x10 ?Other Exercises: elbow extension x5 ?Other Exercises: Elbow flexion x5 (onlyable to tolerate to 90 degrees) ? ?  ?General Comments General comments (skin integrity, edema, etc.): VSS on RA, PA came in and removed bandages during session, pt then bled through new bandage and RN placed gauze over it. ?  ?  ? ?Pertinent Vitals/Pain Pain Assessment ?Pain Assessment: Faces ?Faces Pain Scale: Hurts even more ?Pain Location: L arm and neck ?Pain Descriptors / Indicators: Grimacing,  Discomfort, Moaning ?Pain Intervention(s): Monitored during session, Ice applied, Heat applied (ice to arm and heat to neck)  ? ? ?Home Living   ?  ?  ?  ?  ?  ?  ?  ?  ?  ?   ?  ?Prior Function    ?  ?  ?   ? ?PT Goals (current goals can now be found in the care plan section) Acute Rehab PT Goals ?PT Goal Formulation: With patient ?Time For Goal Achievement: 04/12/22 ?Potential to Achieve Goals: Good ?Progress towards PT goals: Progressing toward goals ? ?  ?Frequency ? ? ? Min 3X/week ? ? ? ?  ?PT Plan Current plan remains appropriate  ? ? ?Co-evaluation PT/OT/SLP Co-Evaluation/Treatment: Yes ?Reason for Co-Treatment: For patient/therapist safety;To address functional/ADL transfers ?PT goals addressed during session: Mobility/safety with mobility;Balance ?OT goals addressed during session: Strengthening/ROM;ADL's and self-care ?  ? ?  ?AM-PAC PT "6 Clicks" Mobility   ?Outcome Measure ? Help needed turning from your back to your side while in Christina flat bed without using bedrails?: Christina Lot ?Help needed moving from lying on your back to sitting on the side of Christina flat bed without using bedrails?: Christina Lot ?Help needed moving to and from Christina bed to Christina chair (including Christina wheelchair)?: Christina Little ?Help needed standing up from Christina chair using your arms (e.g., wheelchair or bedside chair)?: Christina Little ?Help needed to walk in hospital room?: Total ?Help needed climbing 3-5 steps with Christina railing? : Total ?6 Click Score: 12 ? ?  ?End of Session Equipment Utilized During Treatment: Gait belt ?Activity Tolerance: Patient tolerated treatment well ?Patient left: in chair;with call bell/phone within reach;with chair alarm set ?Nurse Communication: Mobility status ?PT Visit Diagnosis: Unsteadiness on feet (R26.81);Muscle weakness (generalized) (M62.81);History of falling (Z91.81);Other abnormalities of gait and mobility (R26.89) ?  ? ? ?Time: 1224-8250 ?PT Time Calculation (min) (ACUTE ONLY): 34 min ? ?Charges:  $Gait Training: 8-22 mins           ?          ? ?Christina Mercado Christina. Gilford Rile, PT, DPT ?Acute Rehabilitation Services ?Pager [phone] ?Office [phone] ? ? ? ?Christina Mercado Christina Mercado ?03/31/2022, 12:45 PM ? ?

## 2022-03-31 NOTE — Progress Notes (Signed)
?[facility] ?Progress Note  ? ?86 y.o. 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 She usually dialyzes Tues and Thur with [facility] [place] [facility]. Unfortunately she tripped and fell sustaining a supracondylar distal humerus fracture s/p ORIF on 4/19. She actually had a RIJ TC placed by [facility] as the left upper arm access would not be accessible with the fracture + ORIF.  ? ?Assessment/ Plan:   ?ESRD - usually dialyzed Tues and Sat at [facility] on [address] last HD on Tuesday. No acute indication for RRT today and will resume on Sat ?Left elbow fracture - appreciate vascular placing a RIJ TC and will need to wait till left arm is accessible before attempting dialysis. She's in a lot of pain and doubt we will be able to access for weeks. S/p ORIF left supracondylar humerus fracture on 4/19.  ?Renal osteodystrophy - will check a phos -> 7.5 start on renvela 1 tab TIDM ?Anemia - will check iron panel and dose ESA as needed. On Mircera as outpatient. 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  Followed at [facility] by Dr. Madelin Rear ? ?Subjective:   ?Still c/o left elbow pain but a little better than yest. ?Denies f/c/n/ sob  ? ?Objective:   ?BP (!) 142/74 (BP Location: Right Arm)   Pulse (!) 107   Temp 98 ?F (36.7 ?C) (Oral)   Resp 17   Ht '5\' 5"'$  (1.651 m)   Wt 62.4 kg   SpO2 97%   BMI 22.89 kg/m?  ? ?Intake/Output Summary (Last 24 hours) at 03/31/2022 0743 ?Last data filed at 03/31/2022 0700 ?Gross per 24 hour  ?Intake 574 ml  ?Output 1150 ml  ?Net -576 ml  ? ?Weight change:  ? ?Physical Exam: ?GEN: NAD, A&Ox3, NCAT ?HEENT: No conjunctival pallor, EOMI ?NECK: Supple, no thyromegaly ?LUNGS: CTA B/L no rales, rhonchi or wheezing ?CV: RRR, No M/R/G ?ABD: SNDNT +BS  ?EXT: No lower extremity edema ?ACCESS: lt arm + bruit even thru bandages, RIJ TC ? ? ? ?Imaging: ?DG Elbow 2 Views Left ? ?Result Date:  03/29/2022 ?CLINICAL DATA:  Fracture, postoperative EXAM: LEFT ELBOW - 2 VIEW COMPARISON:  03/28/2022 FINDINGS: Status post interval plate and screw fixation of a previously noted left distal humerus fracture. The fracture components appear to be in near anatomic alignment. No perihardware lucency hardware fracture, or perihardware fracture. Air within the soft tissues is not unexpected post surgically. IMPRESSION: Expected postoperative appearance, status post ORIF of a left distal humerus fracture. Electronically Signed   By: Merilyn Baba M.D.   On: 03/29/2022 13:57  ? ?DG ELBOW COMPLETE LEFT (3+VIEW) ? ?Result Date: 03/29/2022 ?CLINICAL DATA:  Intraoperative imaging of fracture fixation EXAM: LEFT ELBOW - COMPLETE 3+ VIEW COMPARISON:  Yesterday FINDINGS: Multiple intraoperative images which demonstrate placement of 2 plate and screw fixation devices across the previously described distal humerus fracture. Alignment is significantly improved. One of the plates is identified medially i and n the other is identified posteriorly. IMPRESSION: Intraoperative imaging of distal humerus fixation. Electronically Signed   By: Abigail Miyamoto M.D.   On: 03/29/2022 12:38  ? ?DG C-Arm 1-60 Min-No Report ? ?Result Date: 03/29/2022 ?Fluoroscopy was utilized by the requesting physician.  No radiographic interpretation.   ? ?Labs: ?BMET ?Recent Labs  ?Lab 03/28/22 ?0631 03/29/22 ?7096 03/29/22 ?1614 03/30/22 ?2836 03/30/22 ?1637 03/31/22 ?0420  ?NA 141 137 138 140 138 135  ?K 5.0 4.9 4.0 3.8 4.4 4.0  ?  CL 100 99 100 103 100 102  ?CO2 '27 29 26 25 25 22  '$ ?GLUCOSE 141* 118* 137* 177* 124* 101*  ?BUN 54* 22 25* 37* 51* 56*  ?CREATININE 6.46* 4.10* 4.49* 5.33* 5.92* 6.25*  ?CALCIUM 9.3 9.3 9.1 8.6* 8.6* 8.6*  ?PHOS  --   --  5.8* 6.0* 6.3* 7.5*  ? ?CBC ?Recent Labs  ?Lab 03/28/22 ?0631 03/29/22 ?4034 03/31/22 ?0420  ?WBC 12.3* 9.3 8.6  ?NEUTROABS  --  5.7  --   ?HGB 10.8* 10.8* 9.3*  ?HCT 34.4* 33.2* 29.4*  ?MCV 95.3 96.5 97.4  ?PLT 242 195  165  ? ? ?Medications:   ? ? acetaminophen  650 mg Oral Q6H  ? Chlorhexidine Gluconate Cloth  6 each Topical Daily  ? cholecalciferol  2,000 Units Oral BH-q7a  ? docusate sodium  100 mg Oral BID  ? heparin injection (subcutaneous)  5,000 Units Subcutaneous Q8H  ? insulin aspart  0-6 Units Subcutaneous Q4H  ? ? ? ? ?Otelia Santee, MD ?03/31/2022, 7:43 AM  ? ?

## 2022-03-31 NOTE — Progress Notes (Signed)
Orthopaedic Trauma Progress Note ? ?[place] ok today. Reports intermittent, moderate pain about operative site.  Medications helping some.  Currently working with therapies.  No chest pain. No SOB. No nausea/vomiting. No other complaints.  ? ?OBJECTIVE:  ?Vitals:  ? 03/31/22 0415 03/31/22 0913  ?BP: (!) 142/74 112/63  ?Pulse: (!) 107 (!) 108  ?Resp: 17 18  ?Temp: 98 ?F (36.7 ?C) 97.9 ?F (36.6 ?C)  ?SpO2: 97% 95%  ? ? ?General: Sitting up on edge of bed, NAD ?Respiratory: No increased work of breathing.  ?Left upper extremity: Sling in place, remove this for dressing change.  Dressings were removed and incision with minimal serosanguineous drainage.  New Aquacel dressing applied to posterior elbow.  Swelling to the fingers. Tenderness about the elbow as expected. Non-tender in shoulder or throughout forearm. Motor and sensory function intact through median, ulnar, radial nerve distribution. +radial pulse ? ?IMAGING: Stable post op imaging.  ? ?LABS:  ?Results for orders placed or performed during the hospital encounter of 03/29/22 (from the past 24 hour(s))  ?Glucose, capillary     Status: Abnormal  ? Collection Time: 03/30/22 11:52 AM  ?Result Value Ref Range  ? Glucose-Capillary 179 (H) 70 - 99 mg/dL  ?Glucose, capillary     Status: Abnormal  ? Collection Time: 03/30/22  4:17 PM  ?Result Value Ref Range  ? Glucose-Capillary 123 (H) 70 - 99 mg/dL  ?Renal function panel (daily at 1600)     Status: Abnormal  ? Collection Time: 03/30/22  4:37 PM  ?Result Value Ref Range  ? Sodium 138 135 - 145 mmol/L  ? Potassium 4.4 3.5 - 5.1 mmol/L  ? Chloride 100 98 - 111 mmol/L  ? CO2 25 22 - 32 mmol/L  ? Glucose, Bld 124 (H) 70 - 99 mg/dL  ? BUN 51 (H) 8 - 23 mg/dL  ? Creatinine, Ser 5.92 (H) 0.44 - 1.00 mg/dL  ? Calcium 8.6 (L) 8.9 - 10.3 mg/dL  ? Phosphorus 6.3 (H) 2.5 - 4.6 mg/dL  ? Albumin 2.7 (L) 3.5 - 5.0 g/dL  ? GFR, Estimated 7 (L) >60 mL/min  ? Anion gap 13 5 - 15  ?Glucose, capillary     Status: Abnormal  ?  Collection Time: 03/30/22  8:36 PM  ?Result Value Ref Range  ? Glucose-Capillary 169 (H) 70 - 99 mg/dL  ?Glucose, capillary     Status: Abnormal  ? Collection Time: 03/30/22 11:54 PM  ?Result Value Ref Range  ? Glucose-Capillary 139 (H) 70 - 99 mg/dL  ?Glucose, capillary     Status: None  ? Collection Time: 03/31/22  4:13 AM  ?Result Value Ref Range  ? Glucose-Capillary 98 70 - 99 mg/dL  ?Renal function panel (daily at 0500)     Status: Abnormal  ? Collection Time: 03/31/22  4:20 AM  ?Result Value Ref Range  ? Sodium 135 135 - 145 mmol/L  ? Potassium 4.0 3.5 - 5.1 mmol/L  ? Chloride 102 98 - 111 mmol/L  ? CO2 22 22 - 32 mmol/L  ? Glucose, Bld 101 (H) 70 - 99 mg/dL  ? BUN 56 (H) 8 - 23 mg/dL  ? Creatinine, Ser 6.25 (H) 0.44 - 1.00 mg/dL  ? Calcium 8.6 (L) 8.9 - 10.3 mg/dL  ? Phosphorus 7.5 (H) 2.5 - 4.6 mg/dL  ? Albumin 2.5 (L) 3.5 - 5.0 g/dL  ? GFR, Estimated 6 (L) >60 mL/min  ? Anion gap 11 5 - 15  ?CBC     Status: Abnormal  ?  Collection Time: 03/31/22  4:20 AM  ?Result Value Ref Range  ? WBC 8.6 4.0 - 10.5 K/uL  ? RBC 3.02 (L) 3.87 - 5.11 MIL/uL  ? Hemoglobin 9.3 (L) 12.0 - 15.0 g/dL  ? HCT 29.4 (L) 36.0 - 46.0 %  ? MCV 97.4 80.0 - 100.0 fL  ? MCH 30.8 26.0 - 34.0 pg  ? MCHC 31.6 30.0 - 36.0 g/dL  ? RDW 14.9 11.5 - 15.5 %  ? Platelets 165 150 - 400 K/uL  ? nRBC 0.0 0.0 - 0.2 %  ?Magnesium     Status: None  ? Collection Time: 03/31/22  4:20 AM  ?Result Value Ref Range  ? Magnesium 1.8 1.7 - 2.4 mg/dL  ?Glucose, capillary     Status: Abnormal  ? Collection Time: 03/31/22  7:32 AM  ?Result Value Ref Range  ? Glucose-Capillary 109 (H) 70 - 99 mg/dL  ? ? ?ASSESSMENT: Christina Mercado is a 86 y.o. female, 2 Days Post-Op s/p ?ORIF LEFT SUPRACONDYLAR DISTAL HUMERUS FRACTURE ? ?CV/Blood loss: Hemoglobin 9.3 this AM. Hemodynamically stable ? ?PLAN: ?Weightbearing: NWB LUE ?ROM: Okay for unrestricted elbow range of motion ?Incisional and dressing care: Leave Aquacel in place ?Showering: Ok to shower, Aquacel may get  wet ?Orthopedic device(s):  Sling LUE for comfort   ?Pain management:  ?1. Tylenol 650 mg q 6 hours scheduled ?3. Oxycodone 5-10 mg q 4 hours PRN ?VTE prophylaxis:  Per primary team.  No DVT prophylaxis required from Ortho standpoint. SCDs ?ID: Currently on ceftriaxone for UTI, no additional ABX needed ?Foley/Lines:  No foley, KVO IVFs ?Impediments to Fracture Healing: Vitamin D level 57.  Patient on 2000 units vitamin D3 at baseline, no additional supplementation indicated. ?Dispo: Therapies as tolerated.  Patient will require SNF prior to returning to independent living facility.  Patient ok for d/c from ortho standpoint ? ?D/C recommendations: ?- Home dose Oxycodone 5 to 10 mg for pain control ?- DVT prophylaxis at the discretion of primary team ?- No additional Vit D supplementation indicated ? ?Follow - up plan: 2 weeks after discharge for repeat x-rays left elbow and wound check ? ? ?Contact information:  Katha Hamming MD, Rushie Nyhan PA-C. After hours and holidays please check [email or web address] for group call information for [facility] ? ? ?Gwinda Passe, PA-C ?[phone] (office) ?[email or web address] ? ? ? ?

## 2022-03-31 NOTE — Progress Notes (Signed)
Guaze and ace wrap reapplied to left arm due to bleeding ?

## 2022-03-31 NOTE — TOC Progression Note (Addendum)
Transition of Care (TOC) - Initial/Assessment Note  ? ? ?Patient Details  ?Name: Christina Mercado ?MRN: [id number] ?Date of Birth: 03-14-1936 ? ?Transition of Care (TOC) CM/SW Contact:    ?Paulene Floor Agustina Witzke, LCSWA ?Phone Number: ?03/31/2022, 10:15 AM ? ?Clinical Narrative:                 ?CSW emailed bed offers to the patient's son ([email or web address]), per his request, and is awaiting a response.   ? ?11:24- Family is requesting inpatient rehab.  PT and attending MD notified. ? ?Expected Discharge Plan: [place] ?Barriers to Discharge: Barriers Resolved ? ? ?Patient Goals and CMS Choice ?  ?CMS Medicare.gov Compare Post Acute Care list provided to:: Patient Represenative (must comment) ?  ? ?Expected Discharge Plan and Services ?Expected Discharge Plan: [place] ?  ?  ?  ?Living arrangements for the past 2 months: [place] ?                ?  ?  ?  ?  ?  ?  ?  ?  ?  ?  ? ?Prior Living Arrangements/Services ?Living arrangements for the past 2 months: [place] ?Lives with:: Facility Resident ?Patient language and need for interpreter reviewed:: Yes ?Do you feel safe going back to the place where you live?: Yes      ?Need for Family Participation in Patient Care: Yes (Comment) ?Care giver support system in place?: Yes (comment) ?  ?Criminal Activity/Legal Involvement Pertinent to Current Situation/Hospitalization: No - Comment as needed ? ?Activities of Daily Living ?  ?  ? ?Permission Sought/Granted ?  ?Permission granted to share information with : Yes, Verbal Permission Granted ? Share Information with NAME: Corina Stacy, son ? Permission granted to share info w AGENCY: SNF ?   ?   ? ?Emotional Assessment ?Appearance:: Appears stated age ?Attitude/Demeanor/Rapport: Inconsistent ?Affect (typically observed): Adaptable ?Orientation: : Fluctuating Orientation (Suspected and/or reported Sundowners) ?Alcohol / Substance Use: Not Applicable ?Psych  Involvement: No (comment) ? ?Admission diagnosis:  Left elbow fracture [S42.402A] ?Patient Active Problem List  ? Diagnosis Date Noted  ? DM (diabetes mellitus), type 2 with renal complications ([place]) 03/83/3383  ? Left elbow fracture 03/29/2022  ? Elbow fracture, left 03/28/2022  ? Multiple myeloma ([place]) 02/16/2022  ? ESRD on dialysis ([place]) 02/16/2022  ? Tricompartment osteoarthritis of knees (Bilateral) 02/16/2022  ? Osteoarthritis of knees (Bilateral) 02/16/2022  ? Osteoarthritis of knee (Right) 02/16/2022  ? Osteoarthritis involving multiple joints 02/16/2022  ? DDD (degenerative disc disease), cervical 02/16/2022  ? Anterolisthesis of cervical spine (C4/C5 and C7/T1) 02/16/2022  ? Protrusion of cervical intervertebral disc (C3-4) 02/16/2022  ? Chronic rotator cuff arthropathy of shoulder (Right) 02/16/2022  ? Superior shoulder subluxation, sequela (Right) 02/16/2022  ? Osteoarthritis of AC (acromioclavicular) joint (Right) 02/16/2022  ? Osteoarthritis of glenohumeral joint (Right) 02/16/2022  ? Osteoarthritis of glenohumeral joint (Left) 02/16/2022  ? Shoulder subluxation, sequela (Left) 02/16/2022  ? Osteoarthritis of hip (Left) 02/16/2022  ? Effusion of knee joints (Bilateral) 02/16/2022  ? Hip joint effusion (Left) 02/16/2022  ? Osteopenia determined by x-ray 02/16/2022  ? Patellofemoral arthritis of knee (Left) 02/16/2022  ? Chronic shoulder pain (2ry area of Pain) (Bilateral) (L>R) 02/06/2022  ? Abnormal CT scan, cervical spine (02/26/2019) 02/06/2022  ? Chronic pain syndrome 02/05/2022  ? Pharmacologic therapy 02/05/2022  ? Disorder of skeletal system 02/05/2022  ? Problems influencing health status 02/05/2022  ? Acquired hypothyroidism 02/02/2022  ? Pelvic relaxation due  to uterovaginal prolapse, incomplete 02/02/2022  ? End stage renal disease ([place]) 02/02/2022  ? Neuropathy due to chemotherapeutic drug ([place]) 01/05/2021  ? Encounter regarding vascular access for dialysis for ESRD ([place]) 01/05/2021  ?  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 Recurrent major depressive disorder, in partial remission ([place]) 05/11/2020  ? Closed fracture of femur, intertrochanteric, sequela (Left) 11/11/2018  ? Chronic rotator cuff arthropathy of shoulder (Left) 11/11/2018  ? Chronic knee pain (1ry area of Pain) (Bilateral) (L>R) 06/18/2018  ? CKD (chronic kidney disease), stage V ([place]) 06/02/2018  ? Inability to ambulate due to multiple joints 06/02/2018  ? Hypertension secondary to other renal disorders 06/22/2017  ? Secondary hyperparathyroidism ([place]) 01/31/2017  ? 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  ? 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 Leukocytosis 07/04/2016  ? E-coli UTI 07/04/2016  ? Essential hypertension, malignant 07/04/2016  ? Pyelonephritis 07/02/2016  ? Hematuria 07/02/2016  ? Abdominal pain 07/02/2016  ? Acute on chronic renal failure ([place]) 07/02/2016  ? Rheumatoid arthritis, seropositive ([place]) 03/06/2016  ? Onychogryphosis 06/29/2015  ? Onychomycosis due to dermatophyte 06/29/2015  ? Bradycardia, sinus 05/03/2015  ? Premature contractions, atrial 05/03/2015  ? Anemia 01/21/2013  ? Chronic kidney disease, stage V (very severe) ([place]) 09/18/2012  ? Chronic pain disorder 07/16/2012  ? History of total hip replacement (Right) 07/08/2012  ? Osteoarthritis of knee (Left) 04/10/2012  ? Constipation 03/13/2012  ? Bilateral lower extremity edema 02/08/2012  ? Cataracts, bilateral 12/28/2011  ? Hyperlipidemia with target LDL less than 100 12/28/2011  ? Osteoporosis, post-menopausal 12/28/2011  ? ?PCP:  Myrtie Soman, MD ?Pharmacy:   ?[facility] ?[place] [address] ?Phone: [phone] Fax: [phone] ? ?[facility] ?[address] ?[place] [address] ?Phone: [phone] Fax: [phone] ? ? ? ? ?Social Determinants of Health (SDOH)  Interventions ?  ? ?Readmission Risk Interventions ?   ? View : No data to display.  ?  ?  ?  ? ? ? ?

## 2022-03-31 NOTE — PMR Pre-admission (Signed)
PMR Admission Coordinator Pre-Admission Assessment ? ?Patient: Christina Mercado is an 86 y.o., female ?MRN: [id number] ?DOB: 25-Aug-1936 ?Height: '5\' 5"'$  (165.1 cm) ?Weight: 65.4 kg ? ?Insurance Information ?HMO:     PPO:      PCP:      IPA:      80/20: yes     OTHER:  ?PRIMARY: Medicare A & B      Policy#: [id number]      Subscriber: patient ?CM Name:       Phone#:      Fax#:  ?Pre-Cert#:       Employer:  ?Benefits:  Phone #: verified eligibility via [employer] on 03/31/22     Name:  ?Eff. Date: Part A & B effective 03/12/03     Deduct: $1,600      Out of Pocket Max: NA      Life Max: NA ?CIR: 100% coverage      SNF: 100% coverage days 1-20, 80% coverage days 21-100 ?Outpatient: 80% coverage     Co-Pay: 20% ?Home Health: 100% coverage      Co-Pay:  ?DME: 80% coverage     Co-Pay: 20% ?Providers: pt's choice ?SECONDARY: AARP      Policy#: [id number]     Phone#: [phone] ? ?Financial Counselor:       Phone#:  ? ?The ?Data Collection Information Summary? for patients in Inpatient Rehabilitation Facilities with attached ?Privacy Act [place] Records? was provided and verbally reviewed with: Patient and Family ? ?Emergency Contact Information ?Contact Information   ? ? Name Relation Home Work Mobile  ? Novelo,William Son [phone]  [phone]  ? Larmon,Robin Daughter [phone]    ? ?  ? ? ?Current Medical History  ?Patient Admitting Diagnosis: left elbow fx, s/p ORIF left humerus fx ?History of Present Illness: Pt is an 86 year old female with medical hx significant for: rheumatoid arthritis, HTN, DM, ESRD from light chain disease on HD on Tuesday and Saturday. Pt [facility] after a fall. Pt c/o left elbow pain with swelling and pt did hit head. CT head and cervical spine showed no traumatic injury. Left elbow x-ray showed displaced supracondylar fx. Left longarm splint applied. Urinalysis was positive for UTI. ?Pt's HD access is just proximal to her fx at left elbow. Vascular surgery  placed permacath in right internal jugular vein on 03/28/22. Pt transferred to [facility] on 03/29/22. Pt underwent ORIF left humerus fx. Therapy evaluations completed and follow up skilled therapy recommended d/t pt's deficits in functional mobility and ability to complete ADLs independently.  ?  ? ?Patient's medical record from [facility] has been reviewed by the rehabilitation admission coordinator and physician. ? ?Past Medical History  ?Past Medical History:  ?Diagnosis Date  ? Arthritis   ? Diabetes mellitus without complication ([place])   ? Hypertension   ? Kidney failure   ? stage 3  ? Proteinuria   ? ? ?Has the patient had major surgery during 100 days prior to admission? Yes ? ?Family History   ?family history is not on file. ? ?Current Medications ? ?Current Facility-Administered Medications:  ?  0.9 %  sodium chloride infusion, 100 mL, Intravenous, PRN, Dwana Melena, MD ?  0.9 %  sodium chloride infusion, 100 mL, Intravenous, PRN, Dwana Melena, MD ?  acetaminophen (TYLENOL) tablet 650 mg, 650 mg, Oral, Q6H, Corinne Ports, PA-C, 650 mg at 04/01/22 0636 ?  alteplase (CATHFLO ACTIVASE) injection 2 mg, 2 mg, Intracatheter,  Once PRN, Dwana Melena, MD ?  cefTRIAXone (ROCEPHIN) 1 g in sodium chloride 0.9 % 100 mL IVPB, 1 g, Intravenous, Q24H, Corinne Ports, PA-C, Last Rate: 200 mL/hr at 03/31/22 0648, 1 g at 03/31/22 0648 ?  Chlorhexidine Gluconate Cloth 2 % PADS 6 each, 6 each, Topical, Daily, Georgette Shell, MD, 6 each at 03/31/22 1716 ?  cholecalciferol (VITAMIN D) tablet 2,000 Units, 2,000 Units, Oral, Glean Salen, PA-C, 2,000 Units at 04/01/22 4098 ?  docusate sodium (COLACE) capsule 100 mg, 100 mg, Oral, BID, Corinne Ports, PA-C, 100 mg at 03/31/22 2152 ?  heparin injection 1,000 Units, 1,000 Units, Dialysis, PRN, Dwana Melena, MD ?  heparin injection 5,000 Units, 5,000 Units, Subcutaneous, Q8H, Georgette Shell, MD, 5,000 Units at 04/01/22 1191 ?  hydrALAZINE  (APRESOLINE) injection 10 mg, 10 mg, Intravenous, Q4H PRN, Corinne Ports, PA-C ?  hydrOXYzine (ATARAX) tablet 10 mg, 10 mg, Oral, TID PRN, Hosie Poisson, MD, 10 mg at 04/01/22 0656 ?  insulin aspart (novoLOG) injection 0-6 Units, 0-6 Units, Subcutaneous, Q4H, Corinne Ports, PA-C, 1 Units at 03/31/22 1722 ?  lidocaine (PF) (XYLOCAINE) 1 % injection 5 mL, 5 mL, Intradermal, PRN, Dwana Melena, MD ?  lidocaine-prilocaine (EMLA) cream 1 application., 1 application., Topical, PRN, Dwana Melena, MD ?  melatonin tablet 5 mg, 5 mg, Oral, QHS PRN, Howerter, Justin B, DO, 5 mg at 03/31/22 2151 ?  metoCLOPramide (REGLAN) tablet 5-10 mg, 5-10 mg, Oral, Q8H PRN **OR** metoCLOPramide (REGLAN) injection 5-10 mg, 5-10 mg, Intravenous, Q8H PRN, Corinne Ports, PA-C, 10 mg at 03/31/22 2043 ?  ondansetron (ZOFRAN) tablet 4 mg, 4 mg, Oral, Q6H PRN **OR** ondansetron (ZOFRAN) injection 4 mg, 4 mg, Intravenous, Q6H PRN, Corinne Ports, PA-C, 4 mg at 03/31/22 1830 ?  oxyCODONE (Oxy IR/ROXICODONE) immediate release tablet 10 mg, 10 mg, Oral, QID PRN, Corinne Ports, PA-C, 10 mg at 03/31/22 2151 ?  oxyCODONE (Oxy IR/ROXICODONE) immediate release tablet 5 mg, 5 mg, Oral, QHS PRN, Corinne Ports, PA-C, 5 mg at 03/31/22 0032 ?  pentafluoroprop-tetrafluoroeth (GEBAUERS) aerosol 1 application., 1 application., Topical, PRN, Dwana Melena, MD ?  polyethylene glycol (MIRALAX / GLYCOLAX) packet 17 g, 17 g, Oral, Daily PRN, Corinne Ports, PA-C ?  sevelamer carbonate (RENVELA) tablet 800 mg, 800 mg, Oral, TID WC, Dwana Melena, MD, 800 mg at 03/31/22 1722 ? ?Patients Current Diet:  ?Diet Order   ? ?       ?  Diet Carb Modified Fluid consistency: Thin; Room service appropriate? Yes  Diet effective now       ?  ? ?  ?  ? ?  ? ? ?Precautions / Restrictions ?Precautions ?Precautions: Fall ?Restrictions ?Weight Bearing Restrictions: Yes ?LUE Weight Bearing: Non weight bearing  ? ?Has the patient had 2 or more falls or a fall with injury in  the past year? Yes ? ?Prior Activity Level ?Limited Community (1-2x/wk): at least 3 days/week for dialysis ? ?Prior Functional Level ?Self Care: Did the patient need help bathing, dressing, using the toilet or eating? Independent ? ?Indoor Mobility: Did the patient need assistance with walking from room to room (with or without device)? Independent ? ?Stairs: Did the patient need assistance with internal or external stairs (with or without device)? Dependent ? ?Functional Cognition: Did the patient need help planning regular tasks such as shopping or remembering to take medications? Needed some help ? ?Patient Information ?Are  you of Hispanic, Latino/a,or Spanish origin?: A. No, not of Hispanic, Latino/a, or Spanish origin ?What is your race?: A. White ?Do you need or want an interpreter to communicate with a doctor or health care staff?: 0. No ? ?Patient's Response To:  ?Health Literacy and Transportation ?Is the patient able to respond to health literacy and transportation needs?: Yes ?Health Literacy - How often do you need to have someone help you when you read instructions, pamphlets, or other written material from your doctor or pharmacy?: Always (son reads medical material for pt) ?In the past 12 months, has lack of transportation kept you from medical appointments or from getting medications?: No ?In the past 12 months, has lack of transportation kept you from meetings, work, or from getting things needed for daily living?: No ? ?Home Assistive Devices / Equipment ?Home Equipment: Conservation officer, nature (2 wheels), Shower seat - built in ? ?Prior Device Use: Indicate devices/aids used by the patient prior to current illness, exacerbation or injury? Walker ? ?Current Functional Level ?Cognition ? Overall Cognitive Status: No family/caregiver present to determine baseline cognitive functioning ?Orientation Level: Oriented to person ?General Comments: seems WFL but intermittent STM deficits during session ?    ?Extremity Assessment ?(includes Sensation/Coordination) ? Upper Extremity Assessment: Generalized weakness, LUE deficits/detail ?LUE Deficits / Details: Elbow fracture, no ROM limits, no WB ?LUE Sensation: WNL ?LU

## 2022-03-31 NOTE — Progress Notes (Signed)
?PROGRESS NOTE ? ? ? ?Christina Mercado  ZOX:[id number] DOB: 05/31/1936 DOA: 03/29/2022 ?PCP: Myrtie Soman, MD  ? ?Brief Narrative: 86 year old female transferred from [facility] with left elbow fracture and for Ortho consult.  She has a history of end-stage renal disease on dialysis type 2 diabetes, hypertension dm and htn ? ? ?Assessment & Plan: ?  ?Principal Problem: ?  Left elbow fracture ?Active Problems: ?  ESRD on dialysis [facility]) ?  Acquired hypothyroidism ?  Rheumatoid arthritis, seropositive ([place]) ?  DM (diabetes mellitus), type 2 with renal complications ([place]) ? ? ?#1 left elbow fracture status post ORIF of the left supracondylar humerus fracture ?Heparin for DVT prophylaxis ?Oxycodone and Tylenol for pain control ?Left upper extremity nonweightbearing ?Ortho following ?Seen by physical therapy recommending SNF ?Await snf ?She is requesting cir consult  ? ?#2 ESRD on dialysis twice a week Tuesday and Saturday  ?She had dialysis at [facility] 03/28/2022  ?nephrology following ? ?#3 type 2 diabetes-continue SSI ?CBG (last 3)  ?Recent Labs  ?  03/31/22 ?0413 03/31/22 ?0732 03/31/22 ?1150  ?GLUCAP 98 109* 151*  ? ? ? ?#4 history of essential hypertension bp soft  ?On norvasc which is on hold  ? ?#5 klebsiella oxytoca e coli-continue rocephin ? ? ?Estimated body mass index is 22.89 kg/m? as calculated from the following: ?  Height as of this encounter: '5\' 5"'$  (1.651 m). ?  Weight as of this encounter: 62.4 kg. ? ?DVT prophylaxis:heparin ?Code Status:full ?Family Communication: none ?Disposition Plan:  Status is: Inpatient ?Remains inpatient appropriate because: elbow fracture esrd ?  ?Consultants:  ?Renal ?ortho ? ?Procedures:none ?Antimicrobials:rocephin ? ?Subjective: ? ?Requesting CIR consult  ? ?Objective: ?Vitals:  ? 03/30/22 2107 03/31/22 0014 03/31/22 0415 03/31/22 0913  ?BP: 130/67 (!) 143/68 (!) 142/74 112/63  ?Pulse: (!) 110 (!) 110 (!) 107 (!) 108  ?Resp: '18 19 17 18  '$ ?Temp: 98.8 ?F (37.1 ?C) 98.6 ?F (37 ?C) 98 ?F (36.7  ?C) 97.9 ?F (36.6 ?C)  ?TempSrc: Oral Oral Oral Oral  ?SpO2: 99% 100% 97% 95%  ?Weight:      ?Height:      ? ? ?Intake/Output Summary (Last 24 hours) at 03/31/2022 1318 ?Last data filed at 03/31/2022 1219 ?Gross per 24 hour  ?Intake 517 ml  ?Output 1150 ml  ?Net -633 ml  ? ? ?Filed Weights  ? 03/30/22 0451  ?Weight: 62.4 kg  ? ? ?Examination: ? ?General exam: Appears in mild distress   ?Respiratory system: Clear to auscultation. Respiratory effort normal. ?Cardiovascular system: S1 & S2 heard, RRR. No JVD, murmurs, rubs, gallops or clicks. No pedal edema. ?Gastrointestinal system: Abdomen is nondistended, soft and nontender. No organomegaly or masses felt. Normal bowel sounds heard. ?Central nervous system: Alert and oriented. No focal neurological deficits. ?Extremities: Left upper extremity covered with dressing ?Skin: No rashes, lesions or ulcers ?Psychiatry: Judgement and insight appear normal. Mood & affect appropriate.  ? ? ? ?Data Reviewed: I have personally reviewed following labs and imaging studies ? ?CBC: ?Recent Labs  ?Lab 03/28/22 ?0631 03/29/22 ?8119 03/31/22 ?0420  ?WBC 12.3* 9.3 8.6  ?NEUTROABS  --  5.7  --   ?HGB 10.8* 10.8* 9.3*  ?HCT 34.4* 33.2* 29.4*  ?MCV 95.3 96.5 97.4  ?PLT 242 195 165  ? ? ?Basic Metabolic Panel: ?Recent Labs  ?Lab 03/29/22 ?1478 03/29/22 ?1614 03/30/22 ?2956 03/30/22 ?1637 03/31/22 ?0420  ?NA 137 138 140 138 135  ?K 4.9 4.0 3.8 4.4 4.0  ?CL 99 100 103 100 102  ?  CO2 '29 26 25 25 22  '$ ?GLUCOSE 118* 137* 177* 124* 101*  ?BUN 22 25* 37* 51* 56*  ?CREATININE 4.10* 4.49* 5.33* 5.92* 6.25*  ?CALCIUM 9.3 9.1 8.6* 8.6* 8.6*  ?MG  --   --  1.6*  --  1.8  ?PHOS  --  5.8* 6.0* 6.3* 7.5*  ? ? ?GFR: ?Estimated Creatinine Clearance: 6.1 mL/min (A) (by C-G formula based on SCr of 6.25 mg/dL (H)). ?Liver Function Tests: ?Recent Labs  ?Lab 03/29/22 ?4403 03/29/22 ?1614 03/30/22 ?4742 03/30/22 ?1637 03/31/22 ?0420  ?AST 18  --   --   --   --   ?ALT 12  --   --   --   --   ?ALKPHOS 65  --   --   --    --   ?BILITOT 0.6  --   --   --   --   ?PROT 5.9*  --   --   --   --   ?ALBUMIN 3.0* 2.9* 2.6* 2.7* 2.5*  ? ? ?No results for input(s): LIPASE, AMYLASE in the last 168 hours. ?No results for input(s): AMMONIA in the last 168 hours. ?Coagulation Profile: ?No results for input(s): INR, PROTIME in the last 168 hours. ?Cardiac Enzymes: ?No results for input(s): CKTOTAL, CKMB, CKMBINDEX, TROPONINI in the last 168 hours. ?BNP (last 3 results) ?No results for input(s): PROBNP in the last 8760 hours. ?HbA1C: ?Recent Labs  ?  03/29/22 ?5956  ?HGBA1C 5.6  ? ? ?CBG: ?Recent Labs  ?Lab 03/30/22 ?2036 03/30/22 ?2354 03/31/22 ?0413 03/31/22 ?0732 03/31/22 ?1150  ?GLUCAP 169* 139* 98 109* 151*  ? ? ?Lipid Profile: ?No results for input(s): CHOL, HDL, LDLCALC, TRIG, CHOLHDL, LDLDIRECT in the last 72 hours. ?Thyroid Function Tests: ?No results for input(s): TSH, T4TOTAL, FREET4, T3FREE, THYROIDAB in the last 72 hours. ?Anemia Panel: ?No results for input(s): VITAMINB12, FOLATE, FERRITIN, TIBC, IRON, RETICCTPCT in the last 72 hours. ?Sepsis Labs: ?Recent Labs  ?Lab 03/28/22 ?3875 03/28/22 ?0719  ?PROCALCITON  --  <0.10  ?LATICACIDVEN 1.4  --   ? ? ? ?Recent Results (from the past 240 hour(s))  ?Urine Culture     Status: Abnormal  ? Collection Time: 03/28/22  1:16 AM  ? Specimen: Urine, Clean Catch  ?Result Value Ref Range Status  ? Specimen Description   Final  ?  URINE, CLEAN CATCH ?Performed at [facility], [address] [place], [place] [address] ?  ? Special Requests   Final  ?  NONE ?Performed at [facility], [address], [place], [place] [address] ?  ? Culture >=100,000 COLONIES/mL KLEBSIELLA OXYTOCA (A)  Final  ? Report Status 03/30/2022 FINAL  Final  ? Organism ID, Bacteria KLEBSIELLA OXYTOCA (A)  Final  ?    Susceptibility  ? Klebsiella oxytoca - MIC*  ?  AMPICILLIN RESISTANT Resistant   ?  CEFAZOLIN <=4 SENSITIVE Sensitive   ?  CEFEPIME <=0.12 SENSITIVE Sensitive   ?  CEFTRIAXONE <=0.25  SENSITIVE Sensitive   ?  CIPROFLOXACIN <=0.25 SENSITIVE Sensitive   ?  GENTAMICIN <=1 SENSITIVE Sensitive   ?  IMIPENEM <=0.25 SENSITIVE Sensitive   ?  NITROFURANTOIN 32 SENSITIVE Sensitive   ?  TRIMETH/SULFA <=20 SENSITIVE Sensitive   ?  AMPICILLIN/SULBACTAM 4 SENSITIVE Sensitive   ?  PIP/TAZO <=4 SENSITIVE Sensitive   ?  * >=100,000 COLONIES/mL KLEBSIELLA OXYTOCA  ?Resp Panel by RT-PCR (Flu A&B, Covid) Nasopharyngeal Swab     Status: None  ? Collection Time:  03/28/22  6:31 AM  ? Specimen: Nasopharyngeal Swab; Nasopharyngeal(NP) swabs in vial transport medium  ?Result Value Ref Range Status  ? SARS Coronavirus 2 by RT PCR NEGATIVE NEGATIVE Final  ?  Comment: (NOTE) ?SARS-CoV-2 target nucleic acids are NOT DETECTED. ? ?The SARS-CoV-2 RNA is generally detectable in upper respiratory ?specimens during the acute phase of infection. The lowest ?concentration of SARS-CoV-2 viral copies this assay can detect is ?138 copies/mL. A negative result does not preclude SARS-Cov-2 ?infection and should not be used as the sole basis for treatment or ?other patient management decisions. A negative result may occur with  ?improper specimen collection/handling, submission of specimen other ?than nasopharyngeal swab, presence of viral mutation(s) within the ?areas targeted by this assay, and inadequate number of viral ?copies(<138 copies/mL). A negative result must be combined with ?clinical observations, patient history, and epidemiological ?information. The expected result is Negative. ? ?Fact Sheet for Patients:  ?EntrepreneurPulse.com.au ? ?Fact Sheet for Healthcare Providers:  ?IncredibleEmployment.be ? ?This test is no t yet approved or cleared by the [place] FDA and  ?has been authorized for detection and/or diagnosis of SARS-CoV-2 by ?FDA under an Emergency Use Authorization (EUA). This EUA will remain  ?in effect (meaning this test can be used) for the duration of the ?COVID-19  declaration under Section 564(b)(1) of the Act, 21 ?U.S.C.section 360bbb-3(b)(1), unless the authorization is terminated  ?or revoked sooner.  ? ? ?  ? Influenza A by PCR NEGATIVE NEGATIVE Final  ? Influenza B by

## 2022-03-31 NOTE — Progress Notes (Signed)
Occupational Therapy Treatment ?Patient Details ?Name: Christina Mercado ?MRN: [id number] ?DOB: September 25, 1936 ?Today's Date: 03/31/2022 ? ? ?History of present illness 86 y/o female presented to ED on 03/27/22 following fall. Sustained L displaced supracondylar fx. S/p permacath in R internal jugular vein on 4/18. S/p ORIF L humerus fx on 4/19. PMH: HTN, Diabetes, ESRD ?  ?OT comments ? Pt making progress towards OT goals. This session focused on pt bed mobility, standing and transferring, as well as addressing LUE ROM. Pt continues to question why her arm hurts so much, she states that she knows she had surgery on it, but did not expect for it to hurt this bad. She is unable to flex her elbow past 90 degrees and extends it to approximately 150 degrees. For all functional mobility, including bed mobility and transfers, pt requiring min A +2 for balancing and support. Pt does well with NWB in LUE. OT continuing to recommend SNF to maximize her independence and safety prior to returning home. OT will follow acutely.   ? ?Recommendations for follow up therapy are one component of a multi-disciplinary discharge planning process, led by the attending physician.  Recommendations may be updated based on patient status, additional functional criteria and insurance authorization. ?   ?Follow Up Recommendations ? Skilled nursing-short term rehab (<3 hours/day)  ?  ?Assistance Recommended at Discharge Frequent or constant Supervision/Assistance  ?Patient can return home with the following ? A lot of help with walking and/or transfers;A lot of help with bathing/dressing/bathroom;Assistance with cooking/housework;Direct supervision/assist for medications management;Assist for transportation;Help with stairs or ramp for entrance ?  ?Equipment Recommendations ? Other (comment) (TBD)  ?  ?Recommendations for Other Services   ? ?  ?Precautions / Restrictions Precautions ?Precautions: Fall ?Restrictions ?Weight Bearing Restrictions: Yes ?LUE  Weight Bearing: Non weight bearing  ? ? ?  ? ?Mobility Bed Mobility ?Overal bed mobility: Needs Assistance ?Bed Mobility: Supine to Sit ?  ?  ?Supine to sit: Mod assist ?  ?  ?General bed mobility comments: modA for trunk elevation and repositioning hips towards EOB, pt has a harder time scooting her L hip forward due to not being able to use her LUE ?  ? ?Transfers ?Overall transfer level: Needs assistance ?Equipment used: 2 person hand held assist ?Transfers: Sit to/from Stand, Bed to chair/wheelchair/BSC ?Sit to Stand: Min assist ?  ?  ?Step pivot transfers: Min assist, +2 safety/equipment, +2 physical assistance ?  ?  ?General transfer comment: Min A +2 to take steps forwards and to the side, heavily reliant on HHA on RUE, quick to fatigue and require a seated rest break. ?  ?  ?Balance Overall balance assessment: Needs assistance ?Sitting-balance support: No upper extremity supported, Feet supported ?Sitting balance-Leahy Scale: Fair ?Sitting balance - Comments: unable to accept challenge to balance ?  ?Standing balance support: Single extremity supported, During functional activity ?Standing balance-Leahy Scale: Poor ?Standing balance comment: reliant on UE support and minA ?  ?  ?  ?  ?  ?  ?  ?  ?  ?  ?  ?   ? ?ADL either performed or assessed with clinical judgement  ? ?ADL Overall ADL's : Needs assistance/impaired ?  ?  ?  ?  ?  ?  ?  ?  ?  ?  ?  ?  ?Toilet Transfer: Moderate assistance;+2 for safety/equipment;+2 for physical assistance;Stand-pivot ?Toilet Transfer Details (indicate cue type and reason): Pt taking some steps, heavily reliant on HHA on the RUE, as she  can not bear weight onthe LUE. +2 for safety and balancing/steadying ?  ?  ?  ?  ?Functional mobility during ADLs: Moderate assistance;+2 for physical assistance;+2 for safety/equipment ?General ADL Comments: Pt fearful of falling and limited by pain in her LUE. Requiring support for balance ?  ? ?Extremity/Trunk Assessment   ?  ?  ?  ?  ?   ? ?Vision   ?  ?  ?Perception   ?  ?Praxis   ?  ? ?Cognition Arousal/Alertness: Awake/alert ?Behavior During Therapy: [facility] for tasks assessed/performed ?Overall Cognitive Status: No family/caregiver present to determine baseline cognitive functioning ?  ?  ?  ?  ?  ?  ?  ?  ?  ?  ?  ?  ?  ?  ?  ?  ?General Comments: seems WFL but intermittent STM deficits during session ?  ?  ?   ?Exercises Exercises: Other exercises ?Other Exercises ?Other Exercises: hand pumps x10 ?Other Exercises: Wrist ext/flexion x10 ?Other Exercises: elbow extension x5 ?Other Exercises: Elbow flexion x5 (onlyable to tolerate to 90 degrees) ? ?  ?Shoulder Instructions   ? ? ?  ?General Comments VSS on RA, PA came in and removed bandages during session, pt then bled through new bandage and RN placed gauze over it.  ? ? ?Pertinent Vitals/ Pain       Pain Assessment ?Pain Assessment: Faces ?Faces Pain Scale: Hurts even more ?Pain Location: L arm and neck ?Pain Descriptors / Indicators: Grimacing, Discomfort, Moaning ?Pain Intervention(s): Monitored during session, Repositioned, Ice applied, Heat applied ? ?Home Living   ?  ?  ?  ?  ?  ?  ?  ?  ?  ?  ?  ?  ?  ?  ?  ?  ?  ?  ? ?  ?Prior Functioning/Environment    ?  ?  ?  ?   ? ?Frequency ? Min 2X/week  ? ? ? ? ?  ?Progress Toward Goals ? ?OT Goals(current goals can now be found in the care plan section) ? Progress towards OT goals: Progressing toward goals ? ?Acute Rehab OT Goals ?Patient Stated Goal: To lessen pain and get stronger ?OT Goal Formulation: With patient ?Time For Goal Achievement: 04/12/22 ?Potential to Achieve Goals: Good ?ADL Goals ?Pt Will Perform Grooming: with modified independence;with adaptive equipment;standing ?Pt Will Transfer to Toilet: with modified independence;ambulating ?Pt Will Perform Toileting - Clothing Manipulation and hygiene: with modified independence;with adaptive equipment;sit to/from stand;sitting/lateral leans ?Additional ADL Goal #1: Pt will maintain NWB  through LUE 100% of the time, with no verbal cues.  ?Plan Discharge plan remains appropriate;Frequency remains appropriate   ? ?Co-evaluation ? ? ? PT/OT/SLP Co-Evaluation/Treatment: Yes ?Reason for Co-Treatment: To address functional/ADL transfers;For patient/therapist safety ?  ?OT goals addressed during session: Strengthening/ROM;ADL's and self-care ?  ? ?  ?AM-PAC OT "6 Clicks" Daily Activity     ?Outcome Measure ? ? Help from another person eating meals?: A Little ?Help from another person taking care of personal grooming?: A Little ?Help from another person toileting, which includes using toliet, bedpan, or urinal?: A Lot ?Help from another person bathing (including washing, rinsing, drying)?: A Lot ?Help from another person to put on and taking off regular upper body clothing?: A Little ?Help from another person to put on and taking off regular lower body clothing?: A Lot ?6 Click Score: 15 ? ?  ?End of Session   ? ?OT Visit Diagnosis: Unsteadiness on feet (R26.81);Other abnormalities  of gait and mobility (R26.89);Muscle weakness (generalized) (M62.81) ?  ?Activity Tolerance Patient tolerated treatment well ?  ?Patient Left in chair;with call bell/phone within reach;with chair alarm set ?  ?Nurse Communication Mobility status;Patient requests pain meds ?  ? ?   ? ?Time: 5170-0174 ?OT Time Calculation (min): 33 min ? ?Charges: OT General Charges ?$OT Visit: 1 Visit ?OT Treatments ?$Therapeutic Activity: 8-22 mins ? ?Namira Rosekrans H., OTR/L ?Acute Rehabilitation ? ?Kenady Doxtater Elane Yolanda Bonine ?03/31/2022, 10:43 AM ?

## 2022-03-31 NOTE — Progress Notes (Signed)
Got a called from cardiac monitoring that pt heart rate was 148 when they spoke to me the patients heart rate was at 106. Went to check on the pt she was working with therapy getting to the chair, during that time. Therapy didn't report nor assess any changes or distress in the pt during that time. Dr. Rodena Piety aware. Will continue with current plan of care.  ?

## 2022-03-31 NOTE — Discharge Instructions (Signed)
? ?Orthopaedic Trauma Service Discharge Instructions ? ? ?General Discharge Instructions ? ?WEIGHT BEARING STATUS:Non-weightbearing left upper extremity ? ?RANGE OF MOTION/ACTIVITY: ok for elbow motion as tolerated ? ?Wound Care: You ay remove Aquacel dressing. Incisions can be left open to air if there is no drainage. If incision continues to have drainage, follow wound care instructions below. Okay to shower if no drainage from incisions. ? ?DVT/PE prophylaxis:  ? ?Diet: as you were eating previously.  Can use over the counter stool softeners and bowel preparations, such as Miralax, to help with bowel movements.  Narcotics can be constipating.  Be sure to drink plenty of fluids ? ?PAIN MEDICATION USE AND EXPECTATIONS ? You have likely been given narcotic medications to help control your pain.  After a traumatic event that results in an fracture (broken bone) with or without surgery, it is ok to use narcotic pain medications to help control one's pain.  We understand that everyone responds to pain differently and each individual patient will be evaluated on a regular basis for the continued need for narcotic medications. Ideally, narcotic medication use should last no more than 6-8 weeks (coinciding with fracture healing).  ? As a patient it is your responsibility as well to monitor narcotic medication use and report the amount and frequency you use these medications when you come to your office visit.  ? We would also advise that if you are using narcotic medications, you should take a dose prior to therapy to maximize you participation. ? ?IF YOU ARE ON NARCOTIC MEDICATIONS IT IS NOT PERMISSIBLE TO OPERATE A MOTOR VEHICLE (MOTORCYCLE/CAR/TRUCK/MOPED) OR HEAVY MACHINERY ?DO NOT MIX NARCOTICS WITH OTHER CNS (Christina Mercado) DEPRESSANTS SUCH AS ALCOHOL ? ? ?STOP SMOKING OR USING NICOTINE PRODUCTS!!!! ? As discussed nicotine severely impairs your body's ability to heal surgical and traumatic wounds but also  impairs bone healing.  Wounds and bone heal by forming microscopic blood vessels (angiogenesis) and nicotine is a vasoconstrictor (essentially, shrinks blood vessels).  Therefore, if vasoconstriction occurs to these microscopic blood vessels they essentially disappear and are unable to deliver necessary nutrients to the healing tissue.  This is one modifiable factor that you can do to dramatically increase your chances of healing your injury.   ? (This means no smoking, no nicotine gum, patches, etc) ? ?DO NOT USE NONSTEROIDAL ANTI-INFLAMMATORY DRUGS (NSAID'S) ? Using products such as Advil (ibuprofen), Aleve (naproxen), Motrin (ibuprofen) for additional pain control during fracture healing can delay and/or prevent the healing response.  If you would like to take over the counter (OTC) medication, Tylenol (acetaminophen) is ok.  However, some narcotic medications that are given for pain control contain acetaminophen as well. Therefore, you should not exceed more than 4000 mg of tylenol in a day if you do not have liver disease.  Also note that there are may OTC medicines, such as cold medicines and allergy medicines that my contain tylenol as well.  If you have any questions about medications and/or interactions please ask your doctor/PA or your pharmacist.  ?   ? ?ICE AND ELEVATE INJURED/OPERATIVE EXTREMITY ? Using ice and elevating the injured extremity above your heart can help with swelling and pain control.  Icing in a pulsatile fashion, such as 20 minutes on and 20 minutes off, can be followed.   ? Do not place ice directly on skin. Make sure there is a barrier between to skin and the ice pack.   ? Using frozen items such as frozen peas works  well as the conform nicely to the are that needs to be iced. ? ?USE AN ACE WRAP OR TED HOSE FOR SWELLING CONTROL ? In addition to icing and elevation, Ace wraps or TED hose are used to help limit and resolve swelling.  It is recommended to use Ace wraps or TED hose until  you are informed to stop.   ? When using Ace Wraps start the wrapping distally (farthest away from the body) and wrap proximally (closer to the body) ?  Example: If you had surgery on your leg or thing and you do not have a splint on, start the ace wrap at the toes and work your way up to the thigh ?       If you had surgery on your upper extremity and do not have a splint on, start the ace wrap at your fingers and work your way up to the upper arm ? ? ? ?CALL THE OFFICE WITH ANY QUESTIONS OR CONCERNS: [phone]  ? ?VISIT OUR WEBSITE FOR ADDITIONAL INFORMATION: [email or web address] ?  ? ? ?Discharge Wound Care Instructions ? ?Do NOT apply any ointments, solutions or lotions to pin sites or surgical wounds.  These prevent needed drainage and even though solutions like hydrogen peroxide kill bacteria, they also damage cells lining the pin sites that help fight infection.  Applying lotions or ointments can keep the wounds moist and can cause them to breakdown and open up as well. This can increase the risk for infection. When in doubt call the office. ? ? ?If any drainage is noted, use one layer of adaptic or Mepitel, then gauze, Kerlix, and an ace wrap. ?- These dressing supplies should be available at local medical supply stores [facility], [facility], etc) as well as Management consultant ([employer], [facility], Paediatric nurse, etc) ? ?Once the incision is completely dry and without drainage, it may be left open to air out.  Showering may begin 36-48 hours later.  Cleaning gently with soap and water. ? ?

## 2022-03-31 NOTE — Progress Notes (Signed)
Inpatient Rehab Admissions: ? ?Inpatient Rehab Consult received.  I met with patient and son, Rush Landmark, at the bedside for rehabilitation assessment and to discuss goals and expectations of an inpatient rehab admission.  Both acknowledged understanding of CIR goals and expectations. Both interested in pt pursuing CIR. Will continue to follow. ? ?Signed: ?Gayland Curry, MS, CCC-SLP ?Admissions Coordinator ?[phone] ? ? ?

## 2022-04-01 ENCOUNTER — Encounter (HOSPITAL_COMMUNITY): Payer: Self-pay | Admitting: Physical Medicine and Rehabilitation

## 2022-04-01 ENCOUNTER — Inpatient Hospital Stay (HOSPITAL_COMMUNITY)
Admission: EM | Admit: 2022-04-01 | Discharge: 2022-04-10 | DRG: 559 | Disposition: A | Discharge status: Skilled Nursing Facility | Payer: Medicare Other | Source: Intra-hospital | Attending: Physical Medicine and Rehabilitation | Admitting: Physical Medicine and Rehabilitation

## 2022-04-01 ENCOUNTER — Other Ambulatory Visit: Payer: Self-pay

## 2022-04-01 DIAGNOSIS — I951 Orthostatic hypotension: Secondary | ICD-10-CM | POA: Diagnosis present

## 2022-04-01 DIAGNOSIS — E663 Overweight: Secondary | ICD-10-CM | POA: Diagnosis present

## 2022-04-01 DIAGNOSIS — K5900 Constipation, unspecified: Secondary | ICD-10-CM | POA: Diagnosis present

## 2022-04-01 DIAGNOSIS — E039 Hypothyroidism, unspecified: Secondary | ICD-10-CM | POA: Diagnosis present

## 2022-04-01 DIAGNOSIS — W010XXD Fall on same level from slipping, tripping and stumbling without subsequent striking against object, subsequent encounter: Secondary | ICD-10-CM | POA: Diagnosis present

## 2022-04-01 DIAGNOSIS — Z992 Dependence on renal dialysis: Secondary | ICD-10-CM

## 2022-04-01 DIAGNOSIS — Z6825 Body mass index (BMI) 25.0-25.9, adult: Secondary | ICD-10-CM

## 2022-04-01 DIAGNOSIS — S42412D Displaced simple supracondylar fracture without intercondylar fracture of left humerus, subsequent encounter for fracture with routine healing: Principal | ICD-10-CM

## 2022-04-01 DIAGNOSIS — Z794 Long term (current) use of insulin: Secondary | ICD-10-CM | POA: Diagnosis not present

## 2022-04-01 DIAGNOSIS — E1122 Type 2 diabetes mellitus with diabetic chronic kidney disease: Secondary | ICD-10-CM | POA: Diagnosis present

## 2022-04-01 DIAGNOSIS — Z96641 Presence of right artificial hip joint: Secondary | ICD-10-CM | POA: Diagnosis present

## 2022-04-01 DIAGNOSIS — S42415A Nondisplaced simple supracondylar fracture without intercondylar fracture of left humerus, initial encounter for closed fracture: Secondary | ICD-10-CM | POA: Diagnosis not present

## 2022-04-01 DIAGNOSIS — Z79890 Hormone replacement therapy (postmenopausal): Secondary | ICD-10-CM

## 2022-04-01 DIAGNOSIS — Z79899 Other long term (current) drug therapy: Secondary | ICD-10-CM | POA: Diagnosis not present

## 2022-04-01 DIAGNOSIS — M069 Rheumatoid arthritis, unspecified: Secondary | ICD-10-CM | POA: Diagnosis present

## 2022-04-01 DIAGNOSIS — R54 Age-related physical debility: Secondary | ICD-10-CM | POA: Diagnosis present

## 2022-04-01 DIAGNOSIS — E875 Hyperkalemia: Secondary | ICD-10-CM | POA: Diagnosis not present

## 2022-04-01 DIAGNOSIS — E11649 Type 2 diabetes mellitus with hypoglycemia without coma: Secondary | ICD-10-CM | POA: Diagnosis not present

## 2022-04-01 DIAGNOSIS — Z888 Allergy status to other drugs, medicaments and biological substances status: Secondary | ICD-10-CM

## 2022-04-01 DIAGNOSIS — H9190 Unspecified hearing loss, unspecified ear: Secondary | ICD-10-CM | POA: Diagnosis present

## 2022-04-01 DIAGNOSIS — R34 Anuria and oliguria: Secondary | ICD-10-CM | POA: Diagnosis present

## 2022-04-01 DIAGNOSIS — N186 End stage renal disease: Secondary | ICD-10-CM | POA: Diagnosis present

## 2022-04-01 DIAGNOSIS — G4700 Insomnia, unspecified: Secondary | ICD-10-CM | POA: Diagnosis present

## 2022-04-01 DIAGNOSIS — S42302A Unspecified fracture of shaft of humerus, left arm, initial encounter for closed fracture: Secondary | ICD-10-CM | POA: Diagnosis present

## 2022-04-01 DIAGNOSIS — N250 Renal osteodystrophy: Secondary | ICD-10-CM | POA: Diagnosis present

## 2022-04-01 DIAGNOSIS — G8918 Other acute postprocedural pain: Secondary | ICD-10-CM | POA: Diagnosis not present

## 2022-04-01 DIAGNOSIS — R2689 Other abnormalities of gait and mobility: Secondary | ICD-10-CM | POA: Diagnosis present

## 2022-04-01 DIAGNOSIS — Z9181 History of falling: Secondary | ICD-10-CM

## 2022-04-01 DIAGNOSIS — S42402A Unspecified fracture of lower end of left humerus, initial encounter for closed fracture: Secondary | ICD-10-CM | POA: Diagnosis not present

## 2022-04-01 DIAGNOSIS — R000 Tachycardia, unspecified: Secondary | ICD-10-CM | POA: Diagnosis not present

## 2022-04-01 DIAGNOSIS — R112 Nausea with vomiting, unspecified: Secondary | ICD-10-CM | POA: Diagnosis not present

## 2022-04-01 DIAGNOSIS — Z7409 Other reduced mobility: Secondary | ICD-10-CM | POA: Diagnosis present

## 2022-04-01 DIAGNOSIS — N390 Urinary tract infection, site not specified: Secondary | ICD-10-CM | POA: Diagnosis present

## 2022-04-01 DIAGNOSIS — I120 Hypertensive chronic kidney disease with stage 5 chronic kidney disease or end stage renal disease: Secondary | ICD-10-CM | POA: Diagnosis present

## 2022-04-01 DIAGNOSIS — D649 Anemia, unspecified: Secondary | ICD-10-CM | POA: Diagnosis present

## 2022-04-01 DIAGNOSIS — M542 Cervicalgia: Secondary | ICD-10-CM | POA: Diagnosis not present

## 2022-04-01 LAB — RENAL FUNCTION PANEL
Albumin: 2.4 g/dL — ABNORMAL LOW (ref 3.5–5.0)
Albumin: 3 g/dL — ABNORMAL LOW (ref 3.5–5.0)
Anion gap: 13 (ref 5–15)
Anion gap: 16 — ABNORMAL HIGH (ref 5–15)
BUN: 68 mg/dL — ABNORMAL HIGH (ref 8–23)
BUN: 30 mg/dL — ABNORMAL HIGH (ref 8–23)
CO2: 22 mmol/L (ref 22–32)
CO2: 24 mmol/L (ref 22–32)
Calcium: 8.6 mg/dL — ABNORMAL LOW (ref 8.9–10.3)
Calcium: 11.4 mg/dL — ABNORMAL HIGH (ref 8.9–10.3)
Chloride: 100 mmol/L (ref 98–111)
Chloride: 97 mmol/L — ABNORMAL LOW (ref 98–111)
Creatinine, Ser: 6.98 mg/dL — ABNORMAL HIGH (ref 0.44–1.00)
Creatinine, Ser: 7.47 mg/dL — ABNORMAL HIGH (ref 0.44–1.00)
GFR, Estimated: 5 mL/min — ABNORMAL LOW (ref >60)
GFR, Estimated: 5 mL/min — ABNORMAL LOW (ref >60)
Glucose, Bld: 94 mg/dL (ref 70–99)
Glucose, Bld: 212 mg/dL — ABNORMAL HIGH (ref 70–99)
Phosphorus: 7.2 mg/dL — ABNORMAL HIGH (ref 2.5–4.6)
Phosphorus: 3.8 mg/dL (ref 2.5–4.6)
Potassium: 4.3 mmol/L (ref 3.5–5.1)
Potassium: 3.7 mmol/L (ref 3.5–5.1)
Sodium: 135 mmol/L (ref 135–145)
Sodium: 137 mmol/L (ref 135–145)

## 2022-04-01 LAB — CBC
HCT: 27.5 % — ABNORMAL LOW (ref 36.0–46.0)
Hemoglobin: 8.5 g/dL — ABNORMAL LOW (ref 12.0–15.0)
MCH: 29.8 pg (ref 26.0–34.0)
MCHC: 30.9 g/dL (ref 30.0–36.0)
MCV: 96.5 fL (ref 80.0–100.0)
Platelets: 190 10*3/uL (ref 150–400)
RBC: 2.85 MIL/uL — ABNORMAL LOW (ref 3.87–5.11)
RDW: 14.6 % (ref 11.5–15.5)
WBC: 9.5 10*3/uL (ref 4.0–10.5)
nRBC: 0 % (ref 0.0–0.2)

## 2022-04-01 LAB — IRON AND TIBC
Iron: 23 ug/dL — ABNORMAL LOW (ref 28–170)
Saturation Ratios: 12 % (ref 10.4–31.8)
TIBC: 188 ug/dL — ABNORMAL LOW (ref 250–450)
UIBC: 165 ug/dL

## 2022-04-01 LAB — GLUCOSE, CAPILLARY
Glucose-Capillary: 90 mg/dL (ref 70–99)
Glucose-Capillary: 127 mg/dL — ABNORMAL HIGH (ref 70–99)
Glucose-Capillary: 135 mg/dL — ABNORMAL HIGH (ref 70–99)
Glucose-Capillary: 145 mg/dL — ABNORMAL HIGH (ref 70–99)
Glucose-Capillary: 130 mg/dL — ABNORMAL HIGH (ref 70–99)

## 2022-04-01 LAB — MAGNESIUM: Magnesium: 1.7 mg/dL (ref 1.7–2.4)

## 2022-04-01 MED ORDER — LIDOCAINE-PRILOCAINE 2.5-2.5 % EX CREA: 1.0000 "application " | TOPICAL_CREAM | CUTANEOUS | Status: DC | PRN

## 2022-04-01 MED ORDER — POLYETHYLENE GLYCOL 3350 17 G PO PACK: 17.0000 g | PACK | Freq: Every day | ORAL | Status: DC | PRN

## 2022-04-01 MED ORDER — SODIUM CHLORIDE 0.9 % IV SOLN: 100.0000 mL | INTRAVENOUS | Status: DC | PRN

## 2022-04-01 MED ORDER — CALCITRIOL 0.25 MCG PO CAPS: 0.2500 ug | ORAL_CAPSULE | ORAL | Status: DC

## 2022-04-01 MED ORDER — METOCLOPRAMIDE HCL 5 MG/ML IJ SOLN: 5.0000 mg | Freq: Three times a day (TID) | INTRAMUSCULAR | Status: DC | PRN

## 2022-04-01 MED ORDER — HYDROXYZINE HCL 10 MG PO TABS
10.0000 mg | ORAL_TABLET | Freq: Three times a day (TID) | ORAL | Status: DC | PRN
  Administered 2022-04-02 – 2022-04-09 (×8): 10 mg via ORAL
  Filled 2022-04-01 (×8): qty 1

## 2022-04-01 MED ORDER — METOCLOPRAMIDE HCL 5 MG PO TABS
5.0000 mg | ORAL_TABLET | Freq: Three times a day (TID) | ORAL | Status: DC | PRN
Start: 2022-04-01 — End: 2022-04-10

## 2022-04-01 MED ORDER — MELATONIN 5 MG PO TABS
5.0000 mg | ORAL_TABLET | Freq: Every evening | ORAL | 0 refills | Status: AC | PRN
Start: 2022-04-01 — End: ?

## 2022-04-01 MED ORDER — HEPARIN SODIUM (PORCINE) 1000 UNIT/ML DIALYSIS
1000.0000 [IU] | INTRAMUSCULAR | Status: DC | PRN
  Filled 2022-04-01: qty 1

## 2022-04-01 MED ORDER — MELATONIN 5 MG PO TABS
5.0000 mg | ORAL_TABLET | Freq: Every evening | ORAL | Status: DC | PRN
Start: 2022-04-01 — End: 2022-04-10
  Administered 2022-04-02 – 2022-04-09 (×7): 5 mg via ORAL
  Filled 2022-04-01 (×9): qty 1

## 2022-04-01 MED ORDER — ALTEPLASE 2 MG IJ SOLR: 2.0000 mg | Freq: Once | INTRAMUSCULAR | Status: DC | PRN

## 2022-04-01 MED ORDER — PENTAFLUOROPROP-TETRAFLUOROETH EX AERO: 1.0000 "application " | INHALATION_SPRAY | CUTANEOUS | Status: DC | PRN

## 2022-04-01 MED ORDER — INSULIN ASPART 100 UNIT/ML IJ SOLN: 0.0000 [IU] | INTRAMUSCULAR | Status: DC

## 2022-04-01 MED ORDER — ACETAMINOPHEN 325 MG PO TABS
650.0000 mg | ORAL_TABLET | Freq: Four times a day (QID) | ORAL | Status: DC
  Administered 2022-04-01 – 2022-04-10 (×31): 650 mg via ORAL
  Filled 2022-04-01 (×31): qty 2

## 2022-04-01 MED ORDER — ONDANSETRON HCL 4 MG PO TABS
4.0000 mg | ORAL_TABLET | Freq: Four times a day (QID) | ORAL | Status: DC | PRN
  Administered 2022-04-01 – 2022-04-07 (×4): 4 mg via ORAL
  Filled 2022-04-01 (×4): qty 1

## 2022-04-01 MED ORDER — HYDRALAZINE HCL 20 MG/ML IJ SOLN
10.0000 mg | INTRAMUSCULAR | Status: DC | PRN
  Filled 2022-04-01: qty 0.5

## 2022-04-01 MED ORDER — HEPARIN SODIUM (PORCINE) 5000 UNIT/ML IJ SOLN
5000.0000 [IU] | Freq: Three times a day (TID) | INTRAMUSCULAR | Status: DC
  Administered 2022-04-01 – 2022-04-10 (×25): 5000 [IU] via SUBCUTANEOUS
  Filled 2022-04-01 (×26): qty 1

## 2022-04-01 MED ORDER — HEPARIN SODIUM (PORCINE) 1000 UNIT/ML DIALYSIS
1000.0000 [IU] | INTRAMUSCULAR | Status: DC | PRN
  Administered 2022-04-08: 1000 [IU] via INTRAVENOUS_CENTRAL
  Filled 2022-04-01 (×2): qty 1

## 2022-04-01 MED ORDER — LIDOCAINE HCL (PF) 1 % IJ SOLN
5.0000 mL | INTRAMUSCULAR | Status: DC | PRN
  Filled 2022-04-01: qty 5

## 2022-04-01 MED ORDER — SEVELAMER CARBONATE 800 MG PO TABS: 800.0000 mg | ORAL_TABLET | Freq: Three times a day (TID) | ORAL | Status: AC

## 2022-04-01 MED ORDER — OXYCODONE HCL 5 MG PO TABS
10.0000 mg | ORAL_TABLET | Freq: Four times a day (QID) | ORAL | Status: DC | PRN
  Administered 2022-04-01 – 2022-04-04 (×7): 10 mg via ORAL
  Filled 2022-04-01 (×7): qty 2

## 2022-04-01 MED ORDER — PENTAFLUOROPROP-TETRAFLUOROETH EX AERO
1.0000 | INHALATION_SPRAY | CUTANEOUS | Status: DC | PRN
Start: 2022-04-01 — End: 2022-04-10

## 2022-04-01 MED ORDER — DOCUSATE SODIUM 100 MG PO CAPS
100.0000 mg | ORAL_CAPSULE | Freq: Two times a day (BID) | ORAL | Status: DC
  Administered 2022-04-01 – 2022-04-04 (×6): 100 mg via ORAL
  Filled 2022-04-01 (×6): qty 1

## 2022-04-01 MED ORDER — VITAMIN D 25 MCG (1000 UNIT) PO TABS
2000.0000 [IU] | ORAL_TABLET | Freq: Every day | ORAL | Status: DC
  Administered 2022-04-02 – 2022-04-10 (×9): 2000 [IU] via ORAL
  Filled 2022-04-01 (×9): qty 2

## 2022-04-01 MED ORDER — ONDANSETRON HCL 4 MG/2ML IJ SOLN
4.0000 mg | Freq: Four times a day (QID) | INTRAMUSCULAR | Status: DC | PRN
  Administered 2022-04-02 – 2022-04-08 (×2): 4 mg via INTRAVENOUS
  Filled 2022-04-01 (×2): qty 2

## 2022-04-01 MED ORDER — SEVELAMER CARBONATE 800 MG PO TABS
800.0000 mg | ORAL_TABLET | Freq: Three times a day (TID) | ORAL | Status: DC
  Administered 2022-04-01 – 2022-04-10 (×25): 800 mg via ORAL
  Filled 2022-04-01 (×25): qty 1

## 2022-04-01 MED ORDER — CHLORHEXIDINE GLUCONATE CLOTH 2 % EX PADS
6.0000 | MEDICATED_PAD | Freq: Every day | CUTANEOUS | Status: DC
Start: 2022-04-01 — End: 2022-04-04
  Administered 2022-04-01 – 2022-04-04 (×4): 6 via TOPICAL

## 2022-04-01 MED ORDER — OXYCODONE HCL 5 MG PO TABS
5.0000 mg | ORAL_TABLET | Freq: Every evening | ORAL | Status: DC | PRN
  Administered 2022-04-01 – 2022-04-02 (×2): 5 mg via ORAL
  Filled 2022-04-01 (×2): qty 1

## 2022-04-01 MED ORDER — CEFTRIAXONE SODIUM 1 G IJ SOLR
1.0000 g | INTRAMUSCULAR | Status: DC
  Administered 2022-04-02 – 2022-04-04 (×3): 1 g via INTRAVENOUS
  Filled 2022-04-01 (×3): qty 10

## 2022-04-01 MED ORDER — LIDOCAINE HCL (PF) 1 % IJ SOLN: 5.0000 mL | INTRAMUSCULAR | Status: DC | PRN

## 2022-04-01 NOTE — Plan of Care (Signed)
  Problem: Clinical Measurements: Goal: Respiratory complications will improve Outcome: Progressing   Problem: Coping: Goal: Level of anxiety will decrease Outcome: Progressing   

## 2022-04-01 NOTE — Progress Notes (Signed)
Inpatient Rehab Admissions Coordinator:  ?There is a bed available in CIR for pt to admit today. Dr. Rodena Piety aware and in agreement. Pt, son Rush Landmark, NSG, and TOC made aware. NSG can call 4W nurses desk [phone]) and give report after 12pm.  ? ? ?Gayland Curry, MS, CCC-SLP ?Admissions Coordinator ?[phone] ? ?

## 2022-04-01 NOTE — Progress Notes (Signed)
?[facility] ?Progress Note  ? ?86 y.o. 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 She usually dialyzes Tues and Thur with [facility] [place] [facility]. Unfortunately she tripped and fell sustaining a supracondylar distal humerus fracture s/p ORIF on 4/19. She actually had a RIJ TC placed by [facility] as the left upper arm access would not be accessible with the fracture + ORIF.  ? ?Editor, commissioning on [place] ?Tues Sat 3hr 15 min EDW 64kg ?Venofer 50qw  Mircera 50 q4 (last 4/11) ?Calcitriol 0.25 BIW ?H1300 bolus 600/h ? ?Assessment/ Plan:   ?ESRD - usually dialyzed Tues and Sat at [facility] on [address] last HD on Tuesday.  ?Seen on HD ?3K bath 103/61 ?1.5L net UF RIJ TC ?Still good bruit in left arm ? ?Left elbow fracture - appreciate vascular placing a RIJ TC and will need to wait till left arm is accessible before attempting dialysis. She's in a lot of pain and doubt we will be able to access for weeks. S/p ORIF left supracondylar humerus fracture on 4/19.  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 On Mircera as outpatient. 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  Followed at [facility] by Dr. Madelin Rear ? ?Subjective:   ?Still c/o left elbow pain but a little better than yest. ?Denies f/c/n/ sob  ? ?Objective:   ?BP 103/61   Pulse (!) 107   Temp 98.3 ?F (36.8 ?C)   Resp 19   Ht '5\' 5"'$  (1.651 m)   Wt 65.4 kg   SpO2 98%   BMI 23.99 kg/m?  ? ?Intake/Output Summary (Last 24 hours) at 04/01/2022 0959 ?Last data filed at 04/01/2022 0545 ?Gross per 24 hour  ?Intake 695 ml  ?Output 950 ml  ?Net -255 ml  ? ?Weight change:  ? ?Physical Exam: ?GEN: NAD, A&Ox3, NCAT ?HEENT: No conjunctival pallor, EOMI ?NECK: Supple, no thyromegaly ?LUNGS: CTA B/L no rales, rhonchi or wheezing ?CV: RRR, No M/R/G ?ABD: SNDNT +BS  ?EXT: No lower  extremity edema ?ACCESS: lt arm + bruit even thru bandages, RIJ TC ? ? ? ?Imaging: ?No results found. ? ?Labs: ?BMET ?Recent Labs  ?Lab 03/29/22 ?4010 03/29/22 ?1614 03/30/22 ?2725 03/30/22 ?1637 03/31/22 ?3664 03/31/22 ?1637 04/01/22 ?0231  ?NA 137 138 140 138 135 137 135  ?K 4.9 4.0 3.8 4.4 4.0 3.8 4.3  ?CL 99 100 103 100 102 98 100  ?CO2 '29 26 25 25 22 24 22  '$ ?GLUCOSE 118* 137* 177* 124* 101* 163* 94  ?BUN 22 25* 37* 51* 56* 65* 68*  ?CREATININE 4.10* 4.49* 5.33* 5.92* 6.25* 6.86* 6.98*  ?CALCIUM 9.3 9.1 8.6* 8.6* 8.6* 8.5* 8.6*  ?PHOS  --  5.8* 6.0* 6.3* 7.5* 7.0* 7.2*  ? ?CBC ?Recent Labs  ?Lab 03/28/22 ?0631 03/29/22 ?4034 03/31/22 ?0420 04/01/22 ?0231  ?WBC 12.3* 9.3 8.6 9.5  ?NEUTROABS  --  5.7  --   --   ?HGB 10.8* 10.8* 9.3* 8.5*  ?HCT 34.4* 33.2* 29.4* 27.5*  ?MCV 95.3 96.5 97.4 96.5  ?PLT 242 195 165 190  ? ? ?Medications:   ? ? acetaminophen  650 mg Oral Q6H  ? Chlorhexidine Gluconate Cloth  6 each Topical Daily  ? cholecalciferol  2,000 Units Oral BH-q7a  ? docusate sodium  100 mg Oral BID  ? heparin injection (subcutaneous)  5,000 Units Subcutaneous Q8H  ? insulin aspart  0-6 Units Subcutaneous Q4H  ? sevelamer carbonate  800 mg Oral TID WC  ? ? ? ? ?Otelia Santee, MD ?04/01/2022, 9:59 AM  ? ?

## 2022-04-01 NOTE — H&P (Signed)
? ? ?Physical Medicine and Rehabilitation Admission H&P ? ?  ?No chief complaint on file. ?:L distal humeral fracture ? ?HPI: Pt is an 86 yr old female with light chain deposition disease, HTN, DM- insulin dependent; and ESRD on HD T/S only ?Who had ground level fall on 4/18 and brought to ER at [facility]- she was transferred to [facility] by Dr Orma Flaming found she had a distal L humeral/supracondylar/elbow fracture on early 03/29/22. She underwent ORIF by Dr Doreatha Martin 03/29/22 after transfer to [facility]. Marland Kitchen  ?She also had her fistula in LUE near fracture and since would not be able to use for HD, she also had HD catheter placed for HD.  ?Her DM was controlled with SSI-  ?She was made NWB on LUE by Dr Doreatha Martin- after surgery.  ? ?She was seen by PT and OT for impaired function after fall and difficulty with ADLs and mobility and transferred to CIR on 04/01/22 to work on impaired function so she could then hopefully get home safely.  ? ?Spoke to son who, although SNF was recommended and there was concern about her fatigue level after Acute therapies, he really felt she would do better in CIR level of care and wanted her transferred. ? ?Pt reports she's very HOH- pain comes and goes and worse when cold- pain meds working.  ?Vomited last night, but admits she ate a lot of food since "it was so good".  ?LBM 3 days ago before admission.  ?Still voids- oliguric; and per chart had STM deficits and decreased awareness.  ? ? ? ?Review of Systems  ?Constitutional:  Positive for chills and malaise/fatigue.  ?HENT:  Positive for hearing loss. Negative for congestion and ear discharge.   ?Eyes:  Negative for double vision and pain.  ?Respiratory:  Negative for sputum production and shortness of breath.   ?Cardiovascular:  Negative for chest pain and leg swelling.  ?     L hand swelling  ?Gastrointestinal:  Positive for constipation, nausea and vomiting. Negative for abdominal pain and blood in stool.  ?Genitourinary:  Positive for  urgency. Negative for dysuria and frequency.  ?Musculoskeletal:  Positive for joint pain.  ?Skin:  Negative for itching and rash.  ?Neurological:  Positive for focal weakness. Negative for tremors and speech change.  ?Endo/Heme/Allergies: Negative.   ?Psychiatric/Behavioral:  Negative for hallucinations and substance abuse. The patient has insomnia.   ?All other systems reviewed and are negative. ?Past Medical History:  ?Diagnosis Date  ? Arthritis   ? Diabetes mellitus without complication ([place])   ? Hypertension   ? Kidney failure   ? stage 3  ? Proteinuria   ? ?Past Surgical History:  ?Procedure Laterality Date  ? DIALYSIS/PERMA CATHETER INSERTION N/A 05/30/2021  ? Procedure: DIALYSIS/PERMA CATHETER INSERTION;  Surgeon: Algernon Huxley, MD;  Location: [place] [facility];  Service: Cardiovascular;  Laterality: N/A;  ? DIALYSIS/PERMA CATHETER INSERTION N/A 03/28/2022  ? Procedure: DIALYSIS/PERMA CATHETER INSERTION;  Surgeon: Evaristo Bury, MD;  Location: [place] [facility];  Service: Cardiovascular;  Laterality: N/A;  ? DIALYSIS/PERMA CATHETER REMOVAL N/A 08/02/2021  ? Procedure: DIALYSIS/PERMA CATHETER REMOVAL;  Surgeon: Katha Cabal, MD;  Location: [place] [facility];  Service: Cardiovascular;  Laterality: N/A;  ? EYE SURGERY    ? ORIF ELBOW FRACTURE Left 03/29/2022  ? Procedure: OPEN REDUCTION INTERNAL FIXATION (ORIF) LEFT ELBOW/OLECRANON FRACTURE;  Surgeon: Shona Needles, MD;  Location: [place];  Service: Orthopedics;  Laterality: Left;  ? RENAL BIOPSY    ?  TOTAL HIP ARTHROPLASTY Right   ? ?History reviewed. No pertinent family history. ?Social History:  reports that she has never smoked. She has never used smokeless tobacco. She reports that she does not drink alcohol and does not use drugs. ?Allergies:  ?Allergies  ?Allergen Reactions  ? Byetta 10 Mcg Pen [Exenatide] Nausea And Vomiting and Other (See Comments)  ?  Weight loss ?  ? Ibuprofen Swelling  ?  Arm swelling - not sure if it was the ibuprofen  but she uses tylenol instead. Is able to take Mobic so this is not a class allergy.  ? Lisinopril Rash  ? Aspirin Other (See Comments)  ?  Unknown ?  ? ?Medications Prior to Admission  ?Medication Sig Dispense Refill  ? acetaminophen (TYLENOL) 500 MG tablet Take 1,000 mg by mouth every 8 (eight) hours as needed for moderate pain.    ? acyclovir (ZOVIRAX) 200 MG capsule Take 200 mg by mouth daily.    ? amLODipine (NORVASC) 2.5 MG tablet Take 2.5 mg by mouth daily.    ? Ascorbic Acid (VITAMIN C) 1000 MG tablet Take 1,000 mg by mouth daily.    ? atorvastatin (LIPITOR) 40 MG tablet Take 40 mg by mouth daily at 6 PM.    ? calcium gluconate 500 MG tablet Take 1 tablet by mouth daily. (Patient not taking: Reported on 03/28/2022)    ? Cholecalciferol (VITAMIN D3) 1000 units CAPS Take 2,000 Units by mouth every morning.    ? dexamethasone (DECADRON) 4 MG tablet Take 8 mg by mouth See admin instructions. Once a week, before Ninlaro treatment.    ? docusate sodium (COLACE) 100 MG capsule Take 1 capsule (100 mg total) by mouth 2 (two) times daily. (Patient taking differently: Take 100 mg by mouth 2 (two) times daily as needed for moderate constipation.) 10 capsule 0  ? DULoxetine (CYMBALTA) 60 MG capsule Take 60 mg by mouth daily.    ? ferrous sulfate 325 (65 FE) MG tablet Take 325 mg by mouth daily with breakfast.    ? hydrOXYzine (ATARAX/VISTARIL) 10 MG tablet Take 10 mg by mouth 3 (three) times daily as needed for itching.    ? levothyroxine (SYNTHROID) 150 MCG tablet Take 150 mcg by mouth daily before breakfast.    ? lidocaine-prilocaine (EMLA) cream Apply 1 application topically as needed (dialysis access).    ? NINLARO 3 MG capsule Take 3 mg by mouth once a week. Once a week for 3 weeks in a 28 day cycle    ? ondansetron (ZOFRAN) 8 MG tablet Take 8 mg by mouth every 8 (eight) hours as needed for nausea or vomiting.    ? Oxycodone HCl 10 MG TABS Take 5-10 mg by mouth 5 (five) times daily.  0  ? polyethylene glycol (MIRALAX  / GLYCOLAX) packet Take 17 g by mouth daily as needed for moderate constipation.    ? vitamin B-12 (CYANOCOBALAMIN) 1000 MCG tablet Take 1,000 mcg by mouth every morning.    ? ? ? ? ?Home: ?Home Living ?Family/patient expects to be discharged to:: Private residence ?Living Arrangements: Alone ?Available Help at Discharge: Family ?Type of Home: Independent living facility ?Home Access: Level entry ?Home Layout: One level ?Bathroom Shower/Tub: Walk-in shower ?Bathroom Toilet: Handicapped height ?Bathroom Accessibility: Yes ?Home Equipment: Conservation officer, nature (2 wheels), Shower seat - built in ?  ?Functional History: ?Prior Function ?Prior Level of Function : Independent/Modified Independent ?Mobility Comments: using RW due to hx of hip fx and leg length discrepancy ?  ADLs Comments: Indep ? ?Functional Status:  ?Mobility: ?Bed Mobility ?Overal bed mobility: Needs Assistance ?Bed Mobility: Supine to Sit ?Supine to sit: Mod assist ?Sit to supine: Mod assist ?General bed mobility comments: modA for trunk elevation and repositioning hips towards EOB, pt has a harder time scooting her L hip forward due to not being able to use her LUE ?Transfers ?Overall transfer level: Needs assistance ?Equipment used: 2 person hand held assist ?Transfers: Sit to/from Stand, Bed to chair/wheelchair/BSC ?Sit to Stand: Min assist ?Bed to/from chair/wheelchair/BSC transfer type:: Step pivot ?Step pivot transfers: Min assist, +2 safety/equipment, +2 physical assistance ? Lateral/Scoot Transfers: Min guard ?General transfer comment: Min A +2 to take steps forwards and to the side, heavily reliant on HHA on RUE, quick to fatigue and require a seated rest break. ?  ?  ? ?ADL: ?ADL ?Overall ADL's : Needs assistance/impaired ?Eating/Feeding: Set up, Sitting ?Grooming: Set up, Sitting ?Upper Body Bathing: Minimal assistance, Sitting ?Lower Body Bathing: Moderate assistance, Sitting/lateral leans, Sit to/from stand ?Upper Body Dressing : Minimal  assistance, Sitting ?Lower Body Dressing: Moderate assistance, Sitting/lateral leans, Sit to/from stand ?Toilet Transfer: Moderate assistance, +2 for safety/equipment, +2 for physical assistance, Stand-pivot ?T

## 2022-04-01 NOTE — Progress Notes (Signed)
Patient ID: Azelyn Batie, female   DOB: September 04, 1936, 86 y.o.   MRN: [id number] ?INPATIENT REHABILITATION ADMISSION NOTE ? ? ?Arrival Method: bed ? ?   ?Mental Orientation: aletrt and oriented x3 with confusion reported after dialysis  ? ? ?Assessment: completed per flowsheet  ? ? ?Skin: intact with MASD to groin  ? ? ?IV'S: 1x 22G Right forearm placed on admission  ?1x  HD catheter to right upper chest  ? ?Pain: 7/10 pain to left elbow oxycodone administered per order  ? ? ?Tubes and Drains: none ? ? ?Safety Measures: fall mats and bed alarm 4 side rails up per family request  ? ? ?Vital Signs: completed ? ? ?Height and Weight: 5'5" 67.1kg  ? ? ?Rehab Orientation: completed  ? ? ?Family: Son, Tyriana Helmkamp at bedside ? ? ? ?Notes:  ?

## 2022-04-01 NOTE — TOC Transition Note (Signed)
Transition of Care (TOC) - CM/SW Discharge Note ? ? ?Patient Details  ?Name: Davinia Riccardi ?MRN: [id number] ?Date of Birth: 06-24-1936 ? ?Transition of Care (TOC) CM/SW Contact:  ?Amador Cunas, LCSW ?Phone Number: ?04/01/2022, 11:56 AM ? ? ?Clinical Narrative: pt accepted to CIR and plan is for dc today. TOC signing off at dc.  ? ?Wandra Feinstein, MSW, LCSW ?[phone] (coverage) ? ?   ? ? ? ?Final next level of care: [place] ?Barriers to Discharge: No Barriers Identified ? ? ?Patient Goals and CMS Choice ?  ?CMS Medicare.gov Compare Post Acute Care list provided to:: Patient Represenative (must comment) ?  ? ?Discharge Placement ?  ?           ?Patient chooses bed at:  (CIR) ?  ?Name of family member notified: Bill/son ?Patient and family notified of of transfer: 04/01/22 ? ?Discharge Plan and Services ?  ?  ?           ?  ?  ?  ?  ?  ?  ?  ?  ?  ?  ? ?Social Determinants of Health (SDOH) Interventions ?  ? ? ?Readmission Risk Interventions ?   ? View : No data to display.  ?  ?  ?  ? ? ? ? ? ?

## 2022-04-01 NOTE — Progress Notes (Signed)
Signed    ? ?   ?   ?   ?   ?   ?   ?   ?   ?   ?   ?   ?   ?   ?   ?   ?   ?   ?   ?   ?   ?   ?   ?   ?   ?   ?   ?   ?   ?   ?   ?   ?   ?   ?   ?   ?   ?   ?   ?   ?   ?   ?   ?   ?   ?   ?   ?   ?   ?   ?   ?   ?   ?   ?   ?   ?   ?   ?   ?   ?   ?   ?   ?   ?   ?   ?   ?   ?   ?   ?   ?   ?   ?   ?   ?   ?   ?   ?   ?   ?   ?   ?   ?   ?   ?   ?   ?   ?   ?   ?   ?   ?   ?   ?   ?   ?   ?   ?   ?   ?   ?   ?   ?   ?   ?   ?   ?   ?   ?   ?   ?   ?   ?   ?   ?   ?   ?   ?   ?   ?   ?   ?   ?   ?   ?   ?   ?   ?   ?   ?   ?   ?   ?   ?   ?   ?   ?   ?   ?   ?   ?   ?   ?   ?   ?   ?   ?   ?   ?   ?   ?   ?   ?   ?   ?   ?   ?PMR Admission Coordinator Pre-Admission Assessment ?  ?Patient: Christina Mercado is an 86 y.o., female ?MRN: [id number] ?DOB: 1936-09-05 ?Height: '5\' 5"'$  (165.1 cm) ?Weight: 65.4 kg ?  ?Insurance Information ?HMO:     PPO:      PCP:      IPA:      80/20: yes     OTHER:  ?PRIMARY: Medicare A & B      Policy#: [id number]      Subscriber: patient ?CM Name:       Phone#:      Fax#:  ?Pre-Cert#:       Employer:  ?Benefits:  Phone #: verified eligibility via [employer] on 03/31/22     Name:  ?Eff. Date: Part A &  B effective 03/12/03     Deduct: $1,600      Out of Pocket Max: NA      Life Max: NA ?CIR: 100% coverage      SNF: 100% coverage days 1-20, 80% coverage days 21-100 ?Outpatient: 80% coverage     Co-Pay: 20% ?Home Health: 100% coverage      Co-Pay:  ?DME: 80% coverage     Co-Pay: 20% ?Providers: pt's choice ?SECONDARY: A[employer]      Policy#: [id number]     Phone#: [phone] ?  ?Financial Counselor:       Phone#:  ?  ?The ?Data Collection Information Summary? for patients in Inpatient Rehabilitation Facilities with attached ?Privacy Act [place] Records? was provided and verbally reviewed with: Patient and Family ?  ?Emergency Contact Information ?Contact Information   ?  ?  Name Relation Home Work Mobile  ?  Hope,William Son [phone]   [phone]  ?  Sutphen,Robin Daughter  [phone]      ?  ?   ?  ?  ?Current Medical History  ?Patient Admitting Diagnosis: left elbow fx, s/p ORIF left humerus fx ?History of Present Illness: Pt is an 86 year old female with medical hx significant for: rheumatoid arthritis, HTN, DM, ESRD from light chain disease on HD on Tuesday and Saturday. Pt [facility] to [facility] on [facility] after a fall. Pt c/o left elbow pain with swelling and pt did hit head. CT head and cervical spine showed no traumatic injury. Left elbow x-ray showed displaced supracondylar fx. Left longarm splint applied. Urinalysis was positive for UTI. ?Pt's HD access is just proximal to her fx at left elbow. Vascular surgery placed permacath in right internal jugular vein on 03/28/22. Pt transferred to [facility] on 03/29/22. Pt underwent ORIF left humerus fx. Therapy evaluations completed and follow up skilled therapy recommended d/t pt's deficits in functional mobility and ability to complete ADLs independently.  ?  ?Patient's medical record from [facility] has been reviewed by the rehabilitation admission coordinator and physician. ?  ?Past Medical History  ?    ?Past Medical History:  ?Diagnosis Date  ? Arthritis    ? Diabetes mellitus without complication ([place])    ? Hypertension    ? Kidney failure    ?  stage 3  ? Proteinuria    ?  ?  ?Has the patient had major surgery during 100 days prior to admission? Yes ?  ?Family History   ?family history is not on file. ?  ?Current Medications ?  ?Current Facility-Administered Medications:  ?  0.9 %  sodium chloride infusion, 100 mL, Intravenous, PRN, Dwana Melena, MD ?  0.9 %  sodium chloride infusion, 100 mL, Intravenous, PRN, Dwana Melena, MD ?  acetaminophen (TYLENOL) tablet 650 mg, 650 mg, Oral, Q6H, Corinne Ports, PA-C, 650 mg at 04/01/22 0636 ?  alteplase (CATHFLO ACTIVASE) injection 2 mg, 2 mg, Intracatheter, Once PRN, Dwana Melena, MD ?  cefTRIAXone (ROCEPHIN) 1 g in sodium chloride 0.9 % 100 mL IVPB, 1 g, Intravenous,  Q24H, McClung, Sarah A, PA-C, Last Rate: 200 mL/hr at 03/31/22 0648, 1 g at 03/31/22 0648 ?  Chlorhexidine Gluconate Cloth 2 % PADS 6 each, 6 each, Topical, Daily, Georgette Shell, MD, 6 each at 03/31/22 1716 ?  cholecalciferol (VITAMIN D) tablet 2,000 Units, 2,000 Units, Oral, Glean Salen, PA-C, 2,000 Units at 04/01/22 5009 ?  docusate sodium (COLACE) capsule 100 mg, 100 mg, Oral,  BID, Corinne Ports, PA-C, 100 mg at 03/31/22 2152 ?  heparin injection 1,000 Units, 1,000 Units, Dialysis, PRN, Dwana Melena, MD ?  heparin injection 5,000 Units, 5,000 Units, Subcutaneous, Q8H, Georgette Shell, MD, 5,000 Units at 04/01/22 2979 ?  hydrALAZINE (APRESOLINE) injection 10 mg, 10 mg, Intravenous, Q4H PRN, Corinne Ports, PA-C ?  hydrOXYzine (ATARAX) tablet 10 mg, 10 mg, Oral, TID PRN, Hosie Poisson, MD, 10 mg at 04/01/22 0656 ?  insulin aspart (novoLOG) injection 0-6 Units, 0-6 Units, Subcutaneous, Q4H, Corinne Ports, PA-C, 1 Units at 03/31/22 1722 ?  lidocaine (PF) (XYLOCAINE) 1 % injection 5 mL, 5 mL, Intradermal, PRN, Dwana Melena, MD ?  lidocaine-prilocaine (EMLA) cream 1 application., 1 application., Topical, PRN, Dwana Melena, MD ?  melatonin tablet 5 mg, 5 mg, Oral, QHS PRN, Howerter, Justin B, DO, 5 mg at 03/31/22 2151 ?  metoCLOPramide (REGLAN) tablet 5-10 mg, 5-10 mg, Oral, Q8H PRN **OR** metoCLOPramide (REGLAN) injection 5-10 mg, 5-10 mg, Intravenous, Q8H PRN, Corinne Ports, PA-C, 10 mg at 03/31/22 2043 ?  ondansetron (ZOFRAN) tablet 4 mg, 4 mg, Oral, Q6H PRN **OR** ondansetron (ZOFRAN) injection 4 mg, 4 mg, Intravenous, Q6H PRN, Corinne Ports, PA-C, 4 mg at 03/31/22 1830 ?  oxyCODONE (Oxy IR/ROXICODONE) immediate release tablet 10 mg, 10 mg, Oral, QID PRN, Corinne Ports, PA-C, 10 mg at 03/31/22 2151 ?  oxyCODONE (Oxy IR/ROXICODONE) immediate release tablet 5 mg, 5 mg, Oral, QHS PRN, Corinne Ports, PA-C, 5 mg at 03/31/22 0032 ?  pentafluoroprop-tetrafluoroeth (GEBAUERS)  aerosol 1 application., 1 application., Topical, PRN, Dwana Melena, MD ?  polyethylene glycol (MIRALAX / GLYCOLAX) packet 17 g, 17 g, Oral, Daily PRN, Corinne Ports, PA-C ?  sevelamer carbonate (RENVELA) tablet 800 mg, 800 mg, Oral, TID WC, Dwana Melena, MD, 800 mg at 03/31/22 1722 ?  ?Patients Current Diet:  ?Diet Order   ?  ?         ?    Diet Carb Modified Fluid consistency: Thin; Room service appropriate? Yes  Diet effective now       ?  ?  ?   ?  ?  ?   ?  ?  ?Precautions / Restrictions ?Precautions ?Precautions: Fall ?Restrictions ?Weight Bearing Restrictions: Yes ?LUE Weight Bearing: Non weight bearing  ?  ?Has the patient had 2 or more falls or a fall with injury in the past year? Yes ?  ?Prior Activity Level ?Limited Community (1-2x/wk): at least 3 days/week for dialysis ?  ?Prior Functional Level ?Self Care: Did the patient need help bathing, dressing, using the toilet or eating? Independent ?  ?Indoor Mobility: Did the patient need assistance with walking from room to room (with or without device)? Independent ?  ?Stairs: Did the patient need assistance with internal or external stairs (with or without device)? Dependent ?  ?Functional Cognition: Did the patient need help planning regular tasks such as shopping or remembering to take medications? Needed some help ?  ?Patient Information ?Are you of Hispanic, Latino/a,or Spanish origin?: A. No, not of Hispanic, Latino/a, or Spanish origin ?What is your race?: A. White ?Do you need or want an interpreter to communicate with a doctor or health care staff?: 0. No ?  ?Patient's Response To:  ?Health Literacy and Transportation ?Is the patient able to respond to health literacy and transportation needs?: Yes ?Health Literacy - How often do you need to have someone help you when you  read instructions, pamphlets, or other written material from your doctor or pharmacy?: Always (son reads medical material for pt) ?In the past 12 months, has lack of  transportation kept you from medical appointments or from getting medications?: No ?In the past 12 months, has lack of transportation kept you from meetings, work, or from getting things needed for daily living?: No ?  ?Hom

## 2022-04-01 NOTE — Discharge Summary (Signed)
Physician Discharge Summary  ?Christina Mercado WHQ:[id number] DOB: Apr 25, 1936 DOA: 03/29/2022 ? ?PCP: Myrtie Soman, MD ? ?Admit date: 03/29/2022 ?Discharge date: 04/01/2022 ? ?[place] ?Disposition:  CIR ? ?Recommendations for Outpatient Follow-up:  ?Follow up with PCP in 1-2 weeks ?Please obtain BMP/CBC in one week ?Please follow up [place] ?Home Health:NONE ?Equipment/Devices:NONE ? ?Discharge Condition:STABLE  ?CODE STATUS:FULL ?Diet recommendation:CARDIAC ?Brief/Interim Summary: ? ?86 year old female was transferred from [facility] with left elbow fracture after a fall at home.  She tripped and fell and sustained a fracture of her left elbow.  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 ?Patient had a new vascular access placed while she was in the ED at [facility] since her prior hemodialysis access was just proximal to her fracture of the left elbow.  She was taken to the OR by Dr. Doreatha Martin on 03/29/2022.  She is status post ORIF of the left supracondylar distal humerus fracture.  She is nonweightbearing to the left upper extremity.  Ortho recommends to leave Aquacel in place.  Her pain is controlled with standing dose of Tylenol and as needed oxycodone. ?Discharge Diagnoses:  ?Principal Problem: ?  Left elbow fracture ?Active Problems: ?  ESRD on dialysis [facility]) ?  Acquired hypothyroidism ?  Rheumatoid arthritis, seropositive ([place]) ?  DM (diabetes mellitus), type 2 with renal complications ([place]) ? ? ?#1 status post left elbow fracture ORIF Dr. Doreatha Martin on 03/29/2022. ?Ortho recommends ?Weightbearing: NWB LUE ?ROM: Okay for unrestricted elbow range of motion ?Incisional and dressing care: Leave Aquacel in place ?Showering: Ok to shower, Aquacel may get wet ?Orthopedic device(s):  Sling LUE for comfort   ?Pain management:  ?1. Tylenol 650 mg q 6 hours scheduled ?3. Oxycodone 5-10 mg q 4 hours PRN ? ?Follow-up in 2 weeks after discharge   ? ?#2 Klebsiella oxytoca UTI she received 5 doses of IV Rocephin and finish the course. ? ?#3 ESRD on dialysis ? ?#4 history of DVT and PE patient was on Eliquis in the past which has been discontinued due to risk of falls. ? ?#5 multiple myeloma on chemotherapy Ninlaro.  The patient's son has a prescription for Ninlaro and Decadron which he brings it in and administers per Dr. Mertha Finders recommendation. ?Family to call Dr. Mertha Finders office on Monday and decide when in the next dose should be.  She was supposed to get a dose this week however it was on hold due to surgery per Dr. Mertha Finders recommendation. ?Continue acyclovir. ? ?#6 type 2 diabetes diet controlled continue carb modified diet ? ?#7 history of essential hypertension continue Norvasc ? ?#8 hypothyroidism on Synthroid ? ?#9 hyperlipidemia on statin ? ?#10 rheumatoid arthritis stable ? ?#11 anxiety and depression on duloxetine ? ? ?Estimated body mass index is 23.99 kg/m? as calculated from the following: ?  Height as of this encounter: $RemoveBeforeD'5\' 5"'[id number]$  (1.651 m). ?  Weight as of this encounter: 65.4 kg. ? ?Discharge Instructions ? ?Discharge Instructions   ? ? Diet - low sodium heart healthy   Complete by: As directed ?  ? Increase activity slowly   Complete by: As directed ?  ? No wound care   Complete by: As directed ?  ? ?  ? ?Allergies as of 04/01/2022   ? ?   Reactions  ? Byetta 10 Mcg Pen [exenatide] Nausea And Vomiting, Other (See Comments)  ? Weight loss  ? Ibuprofen Swelling  ? Arm swelling - not  sure if it was the ibuprofen but she uses tylenol instead. Is able to take Mobic so this is not a class allergy.  ? Lisinopril Rash  ? Aspirin Other (See Comments)  ? Unknown  ? ?  ? ?  ?Medication List  ?  ? ?STOP taking these medications   ? ?calcium gluconate 500 MG tablet ?  ? ?  ? ?TAKE these medications   ? ?acetaminophen 500 MG tablet ?Commonly known as: TYLENOL ?Take 1,000 mg by mouth every 8 (eight) hours as needed for moderate pain. ?  ?acyclovir 200 MG  capsule ?Commonly known as: ZOVIRAX ?Take 200 mg by mouth daily. ?  ?amLODipine 2.5 MG tablet ?Commonly known as: NORVASC ?Take 2.5 mg by mouth daily. ?  ?atorvastatin 40 MG tablet ?Commonly known as: LIPITOR ?Take 40 mg by mouth daily at 6 PM. ?  ?calcitRIOL 0.25 MCG capsule ?Commonly known as: ROCALTROL ?Take 1 capsule (0.25 mcg total) by mouth 2 (two) times a week. ?Start taking on: April 03, 2022 ?  ?dexamethasone 4 MG tablet ?Commonly known as: DECADRON ?Take 8 mg by mouth See admin instructions. Once a week, before Ninlaro treatment. ?  ?docusate sodium 100 MG capsule ?Commonly known as: COLACE ?Take 1 capsule (100 mg total) by mouth 2 (two) times daily. ?What changed:  ?when to take this ?reasons to take this ?  ?DULoxetine 60 MG capsule ?Commonly known as: CYMBALTA ?Take 60 mg by mouth daily. ?  ?ferrous sulfate 325 (65 FE) MG tablet ?Take 325 mg by mouth daily with breakfast. ?  ?hydrOXYzine 10 MG tablet ?Commonly known as: ATARAX ?Take 10 mg by mouth 3 (three) times daily as needed for itching. ?  ?levothyroxine 150 MCG tablet ?Commonly known as: SYNTHROID ?Take 150 mcg by mouth daily before breakfast. ?  ?lidocaine-prilocaine cream ?Commonly known as: EMLA ?Apply 1 application topically as needed (dialysis access). ?  ?melatonin 5 MG Tabs ?Take 1 tablet (5 mg total) by mouth at bedtime as needed (insomnia). ?  ?Ninlaro 3 MG capsule ?Generic drug: ixazomib citrate ?Take 3 mg by mouth once a week. Once a week for 3 weeks in a 28 day cycle ?  ?ondansetron 8 MG tablet ?Commonly known as: ZOFRAN ?Take 8 mg by mouth every 8 (eight) hours as needed for nausea or vomiting. ?  ?Oxycodone HCl 10 MG Tabs ?Take 5-10 mg by mouth 5 (five) times daily. ?  ?polyethylene glycol 17 g packet ?Commonly known as: MIRALAX / GLYCOLAX ?Take 17 g by mouth daily as needed for moderate constipation. ?  ?sevelamer carbonate 800 MG tablet ?Commonly known as: RENVELA ?Take 1 tablet (800 mg total) by mouth 3 (three) times daily with  meals. ?  ?vitamin B-12 1000 MCG tablet ?Commonly known as: CYANOCOBALAMIN ?Take 1,000 mcg by mouth every morning. ?  ?vitamin C 1000 MG tablet ?Take 1,000 mg by mouth daily. ?  ?Vitamin D3 25 MCG (1000 UT) Caps ?Take 2,000 Units by mouth every morning. ?  ? ?  ? ? Follow-up Information   ? ? Haddix, Thomasene Lot, MD. Schedule an appointment as soon as possible for a visit in 2 week(s).   ?Specialty: Orthopedic Surgery ?Why: for wound check, repeat x-rays, suture removal ?Contact information: ?[address] ?[place] [address] ?[phone] ? ? ?  ?  ? ?  ?  ? ?  ? ?Allergies  ?Allergen Reactions  ? Byetta 10 Mcg Pen [Exenatide] Nausea And Vomiting and Other (See Comments)  ?  Weight  loss ?  ? Ibuprofen Swelling  ?  Arm swelling - not sure if it was the ibuprofen but she uses tylenol instead. Is able to take Mobic so this is not a class allergy.  ? Lisinopril Rash  ? Aspirin Other (See Comments)  ?  Unknown ?  ? ? ?Consultations: ?[facility] ? ? ?Procedures/Studies: ?DG Elbow 2 Views Left ? ?Result Date: 03/29/2022 ?CLINICAL DATA:  Fracture, postoperative EXAM: LEFT ELBOW - 2 VIEW COMPARISON:  03/28/2022 FINDINGS: Status post interval plate and screw fixation of a previously noted left distal humerus fracture. The fracture components appear to be in near anatomic alignment. No perihardware lucency hardware fracture, or perihardware fracture. Air within the soft tissues is not unexpected post surgically. IMPRESSION: Expected postoperative appearance, status post ORIF of a left distal humerus fracture. Electronically Signed   By: Merilyn Baba M.D.   On: 03/29/2022 13:57  ? ?DG ELBOW COMPLETE LEFT (3+VIEW) ? ?Result Date: 03/29/2022 ?CLINICAL DATA:  Intraoperative imaging of fracture fixation EXAM: LEFT ELBOW - COMPLETE 3+ VIEW COMPARISON:  Yesterday FINDINGS: Multiple intraoperative images which demonstrate placement of 2 plate and screw fixation devices across the previously described distal humerus fracture.  Alignment is significantly improved. One of the plates is identified medially i and n the other is identified posteriorly. IMPRESSION: Intraoperative imaging of distal humerus fixation. Electronically Signed   By: Hoover Browns

## 2022-04-01 NOTE — Progress Notes (Addendum)
Inpatient Rehabilitation Admission Medication Review by a Pharmacist ? ?A complete drug regimen review was completed for this patient to identify any potential clinically significant medication issues. ? ?High Risk Drug Classes Is patient taking? Indication by Medication  ?Antipsychotic Yes Hydroxyzine PRN anxiety  ?Anticoagulant Yes Heparin sq for VTE prophylaxis  ?Antibiotic Yes, as an intravenous medication Ceftriaxone for UTI,  Acyclovir oral for prevention of herpes zoster in setting of multiple myeloma  ?Opioid Yes Chronic pain syndrome  ?Antiplatelet No   ?Hypoglycemics/insulin Yes Diabetes type 2: Sliding scale insulin aspart.  ?Vasoactive Medication Yes Hydralazine IV PRN SBP >160: Hypertension  ?Chemotherapy Yes, Oral Chemotherapy Ninlaro: [facility] inpatient rehab MD/PA to follow up  with Dr. Tracey Harries on Monday 04/03/22 t decide when next dose due.  ?Other Yes Sevelamer: hyperphosphatemia (ESRD)  ? ? ? ?Type of Medication Issue Identified Description of Issue Recommendation(s)  ?Drug Interaction(s) (clinically significant) ?    ?Duplicate Therapy ?    ?Allergy ?    ?No Medication Administration End Date ?    ?Incorrect Dose ?    ?Additional Drug Therapy Needed ? Levothyroxine, norvasc, atorvastatin, duloxetine. Resume these medications as appropriate.   Consider check TSH level.   ?Significant med changes from prior encounter (inform family/care partners about these prior to discharge).    ?Other ? [place] discharge summary, MD noted: multiple myeloma on chemotherapy Ninlaro.  The patient's son has a prescription for Ninlaro and Decadron which he brings it in and administers per Dr. Mertha Finders recommendation. ?Family to call Dr. Mertha Finders office on Monday and decide when in the next dose should be.  She was supposed to get a dose this week however it was on hold due to surgery per Dr. Mertha Finders recommendation. [facility] inpatient rehab MD/PA to follow up with Dr. Tracey Harries on Monday 04/03/22  ? ? ?Clinically significant medication issues were  identified that warrant physician communication and completion of prescribed/recommended actions by midnight of the next day:  No ? Dr. Dagoberto Ligas noted 04/01/22:  HTN- actually been having some orthostatic hypotension- will defer to renal to help.  Follow up with MD on 04/02/22.  ? ?Discharge summary from [facility] on 04/01/22 indicates:   ?- history of essential hypertension continue Norvasc ?-hypothyroidism on Synthroid  (150 mcg daily) ?erlipidemia on statin ( atorvastatin) ?-anxiety and depression on duloxetine ? ?Follow up with MD on 04/02/22 for restart of synthroid, norvasc, atorvastatin, duloxetine  ? ? ?Provider Method of Notification:  ? ? ? ?Pharmacist comments:   Pharmacist will follow up with MD on 04/02/22. ? ?Time spent performing this drug regimen review (minutes):  60 minutes ? ?Nicole Cella, RPh ?Clinical Pharmacist  ?04/01/2022 9:35 PM ?

## 2022-04-01 NOTE — Progress Notes (Signed)
removed 1348ms net fluid unable to pull more hypotension last 23 minutes 80 systolic without symptom.  pre bp 153/73  post 80/58  pre weight 65.4kg post weight 64.0kg bed scales catheter ran well changed dressing to right chest.  packed with heparin clamped and capped. ? ?

## 2022-04-02 DIAGNOSIS — S42415A Nondisplaced simple supracondylar fracture without intercondylar fracture of left humerus, initial encounter for closed fracture: Secondary | ICD-10-CM

## 2022-04-02 LAB — GLUCOSE, CAPILLARY
Glucose-Capillary: 100 mg/dL — ABNORMAL HIGH (ref 70–99)
Glucose-Capillary: 101 mg/dL — ABNORMAL HIGH (ref 70–99)
Glucose-Capillary: 117 mg/dL — ABNORMAL HIGH (ref 70–99)
Glucose-Capillary: 150 mg/dL — ABNORMAL HIGH (ref 70–99)
Glucose-Capillary: 82 mg/dL (ref 70–99)

## 2022-04-02 LAB — CBC WITH DIFFERENTIAL/PLATELET
Abs Immature Granulocytes: 0.12 10*3/uL — ABNORMAL HIGH (ref 0.00–0.07)
Basophils Absolute: 0.1 10*3/uL (ref 0.0–0.1)
Basophils Relative: 1 %
Eosinophils Absolute: 0.3 10*3/uL (ref 0.0–0.5)
Eosinophils Relative: 5 %
HCT: 30.6 % — ABNORMAL LOW (ref 36.0–46.0)
Hemoglobin: 9.8 g/dL — ABNORMAL LOW (ref 12.0–15.0)
Immature Granulocytes: 2 %
Lymphocytes Relative: 21 %
Lymphs Abs: 1.5 10*3/uL (ref 0.7–4.0)
MCH: 31.1 pg (ref 26.0–34.0)
MCHC: 32 g/dL (ref 30.0–36.0)
MCV: 97.1 fL (ref 80.0–100.0)
Monocytes Absolute: 0.8 10*3/uL (ref 0.1–1.0)
Monocytes Relative: 12 %
Neutro Abs: 4.3 10*3/uL (ref 1.7–7.7)
Neutrophils Relative %: 59 %
Platelets: 190 10*3/uL (ref 150–400)
RBC: 3.15 MIL/uL — ABNORMAL LOW (ref 3.87–5.11)
RDW: 14.8 % (ref 11.5–15.5)
WBC: 7.2 10*3/uL (ref 4.0–10.5)
nRBC: 0 % (ref 0.0–0.2)

## 2022-04-02 LAB — COMPREHENSIVE METABOLIC PANEL
ALT: <5 U/L (ref 0–44)
AST: 16 U/L (ref 15–41)
Albumin: 2.5 g/dL — ABNORMAL LOW (ref 3.5–5.0)
Alkaline Phosphatase: 61 U/L (ref 38–126)
Anion gap: 10 (ref 5–15)
BUN: 36 mg/dL — ABNORMAL HIGH (ref 8–23)
CO2: 26 mmol/L (ref 22–32)
Calcium: 9.3 mg/dL (ref 8.9–10.3)
Chloride: 100 mmol/L (ref 98–111)
Creatinine, Ser: 4.16 mg/dL — ABNORMAL HIGH (ref 0.44–1.00)
GFR, Estimated: 10 mL/min — ABNORMAL LOW (ref >60)
Glucose, Bld: 91 mg/dL (ref 70–99)
Potassium: 5.4 mmol/L — ABNORMAL HIGH (ref 3.5–5.1)
Sodium: 136 mmol/L (ref 135–145)
Total Bilirubin: 0.4 mg/dL (ref 0.3–1.2)
Total Protein: 5.1 g/dL — ABNORMAL LOW (ref 6.5–8.1)

## 2022-04-02 LAB — CULTURE, BLOOD (SINGLE)
Culture: NO GROWTH
Special Requests: ADEQUATE

## 2022-04-02 LAB — BASIC METABOLIC PANEL
Anion gap: 13 (ref 5–15)
BUN: 46 mg/dL — ABNORMAL HIGH (ref 8–23)
CO2: 29 mmol/L (ref 22–32)
Calcium: 9.4 mg/dL (ref 8.9–10.3)
Chloride: 94 mmol/L — ABNORMAL LOW (ref 98–111)
Creatinine, Ser: 4.72 mg/dL — ABNORMAL HIGH (ref 0.44–1.00)
GFR, Estimated: 9 mL/min — ABNORMAL LOW (ref >60)
Glucose, Bld: 145 mg/dL — ABNORMAL HIGH (ref 70–99)
Potassium: 4.4 mmol/L (ref 3.5–5.1)
Sodium: 136 mmol/L (ref 135–145)

## 2022-04-02 LAB — MAGNESIUM: Magnesium: 1.9 mg/dL (ref 1.7–2.4)

## 2022-04-02 LAB — PHOSPHORUS: Phosphorus: 5.6 mg/dL — ABNORMAL HIGH (ref 2.5–4.6)

## 2022-04-02 MED ORDER — SODIUM CHLORIDE 0.9 % IV SOLN
250.0000 mg | INTRAVENOUS | Status: DC
  Administered 2022-04-08: 250 mg via INTRAVENOUS
  Filled 2022-04-02 (×3): qty 20

## 2022-04-02 MED ORDER — AMLODIPINE BESYLATE 2.5 MG PO TABS
2.5000 mg | ORAL_TABLET | Freq: Every day | ORAL | Status: DC
  Administered 2022-04-02 – 2022-04-10 (×8): 2.5 mg via ORAL
  Filled 2022-04-02 (×8): qty 1

## 2022-04-02 MED ORDER — SODIUM ZIRCONIUM CYCLOSILICATE 10 G PO PACK
10.0000 g | PACK | Freq: Once | ORAL | Status: AC
  Administered 2022-04-02: 10 g via ORAL
  Filled 2022-04-02: qty 1

## 2022-04-02 MED ORDER — INSULIN ASPART 100 UNIT/ML IJ SOLN
0.0000 [IU] | Freq: Three times a day (TID) | INTRAMUSCULAR | Status: DC
  Administered 2022-04-06: 1 [IU] via SUBCUTANEOUS

## 2022-04-02 MED ORDER — DULOXETINE HCL 60 MG PO CPEP
60.0000 mg | ORAL_CAPSULE | Freq: Every day | ORAL | Status: DC
  Administered 2022-04-02 – 2022-04-10 (×9): 60 mg via ORAL
  Filled 2022-04-02 (×9): qty 1

## 2022-04-02 MED ORDER — LEVOTHYROXINE SODIUM 75 MCG PO TABS
150.0000 ug | ORAL_TABLET | Freq: Every day | ORAL | Status: DC
  Administered 2022-04-02 – 2022-04-10 (×9): 150 ug via ORAL
  Filled 2022-04-02 (×9): qty 2

## 2022-04-02 MED ORDER — ATORVASTATIN CALCIUM 40 MG PO TABS
40.0000 mg | ORAL_TABLET | Freq: Every day | ORAL | Status: DC
  Administered 2022-04-02 – 2022-04-09 (×8): 40 mg via ORAL
  Filled 2022-04-02 (×8): qty 1

## 2022-04-02 NOTE — Plan of Care (Signed)
?  Problem: RH Balance ?Goal: LTG Patient will maintain dynamic sitting balance (PT) ?Description: LTG:  Patient will maintain dynamic sitting balance with assistance during mobility activities (PT) ?Flowsheets (Taken 04/02/2022 1335) ?LTG: Pt will maintain dynamic sitting balance during mobility activities with:: Independent with assistive device  ?Goal: LTG Patient will maintain dynamic standing balance (PT) ?Description: LTG:  Patient will maintain dynamic standing balance with assistance during mobility activities (PT) ?Flowsheets (Taken 04/02/2022 1335) ?LTG: Pt will maintain dynamic standing balance during mobility activities with:: Supervision/Verbal cueing ?  ?Problem: Sit to Stand ?Goal: LTG:  Patient will perform sit to stand with assistance level (PT) ?Description: LTG:  Patient will perform sit to stand with assistance level (PT) ?Flowsheets (Taken 04/02/2022 1335) ?LTG: PT will perform sit to stand in preparation for functional mobility with assistance level: Supervision/Verbal cueing ?  ?Problem: RH Bed Mobility ?Goal: LTG Patient will perform bed mobility with assist (PT) ?Description: LTG: Patient will perform bed mobility with assistance, with/without cues (PT). ?Flowsheets (Taken 04/02/2022 1335) ?LTG: Pt will perform bed mobility with assistance level of: Supervision/Verbal cueing ?  ?Problem: RH Bed to Chair Transfers ?Goal: LTG Patient will perform bed/chair transfers w/assist (PT) ?Description: LTG: Patient will perform bed to chair transfers with assistance (PT). ?Flowsheets (Taken 04/02/2022 1335) ?LTG: Pt will perform Bed to Chair Transfers with assistance level: Supervision/Verbal cueing ?  ?Problem: RH Car Transfers ?Goal: LTG Patient will perform car transfers with assist (PT) ?Description: LTG: Patient will perform car transfers with assistance (PT). ?Flowsheets (Taken 04/02/2022 1335) ?LTG: Pt will perform car transfers with assist:: Contact Guard/Touching assist ?  ?Problem: RH  Ambulation ?Goal: LTG Patient will ambulate in controlled environment (PT) ?Description: LTG: Patient will ambulate in a controlled environment, # of feet with assistance (PT). ?Flowsheets (Taken 04/02/2022 1335) ?LTG: Pt will ambulate in controlled environ  assist needed:: Supervision/Verbal cueing ?LTG: Ambulation distance in controlled environment: 18f ?Goal: LTG Patient will ambulate in home environment (PT) ?Description: LTG: Patient will ambulate in home environment, # of feet with assistance (PT). ?Flowsheets (Taken 04/02/2022 1335) ?LTG: Pt will ambulate in home environ  assist needed:: Supervision/Verbal cueing ?LTG: Ambulation distance in home environment: 266f?  ?Problem: RH Wheelchair Mobility ?Goal: LTG Patient will propel w/c in controlled environment (PT) ?Description: LTG: Patient will propel wheelchair in controlled environment, # of feet with assist (PT) ?Flowsheets (Taken 04/02/2022 1335) ?LTG: Pt will propel w/c in controlled environ  assist needed:: Supervision/Verbal cueing ?LTG: Propel w/c distance in controlled environment: 7521fGoal: LTG Patient will propel w/c in home environment (PT) ?Description: LTG: Patient will propel wheelchair in home environment, # of feet with assistance (PT). ?Flowsheets (Taken 04/02/2022 1335) ?LTG: Pt will propel w/c in home environ  assist needed:: Supervision/Verbal cueing ?LTG: Propel w/c distance in home environment: 21f32f ?

## 2022-04-02 NOTE — Evaluation (Signed)
Occupational Therapy Assessment and Plan ? ?Patient Details  ?Name: Christina Mercado ?MRN: [id number] ?Date of Birth: June 17, 1936 ? ?OT Diagnosis: abnormal posture, acute pain, cognitive deficits, and muscle weakness (generalized) ?Rehab Potential:   ?ELOS: 10-12 days  ? ?Today's Date: 04/02/2022 ?OT Individual Time: 7408-1448 ?OT Individual Time Calculation (min): 55 min    ? ?Hospital Problem: Principal Problem: ?  Left humeral fracture ? ? ?Past Medical History:  ?Past Medical History:  ?Diagnosis Date  ? Arthritis   ? Diabetes mellitus without complication ([place])   ? Hypertension   ? Kidney failure   ? stage 3  ? Proteinuria   ? ?Past Surgical History:  ?Past Surgical History:  ?Procedure Laterality Date  ? DIALYSIS/PERMA CATHETER INSERTION N/A 05/30/2021  ? Procedure: DIALYSIS/PERMA CATHETER INSERTION;  Surgeon: Algernon Huxley, MD;  Location: [facility];  Service: Cardiovascular;  Laterality: N/A;  ? DIALYSIS/PERMA CATHETER INSERTION N/A 03/28/2022  ? Procedure: DIALYSIS/PERMA CATHETER INSERTION;  Surgeon: Evaristo Bury, MD;  Location: [facility];  Service: Cardiovascular;  Laterality: N/A;  ? DIALYSIS/PERMA CATHETER REMOVAL N/A 08/02/2021  ? Procedure: DIALYSIS/PERMA CATHETER REMOVAL;  Surgeon: Katha Cabal, MD;  Location: [facility];  Service: Cardiovascular;  Laterality: N/A;  ? EYE SURGERY    ? ORIF ELBOW FRACTURE Left 03/29/2022  ? Procedure: OPEN REDUCTION INTERNAL FIXATION (ORIF) LEFT ELBOW/OLECRANON FRACTURE;  Surgeon: Shona Needles, MD;  Location: [facility];  Service: Orthopedics;  Laterality: Left;  ? RENAL BIOPSY    ? TOTAL HIP ARTHROPLASTY Right   ? ? ?Assessment & Plan ?Clinical Impression: Patient is a 86 y.o. year old female with recent admission to the hospital on 03/29/22 with a fall in her ILF resulting in L humeral fracture, underwent ORIF, now LUE NWB.  Patient transferred to CIR on 04/01/2022 .   ? ?Patient currently requires mod-max with basic self-care skills  secondary to muscle weakness, decreased cardiorespiratoy endurance, impaired timing and sequencing, decreased coordination, and decreased motor planning, decreased motor planning, decreased attention, decreased awareness, decreased problem solving, decreased safety awareness, and decreased memory, and decreased sitting balance, decreased standing balance, decreased postural control, decreased balance strategies, and difficulty maintaining precautions.  Prior to hospitalization, patient could complete BADL and IADL with modified independent . ? ?Patient will benefit from skilled intervention to decrease level of assist with basic self-care skills and increase independence with basic self-care skills prior to discharge home with care partner.  Anticipate patient will require 24 hour supervision and follow up home health. ? ?OT - End of Session ?Activity Tolerance: Tolerates 10 - 20 min activity with multiple rests ?Endurance Deficit: Yes ?OT Assessment ?OT Barriers to Discharge: Weight bearing restrictions ?OT Patient demonstrates impairments in the following area(s): Balance;Behavior;Cognition;Edema;Endurance;Motor;Pain;Perception;Safety ?OT Basic ADL's Functional Problem(s): Eating;Grooming;Bathing;Dressing;Toileting ?OT Advanced ADL's Functional Problem(s): Simple Meal Preparation ?OT Transfers Functional Problem(s): Toilet;Tub/Shower ?OT Additional Impairment(s): None ?OT Plan ?OT Intensity: Minimum of 1-2 x/day, 45 to 90 minutes ?OT Frequency: 5 out of 7 days ?OT Duration/Estimated Length of Stay: 10-12 days ?OT Treatment/Interventions: Balance/vestibular training;Disease mangement/prevention;Neuromuscular re-education;Self Care/advanced ADL retraining;Therapeutic Exercise;UE/LE Strength taining/ROM;DME/adaptive equipment instruction;Cognitive remediation/compensation;Pain management;Skin care/wound managment;UE/LE Coordination activities;Splinting/orthotics;Patient/family education;Functional electrical  stimulation;Community reintegration;Discharge planning;Functional mobility training;Psychosocial support;Therapeutic Activities;Visual/perceptual remediation/compensation ?OT Self Feeding Anticipated Outcome(s): Mod I ?OT Basic Self-Care Anticipated Outcome(s): Supervision ?OT Toileting Anticipated Outcome(s): Supervision ?OT Bathroom Transfers Anticipated Outcome(s): Supervision ?OT Recommendation ?Recommendations for Other Services: Speech consult ?Patient destination: Home (ILF) ?Follow Up Recommendations: Home health OT ?Equipment Recommended: To be determined ? ? ?  OT Evaluation ?Precautions/Restrictions  ?Precautions ?Precautions: Fall ?Precaution Comments: LUE NWB ?Restrictions ?Weight Bearing Restrictions: Yes ?LUE Weight Bearing: Non weight bearing ?Pain ?Pain Assessment ?Pain Scale: 0-10 ?Pain Score: 5  ?Home Living/Prior Functioning ?Home Living ?Available Help at Discharge: Family ?Type of Home: Independent living facility ?Home Access: Level entry ?Home Layout: One level ?Bathroom Shower/Tub: Walk-in shower ?Bathroom Toilet: Handicapped height ?Bathroom Accessibility: Yes ?Prior Function ?Level of Independence: Independent with basic ADLs, Independent with gait, Independent with homemaking with ambulation, Requires assistive device for independence, Independent with transfers ?Vision ?Baseline Vision/History: 1 Wears glasses ?Ability to See in Adequate Light: 0 Adequate ?Patient Visual Report: No change from baseline ?Vision Assessment?: No apparent visual deficits ?Perception  ?Perception: Within Functional Limits ?Praxis ?Praxis: Intact ?Cognition ?Cognition ?Overall Cognitive Status: No family/caregiver present to determine baseline cognitive functioning ?Arousal/Alertness: Awake/alert ?Orientation Level: Person;Situation ?Memory: Impaired ?Memory Impairment: Storage deficit;Retrieval deficit;Decreased short term memory ?Awareness: Impaired ?Problem Solving: Impaired ?Safety/Judgment: Impaired ?Brief  Interview for Mental Status (BIMS) ?Repetition of Three Words (First Attempt): 3 ?Temporal Orientation: Year: Missed by 1 year ?Temporal Orientation: Month: Accurate within 5 days ?Temporal Orientation: Day: Incorrect ?Recall: "Sock": Yes, no cue required ?Recall: "Blue": Yes, no cue required ?Recall: "Bed": No, could not recall ?BIMS Summary Score: 11 ?Sensation ?Sensation ?Light Touch: Appears Intact ?Coordination ?Gross Motor Movements are Fluid and Coordinated: No ?Fine Motor Movements are Fluid and Coordinated: No ?Coordination and Movement Description: limited coordination 2/2 LUE NWB and ACE wrap, edema in L digits ?Finger Nose Finger Test: unable to complete 2/2 LUE in ace wrap and NWB/painful ?Motor  ?Motor ?Motor: Abnormal postural alignment and control ?Motor - Skilled Clinical Observations: limited 2/2 LUE NWB status and ace wrap  ?Trunk/Postural Assessment  ?Cervical Assessment ?Cervical Assessment: Within Functional Limits ?Thoracic Assessment ?Thoracic Assessment: Exceptions to [facility] (kyphotic posture) ?Lumbar Assessment ?Lumbar Assessment: Exceptions to [facility] (posterior tilt) ?Postural Control ?Postural Control: Deficits on evaluation  ?Balance ?Balance ?Balance Assessed: Yes ?Static Sitting Balance ?Static Sitting - Balance Support: Feet unsupported ?Static Sitting - Level of Assistance: 5: Stand by assistance ?Dynamic Sitting Balance ?Dynamic Sitting - Balance Support: During functional activity;Right upper extremity supported ?Dynamic Sitting - Level of Assistance: 4: Min assist ?Dynamic Sitting - Balance Activities: Lateral lean/weight shifting;Forward lean/weight shifting ?Static Standing Balance ?Static Standing - Balance Support: Right upper extremity supported ?Static Standing - Level of Assistance: 3: Mod assist ?Dynamic Standing Balance ?Dynamic Standing - Balance Support: Right upper extremity supported ?Dynamic Standing - Level of Assistance: 3: Mod assist ?Dynamic Standing - Balance  Activities: Lateral lean/weight shifting;Forward lean/weight shifting ?Extremity/Trunk Assessment ?RUE Assessment ?RUE Assessment: Within Functional Limits ?LUE Assessment ?LUE Assessment: Exceptions to [facility] ?Active Range o

## 2022-04-02 NOTE — Discharge Instructions (Signed)
Inpatient Rehab Discharge Instructions ? ?[place] [place] ?Discharge date and time: No discharge date for patient encounter.  ? ?Activities/Precautions/ Functional Status: ?Activity: Nonweightbearing left upper extremity ?Diet: Renal diet 1200 mL fluid restriction ?Wound Care: Routine skin checks ?Functional status:  ?___ No restrictions     ___ Walk up steps independently ?___ 24/7 supervision/assistance   ___ Walk up steps with assistance ?___ Intermittent supervision/assistance  ___ Bathe/dress independently ?___ Walk with walker     _x__ Bathe/dress with assistance ?___ Walk Independently    ___ Shower independently ?___ Walk with assistance    ___ Shower with assistance ?___ No alcohol     ___ Return to work/school ________ ? ?Special Instructions: ?No driving smoking or alcohol ? ?Continue hemodialysis at [facility] ? ? ?My questions have been answered and I understand these instructions. I will adhere to these goals and the provided educational materials after my discharge from the hospital. ? ?Patient/Caregiver Signature _______________________________ Date __________ ? ?Clinician Signature _______________________________________ Date __________ ? ?Please bring this form and your medication list with you to all your follow-up doctor's appointments.   ?

## 2022-04-02 NOTE — Plan of Care (Signed)
?  Problem: RH Balance ?Goal: LTG Patient will maintain dynamic standing with ADLs (OT) ?Description: LTG:  Patient will maintain dynamic standing balance with assist during activities of daily living (OT)  ?Flowsheets (Taken 04/02/2022 1028) ?LTG: Pt will maintain dynamic standing balance during ADLs with: Supervision/Verbal cueing ?  ?Problem: Sit to Stand ?Goal: LTG:  Patient will perform sit to stand in prep for activites of daily living with assistance level (OT) ?Description: LTG:  Patient will perform sit to stand in prep for activites of daily living with assistance level (OT) ?Flowsheets (Taken 04/02/2022 1028) ?LTG: PT will perform sit to stand in prep for activites of daily living with assistance level: Supervision/Verbal cueing ?  ?Problem: RH Eating ?Goal: LTG Patient will perform eating w/assist, cues/equip (OT) ?Description: LTG: Patient will perform eating with assist, with/without cues using equipment (OT) ?Flowsheets (Taken 04/02/2022 1028) ?LTG: Pt will perform eating with assistance level of: Independent with assistive device  ?  ?Problem: RH Grooming ?Goal: LTG Patient will perform grooming w/assist,cues/equip (OT) ?Description: LTG: Patient will perform grooming with assist, with/without cues using equipment (OT) ?Flowsheets (Taken 04/02/2022 1028) ?LTG: Pt will perform grooming with assistance level of: Set up assist  ?  ?Problem: RH Bathing ?Goal: LTG Patient will bathe all body parts with assist levels (OT) ?Description: LTG: Patient will bathe all body parts with assist levels (OT) ?Flowsheets (Taken 04/02/2022 1028) ?LTG: Pt will perform bathing with assistance level/cueing: Supervision/Verbal cueing ?  ?Problem: RH Dressing ?Goal: LTG Patient will perform upper body dressing (OT) ?Description: LTG Patient will perform upper body dressing with assist, with/without cues (OT). ?Flowsheets (Taken 04/02/2022 1028) ?LTG: Pt will perform upper body dressing with assistance level of: Supervision/Verbal  cueing ?Goal: LTG Patient will perform lower body dressing w/assist (OT) ?Description: LTG: Patient will perform lower body dressing with assist, with/without cues in positioning using equipment (OT) ?Flowsheets (Taken 04/02/2022 1028) ?LTG: Pt will perform lower body dressing with assistance level of: Supervision/Verbal cueing ?  ?Problem: RH Toileting ?Goal: LTG Patient will perform toileting task (3/3 steps) with assistance level (OT) ?Description: LTG: Patient will perform toileting task (3/3 steps) with assistance level (OT)  ?Flowsheets (Taken 04/02/2022 1028) ?LTG: Pt will perform toileting task (3/3 steps) with assistance level: Supervision/Verbal cueing ?  ?Problem: RH Toilet Transfers ?Goal: LTG Patient will perform toilet transfers w/assist (OT) ?Description: LTG: Patient will perform toilet transfers with assist, with/without cues using equipment (OT) ?Flowsheets (Taken 04/02/2022 1028) ?LTG: Pt will perform toilet transfers with assistance level of: Supervision/Verbal cueing ?  ?Problem: RH Tub/Shower Transfers ?Goal: LTG Patient will perform tub/shower transfers w/assist (OT) ?Description: LTG: Patient will perform tub/shower transfers with assist, with/without cues using equipment (OT) ?Flowsheets (Taken 04/02/2022 1028) ?LTG: Pt will perform tub/shower stall transfers with assistance level of: Supervision/Verbal cueing ?  ?

## 2022-04-02 NOTE — H&P (Signed)
?  ?Physical Medicine and Rehabilitation Admission H&P ?  ?  ?No chief complaint on file. ?:L distal humeral fracture ?  ?HPI: Pt is an 86 yr old female with light chain deposition disease, HTN, DM- insulin dependent; and ESRD on HD T/S only ?Who had ground level fall on 4/18 and brought to ER at [facility]- she was transferred to [facility] by Dr Orma Flaming found she had a distal L humeral/supracondylar/elbow fracture on early 03/29/22. She underwent ORIF by Dr Doreatha Martin 03/29/22 after transfer to [facility]. Marland Kitchen  ?She also had her fistula in LUE near fracture and since would not be able to use for HD, she also had HD catheter placed for HD.  ?Her DM was controlled with SSI-  ?She was made NWB on LUE by Dr Doreatha Martin- after surgery.  ?  ?She was seen by PT and OT for impaired function after fall and difficulty with ADLs and mobility and transferred to CIR on 04/01/22 to work on impaired function so she could then hopefully get home safely.  ?  ?Spoke to son who, although SNF was recommended and there was concern about her fatigue level after Acute therapies, he really felt she would do better in CIR level of care and wanted her transferred. ?  ?Pt reports she's very HOH- pain comes and goes and worse when cold- pain meds working.  ?Vomited last night, but admits she ate a lot of food since "it was so good".  ?LBM 3 days ago before admission.  ?Still voids- oliguric; and per chart had STM deficits and decreased awareness.  ?  ?  ?  ?Review of Systems  ?Constitutional:  Positive for chills and malaise/fatigue.  ?HENT:  Positive for hearing loss. Negative for congestion and ear discharge.   ?Eyes:  Negative for double vision and pain.  ?Respiratory:  Negative for sputum production and shortness of breath.   ?Cardiovascular:  Negative for chest pain and leg swelling.  ?     L hand swelling  ?Gastrointestinal:  Positive for constipation, nausea and vomiting. Negative for abdominal pain and blood in stool.  ?Genitourinary:   Positive for urgency. Negative for dysuria and frequency.  ?Musculoskeletal:  Positive for joint pain.  ?Skin:  Negative for itching and rash.  ?Neurological:  Positive for focal weakness. Negative for tremors and speech change.  ?Endo/Heme/Allergies: Negative.   ?Psychiatric/Behavioral:  Negative for hallucinations and substance abuse. The patient has insomnia.   ?All other systems reviewed and are negative. ?    ?Past Medical History:  ?Diagnosis Date  ? Arthritis    ? Diabetes mellitus without complication ([place])    ? Hypertension    ? Kidney failure    ?  stage 3  ? Proteinuria    ?  ?     ?Past Surgical History:  ?Procedure Laterality Date  ? DIALYSIS/PERMA CATHETER INSERTION N/A 05/30/2021  ?  Procedure: DIALYSIS/PERMA CATHETER INSERTION;  Surgeon: Algernon Huxley, MD;  Location: [place] [facility];  Service: Cardiovascular;  Laterality: N/A;  ? DIALYSIS/PERMA CATHETER INSERTION N/A 03/28/2022  ?  Procedure: DIALYSIS/PERMA CATHETER INSERTION;  Surgeon: Evaristo Bury, MD;  Location: [place] [facility];  Service: Cardiovascular;  Laterality: N/A;  ? DIALYSIS/PERMA CATHETER REMOVAL N/A 08/02/2021  ?  Procedure: DIALYSIS/PERMA CATHETER REMOVAL;  Surgeon: Katha Cabal, MD;  Location: [place] [facility];  Service: Cardiovascular;  Laterality: N/A;  ? EYE SURGERY      ? ORIF ELBOW FRACTURE Left 03/29/2022  ?  Procedure: OPEN REDUCTION INTERNAL FIXATION (ORIF) LEFT ELBOW/OLECRANON FRACTURE;  Surgeon: Shona Needles, MD;  Location: [place];  Service: Orthopedics;  Laterality: Left;  ? RENAL BIOPSY      ? TOTAL HIP ARTHROPLASTY Right    ?  ?History reviewed. No pertinent family history. ?Social History:  reports that she has never smoked. She has never used smokeless tobacco. She reports that she does not drink alcohol and does not use drugs. ?Allergies:  ?     ?Allergies  ?Allergen Reactions  ? Byetta 10 Mcg Pen [Exenatide] Nausea And Vomiting and Other (See Comments)  ?    Weight loss ?   ? Ibuprofen Swelling   ?    Arm swelling - not sure if it was the ibuprofen but she uses tylenol instead. Is able to take Mobic so this is not a class allergy.  ? Lisinopril Rash  ? Aspirin Other (See Comments)  ?    Unknown ?   ?  ?      ?Medications Prior to Admission  ?Medication Sig Dispense Refill  ? acetaminophen (TYLENOL) 500 MG tablet Take 1,000 mg by mouth every 8 (eight) hours as needed for moderate pain.      ? acyclovir (ZOVIRAX) 200 MG capsule Take 200 mg by mouth daily.      ? amLODipine (NORVASC) 2.5 MG tablet Take 2.5 mg by mouth daily.      ? Ascorbic Acid (VITAMIN C) 1000 MG tablet Take 1,000 mg by mouth daily.      ? atorvastatin (LIPITOR) 40 MG tablet Take 40 mg by mouth daily at 6 PM.      ? calcium gluconate 500 MG tablet Take 1 tablet by mouth daily. (Patient not taking: Reported on 03/28/2022)      ? Cholecalciferol (VITAMIN D3) 1000 units CAPS Take 2,000 Units by mouth every morning.      ? dexamethasone (DECADRON) 4 MG tablet Take 8 mg by mouth See admin instructions. Once a week, before Ninlaro treatment.      ? docusate sodium (COLACE) 100 MG capsule Take 1 capsule (100 mg total) by mouth 2 (two) times daily. (Patient taking differently: Take 100 mg by mouth 2 (two) times daily as needed for moderate constipation.) 10 capsule 0  ? DULoxetine (CYMBALTA) 60 MG capsule Take 60 mg by mouth daily.      ? ferrous sulfate 325 (65 FE) MG tablet Take 325 mg by mouth daily with breakfast.      ? hydrOXYzine (ATARAX/VISTARIL) 10 MG tablet Take 10 mg by mouth 3 (three) times daily as needed for itching.      ? levothyroxine (SYNTHROID) 150 MCG tablet Take 150 mcg by mouth daily before breakfast.      ? lidocaine-prilocaine (EMLA) cream Apply 1 application topically as needed (dialysis access).      ? NINLARO 3 MG capsule Take 3 mg by mouth once a week. Once a week for 3 weeks in a 28 day cycle      ? ondansetron (ZOFRAN) 8 MG tablet Take 8 mg by mouth every 8 (eight) hours as needed for nausea or vomiting.      ? Oxycodone  HCl 10 MG TABS Take 5-10 mg by mouth 5 (five) times daily.   0  ? polyethylene glycol (MIRALAX / GLYCOLAX) packet Take 17 g by mouth daily as needed for moderate constipation.      ? vitamin B-12 (CYANOCOBALAMIN) 1000 MCG tablet Take 1,000 mcg by mouth every  morning.      ?  ?  ?  ?  ?Home: ?Home Living ?Family/patient expects to be discharged to:: Private residence ?Living Arrangements: Alone ?Available Help at Discharge: Family ?Type of Home: Independent living facility ?Home Access: Level entry ?Home Layout: One level ?Bathroom Shower/Tub: Walk-in shower ?Bathroom Toilet: Handicapped height ?Bathroom Accessibility: Yes ?Home Equipment: Conservation officer, nature (2 wheels), Shower seat - built in ?  ?Functional History: ?Prior Function ?Prior Level of Function : Independent/Modified Independent ?Mobility Comments: using RW due to hx of hip fx and leg length discrepancy ?ADLs Comments: Indep ?  ?Functional Status:  ?Mobility: ?Bed Mobility ?Overal bed mobility: Needs Assistance ?Bed Mobility: Supine to Sit ?Supine to sit: Mod assist ?Sit to supine: Mod assist ?General bed mobility comments: modA for trunk elevation and repositioning hips towards EOB, pt has a harder time scooting her L hip forward due to not being able to use her LUE ?Transfers ?Overall transfer level: Needs assistance ?Equipment used: 2 person hand held assist ?Transfers: Sit to/from Stand, Bed to chair/wheelchair/BSC ?Sit to Stand: Min assist ?Bed to/from chair/wheelchair/BSC transfer type:: Step pivot ?Step pivot transfers: Min assist, +2 safety/equipment, +2 physical assistance ? Lateral/Scoot Transfers: Min guard ?General transfer comment: Min A +2 to take steps forwards and to the side, heavily reliant on HHA on RUE, quick to fatigue and require a seated rest break. ?  ?ADL: ?ADL ?Overall ADL's : Needs assistance/impaired ?Eating/Feeding: Set up, Sitting ?Grooming: Set up, Sitting ?Upper Body Bathing: Minimal assistance, Sitting ?Lower Body Bathing:  Moderate assistance, Sitting/lateral leans, Sit to/from stand ?Upper Body Dressing : Minimal assistance, Sitting ?Lower Body Dressing: Moderate assistance, Sitting/lateral leans, Sit to/from stand ?Tax inspector

## 2022-04-02 NOTE — Progress Notes (Signed)
? [facility] ?Progress Note  ? ?86 y.o. 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 She usually dialyzes Tues and Thur with [facility] [place] [facility]. Unfortunately she tripped and fell sustaining a supracondylar distal humerus fracture s/p ORIF on 4/19. She actually had a RIJ TC placed by [facility] as the left upper arm access would not be accessible with the fracture + ORIF.  ? ?Editor, commissioning on [place] ?Tues Sat 3hr 15 min EDW 64kg ?Venofer 50qw  Mircera 50 q4 (last 4/11) ?Calcitriol 0.25 BIW ?H1300 bolus 600/h ? ?Assessment/ Plan:   ?ESRD - usually dialyzed Tues and Sat at [facility] on [address] last HD on Tuesday.  ?Tolerated HD on Sat with 1.3L net UF ?Next HD on Tues ? ?No absolute indication for RRT and the patient appears to be  comfortable. ? ?Renal diet; banana on her tray this am. Will give a dose of Lokelma as well. ? ?RIJ TC ?Still good bruit in left arm ? ?Left elbow fracture - appreciate vascular placing a RIJ TC and will need to wait till left arm is accessible before attempting dialysis. She's in a lot of pain and doubt we will be able to access for weeks. S/p ORIF left supracondylar humerus fracture on 4/19.  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  On Mircera as outpatient. 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  Followed at [facility] by Dr. Madelin Rear ? ?Subjective:   ?Still c/o left elbow pain but continues to slowly improve. ?Denies f/c/n/ sob  ? ?Objective:   ?BP (!) 154/57 (BP Location: Right Arm)   Pulse 73   Temp 98.2 ?F (36.8 ?C) (Oral)   Resp 18   Ht '5\' 5"'$  (1.651 m)   Wt 69 kg   SpO2 100%   BMI 25.31 kg/m?  ? ?Intake/Output Summary (Last 24 hours) at 04/02/2022 0702 ?Last data filed at 04/01/2022 1010 ?Gross per 24 hour  ?Intake --  ?Output 1346 ml  ?Net -1346  ml  ? ?Weight change:  ? ?Physical Exam: ?GEN: NAD, A&Ox3, NCAT, pleasant, very hard of hearing ?HEENT: No conjunctival pallor, EOMI ?NECK: Supple, no thyromegaly ?LUNGS: CTA B/L no rales, rhonchi or wheezing ?CV: RRR, No M/R/G ?ABD: SNDNT +BS  ?EXT: No lower extremity edema ?ACCESS: lt arm + bruit even thru bandages, RIJ TC ? ? ? ?Imaging: ?No results found. ? ?Labs: ?BMET ?Recent Labs  ?Lab 03/30/22 ?0213 03/30/22 ?1637 03/31/22 ?0420 03/31/22 ?1637 04/01/22 ?0231 04/01/22 ?1543 04/02/22 ?0867  ?NA 140 138 135 137 135 137 136  ?K 3.8 4.4 4.0 3.8 4.3 3.7 5.4*  ?CL 103 100 102 98 100 97* 100  ?CO2 '25 25 22 24 22 24 26  '$ ?GLUCOSE 177* 124* 101* 163* 94 212* 91  ?BUN 37* 51* 56* 65* 68* 30* 36*  ?CREATININE 5.33* 5.92* 6.25* 6.86* 6.98* 7.47* 4.16*  ?CALCIUM 8.6* 8.6* 8.6* 8.5* 8.6* 11.4* 9.3  ?PHOS 6.0* 6.3* 7.5* 7.0* 7.2* 3.8 5.6*  ? ?CBC ?Recent Labs  ?Lab 03/29/22 ?6195 03/31/22 ?0420 04/01/22 ?0231 04/02/22 ?0932  ?WBC 9.3 8.6 9.5 7.2  ?NEUTROABS 5.7  --   --  4.3  ?HGB 10.8* 9.3* 8.5* 9.8*  ?HCT 33.2* 29.4* 27.5* 30.6*  ?MCV 96.5 97.4 96.5 97.1  ?PLT 195 165 190 190  ? ? ?Medications:   ? ? acetaminophen  650 mg Oral Q6H  ? Chlorhexidine Gluconate Cloth  6 each Topical Daily  ? cholecalciferol  2,000 Units Oral Daily  ? docusate sodium  100 mg Oral BID  ? heparin injection (subcutaneous)  5,000 Units Subcutaneous Q8H  ? insulin aspart  0-6 Units Subcutaneous Q4H  ? sevelamer carbonate  800 mg Oral TID WC  ? ? ? ? ?Otelia Santee, MD ?04/02/2022, 7:02 AM  ? ?

## 2022-04-02 NOTE — Progress Notes (Signed)
Inpatient Rehabilitation Admission Medication Review by a Pharmacist ? ?A complete drug regimen review was completed for this patient to identify any potential clinically significant medication issues. ? ?High Risk Drug Classes Is patient taking? Indication by Medication  ?Antipsychotic Yes Hydroxyzine PRN anxiety  ?Anticoagulant Yes Heparin sq for VTE prophylaxis  ?Antibiotic Yes, as an intravenous medication Ceftriaxone for UTI,  Acyclovir oral for prevention of herpes zoster in setting of multiple myeloma  ?Opioid Yes Chronic pain syndrome  ?Antiplatelet No   ?Hypoglycemics/insulin Yes Diabetes type 2: Sliding scale insulin aspart.  ?Vasoactive Medication Yes Hydralazine IV PRN SBP >160: Hypertension  ?Chemotherapy Yes, Oral Chemotherapy Ninlaro: [facility] inpatient rehab MD/PA to follow up  with Dr. Tracey Harries on Monday 04/03/22 t decide when next dose due.  ?Other Yes Sevelamer: hyperphosphatemia (ESRD)  ? ? ? ?Type of Medication Issue Identified Description of Issue Recommendation(s)  ?Drug Interaction(s) (clinically significant) ?    ?Duplicate Therapy ?    ?Allergy ?    ?No Medication Administration End Date ?    ?Incorrect Dose ?    ?Additional Drug Therapy Needed ? Levothyroxine, norvasc, atorvastatin, duloxetine. Resume these medications as appropriate.   Consider check TSH level.   ?Significant med changes from prior encounter (inform family/care partners about these prior to discharge).    ?Other ? [place] discharge summary, MD noted: multiple myeloma on chemotherapy Ninlaro.  The patient's son has a prescription for Ninlaro and Decadron which he brings it in and administers per Dr. Mertha Finders recommendation. ?Family to call Dr. Mertha Finders office on Monday and decide when in the next dose should be.  She was supposed to get a dose this week however it was on hold due to surgery per Dr. Mertha Finders recommendation. [facility] inpatient rehab MD/PA to follow up with Dr. Tracey Harries on Monday 04/03/22  ? ? ?Clinically significant medication issues were  identified that warrant physician communication and completion of prescribed/recommended actions by midnight of the next day:  No ? Dr. Dagoberto Ligas noted 04/01/22:  HTN- actually been having some orthostatic hypotension- will defer to renal to help.  Follow up with MD on 04/02/22.  ? ?Discharge summary from [facility] on 04/01/22 indicates:   ?- history of essential hypertension continue Norvasc ?-hypothyroidism on Synthroid  (150 mcg daily) ?erlipidemia on statin ( atorvastatin) ?-anxiety and depression on duloxetine ? ?Follow up with MD on 04/02/22 for restart of synthroid, norvasc, atorvastatin, duloxetine  ? ? ?Provider Method of Notification:  ? ? ? ?Pharmacist comments:   Pharmacist will follow up with MD on 04/02/22. ? ?Time spent performing this drug regimen review (minutes):  60 minutes ? ?Nicole Cella, RPh ?Clinical Pharmacist  ?04/02/2022 10:04 PM ? ? ?ADDENDUM:   ? Clinical pharmacist,  Vance Peper, Pharm D discussed medication issues above with Dr. Dagoberto Ligas this morning 04/02/22.    ?- history of essential hypertension continue Norvasc 2.5 mg daily for Hypertension) , Levothyroxine 150 mcg daily for hypothyroidism, atorvastatin 40mg  daily for HLD and  Duloxetine 60 mg daily for depression and anxiety were resumed 04/02/22 AM. ? ?Nicole Cella, RPh ?Clinical Pharmacist ?04/02/2022 ?10:15 PM ? ? ?

## 2022-04-02 NOTE — Evaluation (Signed)
Physical Therapy Assessment and Plan ? ?Patient Details  ?Name: Christina Mercado ?MRN: [id number] ?Date of Birth: May 18, 1936 ? ?PT Diagnosis: Abnormal posture, Abnormality of gait, Cognitive deficits, Difficulty walking, Impaired cognition, Muscle weakness, and Pain in L elbow ?Rehab Potential: Fair ?ELOS: 2 weeks  ? ?Today's Date: 04/02/2022 ?PT Individual Time: 6812-7517 ?PT Individual Time Calculation (min): 55 min   ? ?Hospital Problem: Principal Problem: ?  Left humeral fracture ? ? ?Past Medical History:  ?Past Medical History:  ?Diagnosis Date  ? Arthritis   ? Diabetes mellitus without complication ([place])   ? Hypertension   ? Kidney failure   ? stage 3  ? Proteinuria   ? ?Past Surgical History:  ?Past Surgical History:  ?Procedure Laterality Date  ? DIALYSIS/PERMA CATHETER INSERTION N/A 05/30/2021  ? Procedure: DIALYSIS/PERMA CATHETER INSERTION;  Surgeon: Algernon Huxley, MD;  Location: [facility];  Service: Cardiovascular;  Laterality: N/A;  ? DIALYSIS/PERMA CATHETER INSERTION N/A 03/28/2022  ? Procedure: DIALYSIS/PERMA CATHETER INSERTION;  Surgeon: Evaristo Bury, MD;  Location: [facility];  Service: Cardiovascular;  Laterality: N/A;  ? DIALYSIS/PERMA CATHETER REMOVAL N/A 08/02/2021  ? Procedure: DIALYSIS/PERMA CATHETER REMOVAL;  Surgeon: Katha Cabal, MD;  Location: [facility];  Service: Cardiovascular;  Laterality: N/A;  ? EYE SURGERY    ? ORIF ELBOW FRACTURE Left 03/29/2022  ? Procedure: OPEN REDUCTION INTERNAL FIXATION (ORIF) LEFT ELBOW/OLECRANON FRACTURE;  Surgeon: Shona Needles, MD;  Location: [facility];  Service: Orthopedics;  Laterality: Left;  ? RENAL BIOPSY    ? TOTAL HIP ARTHROPLASTY Right   ? ? ?Assessment & Plan ?Clinical Impression: Patient is a 86 y.o. 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 She usually dialyzes Tues and Thur with [facility] [place] [facility]. Unfortunately she tripped and fell sustaining a  supracondylar distal humerus fracture s/p ORIF on 4/19. Patient transferred to CIR on 04/01/2022 .  ? ?Patient currently requires mod with mobility secondary to muscle weakness and muscle joint tightness, decreased cardiorespiratoy endurance, decreased problem solving, decreased safety awareness, and decreased memory, and decreased sitting balance, decreased standing balance, decreased postural control, and decreased balance strategies.  Prior to hospitalization, patient was modified independent  with mobility and lived with Alone in a Independent living facility home.  Home access is  Level entry. ? ?Patient will benefit from skilled PT intervention to maximize safe functional mobility, minimize fall risk, and decrease caregiver burden for planned discharge home with 24 hour supervision.  Anticipate patient will benefit from follow up [place] at discharge. ? ?PT - End of Session ?Activity Tolerance: Tolerates < 10 min activity, no significant change in vital signs ?Endurance Deficit: Yes ?Endurance Deficit Description: Seated rest breaks required b/w functional mobility tasks ?PT Assessment ?Rehab Potential (ACUTE/IP ONLY): Fair ?PT Barriers to Discharge: Decreased caregiver support;Weight bearing restrictions;Insurance for SNF coverage;Lack of/limited family support ?PT Patient demonstrates impairments in the following area(s): Balance;Endurance;Safety;Edema;Pain;Skin Integrity ?PT Transfers Functional Problem(s): Bed Mobility;Bed to Chair;Car ?PT Locomotion Functional Problem(s): Ambulation;Stairs;Wheelchair Mobility ?PT Plan ?PT Intensity: Minimum of 1-2 x/day ,45 to 90 minutes ?PT Frequency: 5 out of 7 days ?PT Duration Estimated Length of Stay: 2 weeks ?PT Treatment/Interventions: Ambulation/gait training;Cognitive remediation/compensation;Discharge planning;DME/adaptive equipment instruction;Functional mobility training;Pain management;Psychosocial support;Splinting/orthotics;UE/LE Strength taining/ROM;Therapeutic  Activities;Visual/perceptual remediation/compensation;Wheelchair propulsion/positioning;UE/LE Coordination activities;Therapeutic Exercise;Stair training;Patient/family education;Skin care/wound management;Neuromuscular re-education;Functional electrical stimulation;Disease management/prevention;Community reintegration;Balance/vestibular training ?PT Transfers Anticipated Outcome(s): supervision with LRAD ?PT Locomotion Anticipated Outcome(s): supervision with LRAD ?PT Recommendation ?Recommendations for Other Services: Therapeutic Recreation consult ?  Therapeutic Recreation Interventions: Pet therapy ?Follow Up Recommendations: 24 hour supervision/assistance ?Patient destination: Home ?Equipment Recommended: To be determined ? ? ?PT Evaluation ?Precautions/Restrictions ?Precautions ?Precautions: Fall ?Precaution Comments: LUE NWB ?Restrictions ?Weight Bearing Restrictions: Yes ?LUE Weight Bearing: Non weight bearing ?General ?  Vital SignsTherapy Vitals ?Temp: 98.5 ?F (36.9 ?C) ?Pulse Rate: 83 ?Resp: 17 ?BP: 135/62 ?Patient Position (if appropriate): Sitting ?Oxygen Therapy ?SpO2: 99 % ?O2 Device: Room Air ?Pain ?Pain Assessment ?Pain Scale: 0-10 ?Pain Score: 5  ?Pain Interference ?Pain Interference ?Pain Effect on Sleep: 2. Occasionally ?Pain Interference with Therapy Activities: 2. Occasionally ?Pain Interference with Day-to-Day Activities: 2. Occasionally ?Home Living/Prior Functioning ?Home Living ?Available Help at Discharge: Family ?Type of Home: Independent living facility ?Home Access: Level entry ?Home Layout: One level ?Bathroom Shower/Tub: Walk-in shower ?Bathroom Toilet: Handicapped height ?Bathroom Accessibility: Yes ?Additional Comments: Ambulates short household distances mod I with rollator. Facility provides meals and her son assists with transportation ? Lives With: Alone ?Prior Function ?Level of Independence: Independent with basic ADLs;Independent with gait;Independent with homemaking with  ambulation;Requires assistive device for independence;Independent with transfers ? Able to Take Stairs?: No ?Driving: No ?Vision/Perception  ?Vision - History ?Ability to See in Adequate Light: 0 Adequate ?Perception ?Perception: Within Functional Limits ?Praxis ?Praxis: Intact  ?Cognition ?Overall Cognitive Status: No family/caregiver present to determine baseline cognitive functioning ?Arousal/Alertness: Awake/alert ?Orientation Level: Oriented X4 ?Memory: Impaired ?Memory Impairment: Storage deficit;Retrieval deficit;Decreased short term memory ?Awareness: Impaired ?Awareness Impairment: Emergent impairment;Anticipatory impairment ?Problem Solving: Impaired ?Problem Solving Impairment: Verbal complex;Functional complex ?Safety/Judgment: Impaired ?Sensation ?Sensation ?Light Touch: Appears Intact ?Hot/Cold: Appears Intact ?Proprioception: Appears Intact ?Stereognosis: Appears Intact ?Coordination ?Gross Motor Movements are Fluid and Coordinated: No ?Fine Motor Movements are Fluid and Coordinated: No ?Coordination and Movement Description: limited coordination 2/2 LUE NWB and ACE wrap, edema in L digits ?Finger Nose Finger Test: unable to complete 2/2 LUE in ace wrap and NWB/painful ?Heel Shin Test: [facility] ?Motor  ?Motor ?Motor: Abnormal postural alignment and control;Other (comment) ?Motor - Skilled Clinical Observations: General deconditioning and weakness, limited 2/2 LUE NWB status  ? ?Trunk/Postural Assessment  ?Cervical Assessment ?Cervical Assessment: Exceptions to [facility] (forward head) ?Thoracic Assessment ?Thoracic Assessment: Exceptions to [facility] (rounded shoulders) ?Lumbar Assessment ?Lumbar Assessment: Exceptions to [facility] (posterior pelvic tilt) ?Postural Control ?Postural Control: Deficits on evaluation  ?Balance ?Balance ?Balance Assessed: Yes ?Standardized Balance Assessment ?Standardized Balance Assessment: Berg Balance Test ?Merrilee Jansky Balance Test ?Sit to Stand: Needs moderate or maximal assist to stand ?Standing  Unsupported: Unable to stand 30 seconds unassisted ?Sitting with Back Unsupported but Feet Supported on Floor or Stool: Able to sit 2 minutes under supervision ?Stand to Sit: Needs assistance to sit ?Trans

## 2022-04-03 ENCOUNTER — Encounter (HOSPITAL_COMMUNITY): Payer: Self-pay | Admitting: Student

## 2022-04-03 DIAGNOSIS — S42415A Nondisplaced simple supracondylar fracture without intercondylar fracture of left humerus, initial encounter for closed fracture: Secondary | ICD-10-CM | POA: Diagnosis not present

## 2022-04-03 LAB — RENAL FUNCTION PANEL
Albumin: 2.4 g/dL — ABNORMAL LOW (ref 3.5–5.0)
Anion gap: 10 (ref 5–15)
BUN: 53 mg/dL — ABNORMAL HIGH (ref 8–23)
CO2: 25 mmol/L (ref 22–32)
Calcium: 8.9 mg/dL (ref 8.9–10.3)
Chloride: 97 mmol/L — ABNORMAL LOW (ref 98–111)
Creatinine, Ser: 5.09 mg/dL — ABNORMAL HIGH (ref 0.44–1.00)
GFR, Estimated: 8 mL/min — ABNORMAL LOW (ref >60)
Glucose, Bld: 89 mg/dL (ref 70–99)
Phosphorus: 7 mg/dL — ABNORMAL HIGH (ref 2.5–4.6)
Potassium: 5.1 mmol/L (ref 3.5–5.1)
Sodium: 132 mmol/L — ABNORMAL LOW (ref 135–145)

## 2022-04-03 LAB — GLUCOSE, CAPILLARY
Glucose-Capillary: 82 mg/dL (ref 70–99)
Glucose-Capillary: 113 mg/dL — ABNORMAL HIGH (ref 70–99)
Glucose-Capillary: 123 mg/dL — ABNORMAL HIGH (ref 70–99)
Glucose-Capillary: 96 mg/dL (ref 70–99)

## 2022-04-03 MED ORDER — CAMPHOR-MENTHOL 0.5-0.5 % EX LOTN
TOPICAL_LOTION | CUTANEOUS | Status: DC | PRN
  Filled 2022-04-03: qty 222

## 2022-04-03 MED ORDER — ASCORBIC ACID 500 MG PO TABS
250.0000 mg | ORAL_TABLET | Freq: Every day | ORAL | Status: DC
  Administered 2022-04-03 – 2022-04-10 (×8): 250 mg via ORAL
  Filled 2022-04-03 (×8): qty 1

## 2022-04-03 NOTE — Progress Notes (Signed)
Inpatient Rehabilitation Center ?Individual Statement of Services ? ?Patient Name:  Christina Mercado  ?Date:  04/03/2022 ? ?Welcome to the [facility].  Our goal is to provide you with an individualized program based on your diagnosis and situation, designed to meet your specific needs.  With this comprehensive rehabilitation program, you will be expected to participate in at least 3 hours of rehabilitation therapies Monday-Friday, with modified therapy programming on the weekends. ? ?Your rehabilitation program will include the following services:  Physical Therapy (PT), Occupational Therapy (OT), 24 hour per day rehabilitation nursing, Therapeutic Recreaction (TR), Care Coordinator, Rehabilitation Medicine, Nutrition Services, and Pharmacy Services ? ?Weekly team conferences will be held on Tuesday to discuss your progress.  Your Inpatient Rehabilitation Care Coordinator will talk with you frequently to get your input and to update you on team discussions.  Team conferences with you and your family in attendance may also be held. ? ?Expected length of stay: 10-12 days  Overall anticipated outcome: supervision with cues ? ?Depending on your progress and recovery, your program may change. Your Inpatient Rehabilitation Care Coordinator will coordinate services and will keep you informed of any changes. Your Inpatient Rehabilitation Care Coordinator's name and contact numbers are listed  below. ? ?The following services may also be recommended but are not provided by the [facility]:  ? ?Home Health Rehabiltiation Services ?Outpatient Rehabilitation Services ? ?  ?Arrangements will be made to provide these services after discharge if needed.  Arrangements include referral to agencies that provide these services. ? ?Your insurance has been verified to be:  [employer] ?Your primary doctor is:  Jodean Lima ? ?Pertinent information will be shared with your doctor and your  insurance company. ? ?Inpatient Rehabilitation Care Coordinator:  Ovidio Kin, [place] or (C) [phone] ? ?Information discussed with and copy given to patient by: [name], 04/03/2022, 10:59 AM    ?

## 2022-04-03 NOTE — Progress Notes (Signed)
They have with her ?[facility] ?Progress Note  ? ?86 y.o. 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 She usually dialyzes Tues and Thur with [facility] [place] [facility]. Unfortunately she tripped and fell sustaining a supracondylar distal humerus fracture s/p ORIF on 4/19. She actually had a RIJ TC placed by [facility] as the left upper arm access would not be accessible with the fracture + ORIF.  ? ?Editor, commissioning on [place] ?Tues Sat 3hr 15 min EDW 64kg ?Venofer 50qw  Mircera 50 q4 (last 4/11) ?Calcitriol 0.25 BIW ?H1300 bolus 600/h ? ?Assessment/ Plan:   ?ESRD - usually dialyzed Tues and Sat at [facility] on [address] last HD on Tuesday.  ?Tues/Sat schedule here ?3h40mn, 2K, 400/600; No heparin; 2-3L UF ?R IJ TDC, has AVF but not accessible during injury/sling ? ?Left elbow fracture - appreciate vascular placing a RIJ TC and will need to wait till left arm is accessible before attempting dialysis through AV[facility]. She's in a lot of pain and doubt we will be able to access for weeks. S/p ORIF left supracondylar humerus fracture on 4/19.  ?Renal osteodystrophy - phos -> up/down on renvela 1 tab qAC ?Anemia - TSAT 12%; iv iron on dialysis; not due for her ESA yet (last given 4/11).  On Mircera as outpatient. 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  [facility] PMadelin Rear? ?Subjective:   ?Seen in room ?No c/o  ? ?Objective:   ?BP 115/61 (BP Location: Right Arm)   Pulse 82   Temp 98.4 ?F (36.9 ?C) (Oral)   Resp 17   Ht '5\' 5"'$  (1.651 m)   Wt 68.4 kg   SpO2 96%   BMI 25.09 kg/m?  ? ?Intake/Output Summary (Last 24 hours) at 04/03/2022 1256 ?Last data filed at 04/03/2022 0807 ?Gross per 24 hour  ?Intake 696 ml  ?Output --  ?Net 696 ml  ? ? ?Weight change: 4.1 kg ? ?Physical Exam: ?GEN: NAD, A&Ox3, NCAT, pleasant, very hard of hearing ?HEENT: No conjunctival pallor, EOMI ?NECK: Supple, no  thyromegaly ?LUNGS: CTA B/L no rales, rhonchi or wheezing ?CV: RRR, No M/R/G ?ABD: SNDNT +BS  ?EXT: No lower extremity edema ?ACCESS: lt arm + bruit even thru bandages, RIJ TC ? ? ? ?Imaging: ?No results found. ? ?Labs: ?BMET ?Recent Labs  ?Lab 03/30/22 ?1637 03/31/22 ?0420 03/31/22 ?1637 04/01/22 ?0231 04/01/22 ?1543 04/02/22 ?0831504/23/23 ?1529 04/03/22 ?0527  ?NA 138 135 137 135 137 136 136 132*  ?K 4.4 4.0 3.8 4.3 3.7 5.4* 4.4 5.1  ?CL 100 102 98 100 97* 100 94* 97*  ?CO2 '25 22 24 22 24 26 29 25  '$ ?GLUCOSE 124* 101* 163* 94 212* 91 145* 89  ?BUN 51* 56* 65* 68* 30* 36* 46* 53*  ?CREATININE 5.92* 6.25* 6.86* 6.98* 7.47* 4.16* 4.72* 5.09*  ?CALCIUM 8.6* 8.6* 8.5* 8.6* 11.4* 9.3 9.4 8.9  ?PHOS 6.3* 7.5* 7.0* 7.2* 3.8 5.6*  --  7.0*  ? ? ?CBC ?Recent Labs  ?Lab 03/29/22 ?0176104/21/23 ?0420 04/01/22 ?0231 04/02/22 ?06073 ?WBC 9.3 8.6 9.5 7.2  ?NEUTROABS 5.7  --   --  4.3  ?HGB 10.8* 9.3* 8.5* 9.8*  ?HCT 33.2* 29.4* 27.5* 30.6*  ?MCV 96.5 97.4 96.5 97.1  ?PLT 195 165 190 190  ? ? ? ?Medications:   ? ? acetaminophen  650 mg Oral Q6H  ? amLODipine  2.5 mg Oral Daily  ? vitamin C  250  mg Oral Daily  ? atorvastatin  40 mg Oral Daily  ? Chlorhexidine Gluconate Cloth  6 each Topical Daily  ? cholecalciferol  2,000 Units Oral Daily  ? docusate sodium  100 mg Oral BID  ? DULoxetine  60 mg Oral Daily  ? heparin injection (subcutaneous)  5,000 Units Subcutaneous Q8H  ? insulin aspart  0-6 Units Subcutaneous TID AC & HS  ? levothyroxine  150 mcg Oral Q0600  ? sevelamer carbonate  800 mg Oral TID WC  ? ?Rexene Agent, MD  ?04/03/2022, 12:56 PM  ? ?

## 2022-04-03 NOTE — Progress Notes (Signed)
Occupational Therapy Session Note ? ?Patient Details  ?Name: Christina Mercado ?MRN: [id number] ?Date of Birth: May 21, 1936 ? ?Today's Date: 04/03/2022 ?OT Individual Time: 1105-1200 ?OT Individual Time Calculation (min): 55 min  ? ? ?Short Term Goals: ?Week 1:  OT Short Term Goal 1 (Week 1): Pt will perform toilet/BSC transfer with LRAD and MIN A ?OT Short Term Goal 2 (Week 1): Pt will adhere to LUE NWB during transfers/ADL with no more than MIN verbal cues ?OT Short Term Goal 3 (Week 1): Pt will perform LB dress with AE PRN and MIN A ?OT Short Term Goal 4 (Week 1): Pt will perform 2/3 toileting tasks with CGA for standing balance ? ?Skilled Therapeutic Interventions/Progress Updates:  ?Pt greeted supine in bed agreeable to OT intervention. Session focus on BADL reeducation, functional mobility, dynamic standing balance and decreasing overall caregiver burden.    ?Pt completed supine>sit with CGA with pt completing dressing from EOB, MOD A for UB dressing with button up shirt with pt needing education on compensatory method of donning LUE first, pt was able to snap button on shirt with R hand. Pt donned pants with MAX A needing assist to thread LLE and pull pants up to waist line on L side, MINA for sit>stand during dressing tasks. Pt completed stand pivot transfers throughout session with quad cane and MIN A . ?Pt able to ambulate to [facility] in room with quad cane and MINA, pt needed [place] for 3/3 toileting tasks needing most assist for clothing mgmt during sit>stand ( no urine output noted). Pt able to stand at sink with no UE support and CGA, MIN A for oral care to apply toothpaste to brush. Pt transported to shower room from w/c with total A where pt completed simulated shower transfer to walk in shower with lip. Pt unable to step over shower threshold. Discussed options for shower DME such as TTB pending pt continues to have difficulty lifting BLEs over threshold. Pt agreeable to shower DME options. Pt transported back to  room with total A where pt completed stand pivot to recliner with quad cane and MIN A.      pt left seated in recliner with alarm belt activated and all needs within reach.  ? ?Therapy Documentation ?Precautions:  ?Precautions ?Precautions: Fall ?Precaution Comments: LUE NWB ?Restrictions ?Weight Bearing Restrictions: Yes ?LUE Weight Bearing: Non weight bearing ? ?Pain: unrated pain reported in LUE, no paid meds available at this time, rest breaks provided as needed, also donned sling for pain mgmt.  ? ? ? ?Therapy/Group: Individual Therapy ? ?Precious Haws ?04/03/2022, 12:31 PM ?

## 2022-04-03 NOTE — Progress Notes (Signed)
Inpatient Rehabilitation  Patient information reviewed and entered into eRehab system by Venera Privott M. Alysen Smylie, M.A., CCC/SLP, PPS Coordinator.  Information including medical coding, functional ability and quality indicators will be reviewed and updated through discharge.    

## 2022-04-03 NOTE — Progress Notes (Addendum)
Patient ID: Christina Mercado, female   DOB: 12-16-1935, 86 y.o.   MRN: [id number] ?Met with the patient to review rehab process, team conference and plan of care. Patient notes pain controlled however itching is unbearable with po medications; asked for sarna lotion. Patient noted son takes care of medication management at home. She has had several small slips PTA; this is first major fall with injury. Noted she has a lifeline device she wears. Son transports her to/from HD. Continue to follow along to discharge to address educational needs; son is a Marine scientist, very attentive; to facilitate preparation for discharge. Patient reports hopeful to return to ILF; they provide some assistance there. Dorien Chihuahua B ? ?

## 2022-04-03 NOTE — Progress Notes (Signed)
Inpatient Rehabilitation Care Coordinator ?Assessment and Plan ?Patient Details  ?Name: Christina Mercado ?MRN: [id number] ?Date of Birth: 10-20-1936 ? ?Today's Date: 04/03/2022 ? ?Hospital Problems: Principal Problem: ?  Left humeral fracture ? ?Past Medical History:  ?Past Medical History:  ?Diagnosis Date  ? Arthritis   ? Diabetes mellitus without complication ([place])   ? Hypertension   ? Kidney failure   ? stage 3  ? Proteinuria   ? ?Past Surgical History:  ?Past Surgical History:  ?Procedure Laterality Date  ? DIALYSIS/PERMA CATHETER INSERTION N/A 05/30/2021  ? Procedure: DIALYSIS/PERMA CATHETER INSERTION;  Surgeon: Algernon Huxley, MD;  Location: [place] [facility];  Service: Cardiovascular;  Laterality: N/A;  ? DIALYSIS/PERMA CATHETER INSERTION N/A 03/28/2022  ? Procedure: DIALYSIS/PERMA CATHETER INSERTION;  Surgeon: Evaristo Bury, MD;  Location: [place] [facility];  Service: Cardiovascular;  Laterality: N/A;  ? DIALYSIS/PERMA CATHETER REMOVAL N/A 08/02/2021  ? Procedure: DIALYSIS/PERMA CATHETER REMOVAL;  Surgeon: Katha Cabal, MD;  Location: [place];  Service: Cardiovascular;  Laterality: N/A;  ? EYE SURGERY    ? ORIF ELBOW FRACTURE Left 03/29/2022  ? Procedure: OPEN REDUCTION INTERNAL FIXATION (ORIF) LEFT ELBOW/OLECRANON FRACTURE;  Surgeon: Shona Needles, MD;  Location: [facility];  Service: Orthopedics;  Laterality: Left;  ? RENAL BIOPSY    ? TOTAL HIP ARTHROPLASTY Right   ? ?Social History:  reports that she has never smoked. She has never used smokeless tobacco. She reports that she does not drink alcohol and does not use drugs. ? ?Family / Support Systems ?Marital Status: Divorced ?Patient Roles: Parent ?ChildrenCarmin Richmond [phone] ?Other Supports: Robin-daughter in-law ?Anticipated Caregiver: Gwyndolyn Saxon ?Ability/Limitations of Caregiver: Pt lives in independent living and Gwyndolyn Saxon transports to HD treatments, will not be providing 24/7 care ?Caregiver Availability: Other (Comment)  (checking on Mom and providing transport to OP-HD treatments) ?Family Dynamics: Close with son and daughter in-law who check on her and assist with transport but do not provide care. Pt's other child passed away. ? ?Social History ?Preferred language: English ?Religion: Presbyterian ?Cultural Background: No issues ?Education: HS ?Health Literacy - How often do you need to have someone help you when you read instructions, pamphlets, or other written material from your doctor or pharmacy?: Always ?Writes: Yes ?Employment Status: Retired ?Legal History/Current Legal Issues: No issues ?Guardian/Conservator: None-according to MD pt is capable of making her own decisions while here, but due to [facility] will make sure son is aware of any decisions needing to be made while here  ? ?Abuse/Neglect ?Abuse/Neglect Assessment Can Be Completed: Yes ?Physical Abuse: Denies ?Verbal Abuse: Denies ?Sexual Abuse: Denies ?Exploitation of patient/patient's resources: Denies ?Self-Neglect: Denies ? ?Patient response to: ?Social Isolation - How often do you feel lonely or isolated from those around you?: Rarely ? ?Emotional Status ?Pt's affect, behavior and adjustment status: Pt is wanting to do well and get back home, she fell and now found out she has a UTI but this has also made her have a fear of falling and this is complicating therapies today. She wants to do well and will do her best while here ?Recent Psychosocial Issues: other health issues-ESRD goes to HD T & Sat ?Psychiatric History: No history seems to be coping appropriately and adapting to her new environment. She is one who tries to take each day at a time and goes with the flow. ?Substance Abuse History: No issues ? ?Patient / Family Perceptions, Expectations & Goals ?Pt/Family understanding of illness & functional limitations: Pt and son can  explain her fractured arm and are hopeful she will do well here and be able to return to [place] where she lives. Both have spoken  with the MD and feel they have a good understanding of her treatment plan moving forward. ?Premorbid pt/family roles/activities: Mom, grandmother, retiree, resident, friend, etc ?Anticipated changes in roles/activities/participation: resume ?Pt/family expectations/goals: Pt states: " I want to go back to [facility] [place] if I can from here."  Wiliam states: ' I hope she does well there and can get mobile." ? ?Community Resources ?Community Agencies: Other (Comment) (resident of [facility]) ?Premorbid Home Care/DME Agencies: Other (Comment) (has rw and tub seat) ?Transportation available at discharge: Son transport to OP-HD ?Is the patient able to respond to transportation needs?: Yes ?In the past 12 months, has lack of transportation kept you from medical appointments or from getting medications?: No ?In the past 12 months, has lack of transportation kept you from meetings, work, or from getting things needed for daily living?: No ? ?Discharge Planning ?Living Arrangements: Other (Comment) ([place]) ?Support Systems: Children, Friends/neighbors, Social worker community ?Type of Residence: Independent Living ?Care Facility Name: [facility] ?Insurance Resources: [employer], Multimedia programmer (specify) Web designer) ?Financial Resources: Social Security ?Financial Screen Referred: No ?Living Expenses: Rent ?Money Management: Patient, Family ?Does the patient have any problems obtaining your medications?: No ?Home Management: Facility provides smoe meals and cleaning services ?Patient/Family Preliminary Plans: Return to independent apartment as long as she can be mobile and be safe alone in her apartment. If not able to return may need to look at options hired assist versus going to SNF. ?Care Coordinator Barriers to Discharge: Decreased caregiver support, Hemodialysis, Weight bearing restrictions ?Care Coordinator Anticipated Follow Up Needs: HH/OP, SNF, ALF/IL ? ?Clinical Impression ?Pleasant  somewhat HOH female who is willing to work in therapies and return home. Her son is supportive and provides transportation but can not provide 24/7 care due to he works. Will await team conference and work on safe discharge for pt. She lives in [place] and needs to be mobile to return. Touch base with son and pt tomorrow. ? ?[name] ?04/03/2022, 10:55 AM ? ?  ?

## 2022-04-03 NOTE — Progress Notes (Addendum)
Physical Therapy Session Note ? ?Patient Details  ?Name: Christina Mercado ?MRN: [id number] ?Date of Birth: 1936/10/22 ? ?Today's Date: 04/03/2022 ?PT Individual Time: 0848 - 1010, 82 min ?   ? ?Short Term Goals: ?Week 1:  PT Short Term Goal 1 (Week 1): Pt will complete bed mobility with minA ?PT Short Term Goal 2 (Week 1): Pt will complete bed<>chair transfers with minA and LRAD ?PT Short Term Goal 3 (Week 1): Pt will ambulate 57f with minA and LRAD ?PT Short Term Goal 4 (Week 1): Pt will improve BERG balance score by at least 7 points to indicate improved balance. ? ?Skilled Therapeutic Interventions/Progress Updates:  ?Pt received lying in bed, reading the newspaper.  She rated pain LUE 6/10, premedicated. Cayley, RN reported that pt had Oxy at 8:15.  I urged Nsg to encourage ice pack frequently to LUE. Pt also on IV ABX due to UTI. ? ?Pt reported that she gets up on the R side of the bed at home.  With HPB slightly raised, and use or R rail, supine> sit with min assist and extra time; PT supporting LUE.  Sit> stand with min assist and QC; step to wc to L with CGA/min assist.  Stand /step backwards with QC to seat of NuSTep. ? ?Endurance/strengthening/AROM bil LEs and RUE on NUSTep x 8 min with 1 rest break, level 4, and keeping steps/min > 40.Therapeutic exercise performed with LE to increase strength for functional mobility. Seated: diaphragmatic breathing with RUE shoulder flexion/extension. ? ?Gait training on level tile with QC, x 5' before pt stated that she needed to use toilet.  Wc>< BSC next to bed, QC, min assist.  Pt needed mod assist to pull down brief. Pt continent of tiny amount of urine and stool.  Peri care with min assist for balance, in partial standing.  ? ?Pt reported that her falls in the past few months have happened very quickly, with no warning, while walking. She wears sneakers at all times, and feels that her shoes may catch the floor since she tends to shuffle.  She reported no dizziness  during the previous falls, and has gotten PT at her facility at times which helps but then her balance declines again. ? ?At end of session, pt resting in bed with alarm set, needs at hand, and LUE elevated.  ? ?   ? ?Therapy Documentation ?Precautions:  ?Precautions ?Precautions: Fall ?Precaution Comments: LUE NWB ?Restrictions ?Weight Bearing Restrictions: Yes ?LUE Weight Bearing: Non weight bearing ? ?  ?  ? ?Therapy/Group: Individual Therapy ? ?Shaylen Nephew ?04/03/2022, 8:45 AM  ?

## 2022-04-03 NOTE — Progress Notes (Signed)
?                                                       PROGRESS NOTE ? ? ?Subjective/Complaints: ?C/o left elbow and neck pain- she has asked nursing for pain medication and received- was told it takes some time to kick in. She is interested in trying heating pad as well.  ? ?ROS: +left elbow and neck pain ? ?Objective: ?  ?No results found. ?Recent Labs  ?  04/01/22 ?0231 04/02/22 ?3976  ?WBC 9.5 7.2  ?HGB 8.5* 9.8*  ?HCT 27.5* 30.6*  ?PLT 190 190  ? ?Recent Labs  ?  04/02/22 ?1529 04/03/22 ?0527  ?NA 136 132*  ?K 4.4 5.1  ?CL 94* 97*  ?CO2 29 25  ?GLUCOSE 145* 89  ?BUN 46* 53*  ?CREATININE 4.72* 5.09*  ?CALCIUM 9.4 8.9  ? ? ?Intake/Output Summary (Last 24 hours) at 04/03/2022 0952 ?Last data filed at 04/03/2022 0807 ?Gross per 24 hour  ?Intake 696 ml  ?Output --  ?Net 696 ml  ?  ? ?  ? ?Physical Exam: ?Vital Signs ?Blood pressure 115/61, pulse 82, temperature 98.4 ?F (36.9 ?C), temperature source Oral, resp. rate 17, height '5\' 5"'$  (1.651 m), weight 68.4 kg, SpO2 96 %. ? ?Gen: no distress, normal appearing ?HEENT: oral mucosa pink and moist, NCAT ?Cardio: Reg rate ?Pulmonary:  ?   Comments: CTA B/L- slightly decreased at bases B/L  ?Abdominal:  ?   General: Abdomen is flat. There is no distension.  ?   Palpations: Abdomen is soft.  ?   Tenderness: There is no abdominal tenderness.  ?   Comments: Somewhat hypoactive  ?Musculoskeletal:  ?   Cervical back: Neck supple. No tenderness.  ?   Comments: UE strength on RUE 5-/5 ?LUE- grip 5-/5 otherwise not tested ?Can lift against gravity ?LE's- 5-5 throughout B/L   ?Skin: ?   General: Skin is warm and dry.  ?   Comments: HD catheter ?L hand mild- moderate swelling- ?IV R forearm ?Incision covered with ACE wrap ?  ?Neurological:  ?   Comments: Intact to light touch in all 4 extremties  ?Psychiatric:  ?   Comments: Slightly flat- but interactive  ? ?Assessment/Plan: ?1. Functional deficits which require 3+ hours per day of interdisciplinary therapy in a comprehensive  inpatient rehab setting. ?Physiatrist is providing close team supervision and 24 hour management of active medical problems listed below. ?Physiatrist and rehab team continue to assess barriers to discharge/monitor patient progress toward functional and medical goals ? ?Care Tool: ? ?Bathing ?   ?Body parts bathed by patient: Left arm, Chest, Abdomen, Front perineal area, Buttocks, Right upper leg, Left upper leg, Face  ? Body parts bathed by helper: Left lower leg, Right lower leg, Right arm, Left arm ?  ?  ?Bathing assist Assist Level: Maximal Assistance - Patient 24 - 49% ?  ?  ?Upper Body Dressing/Undressing ?Upper body dressing   ?What is the patient wearing?: Boyd only ?   ?Upper body assist Assist Level: Maximal Assistance - Patient 25 - 49% ?   ?Lower Body Dressing/Undressing ?Lower body dressing ? ? ?   ?What is the patient wearing?: Pants, Incontinence brief ? ?  ? ?Lower body assist Assist for lower body dressing: Maximal Assistance - Patient 25 - 49% ?   ? ?  Toileting ?Toileting    ?Toileting assist Assist for toileting: Maximal Assistance - Patient 25 - 49% ?  ?  ?Transfers ?Chair/bed transfer ? ?Transfers assist ?   ? ?Chair/bed transfer assist level: Moderate Assistance - Patient 50 - 74% ?  ?  ?Locomotion ?Ambulation ? ? ?Ambulation assist ? ?   ? ?Assist level: Minimal Assistance - Patient > 75% ?Assistive device: Cane-quad ?Max distance: 10f  ? ?Walk 10 feet activity ? ? ?Assist ?   ? ?Assist level: Minimal Assistance - Patient > 75% ?Assistive device: Cane-quad  ? ?Walk 50 feet activity ? ? ?Assist Walk 50 feet with 2 turns activity did not occur: Safety/medical concerns ? ?  ?   ? ? ?Walk 150 feet activity ? ? ?Assist Walk 150 feet activity did not occur: Safety/medical concerns ? ?  ?  ?  ? ?Walk 10 feet on uneven surface  ?activity ? ? ?Assist Walk 10 feet on uneven surfaces activity did not occur: Safety/medical concerns ? ? ?  ?   ? ?Wheelchair ? ? ? ? ?Assist Is the patient using a  wheelchair?: Yes ?Type of Wheelchair: Manual ?  ? ?Wheelchair assist level: Maximal Assistance - Patient 25 - 49% ?Max wheelchair distance: 1531f ? ? ?Wheelchair 50 feet with 2 turns activity ? ? ? ?Assist ? ?  ?  ? ? ?Assist Level: Maximal Assistance - Patient 25 - 49%  ? ?Wheelchair 150 feet activity  ? ? ? ?Assist ?   ? ? ?Assist Level: Maximal Assistance - Patient 25 - 49%  ? ?Blood pressure 115/61, pulse 82, temperature 98.4 ?F (36.9 ?C), temperature source Oral, resp. rate 17, height '5\' 5"'$  (1.651 m), weight 68.4 kg, SpO2 96 %. ? ? ? Medical Problem List and Plan: ?1. Functional deficits secondary to  L humeral fracture NWB due to ground level fall, s/p ORIF on 4/19 by Dr. HaDoreatha Martin ?            -patient may not shower- HD catheter ?            -ELOS/Goals: 12-14 days- mod I ? Initial CIR evaluations today.  ?2.  Antithrombotics: ?-DVT/anticoagulation:  Pharmaceutical: Heparin ?            -antiplatelet therapy: n/a ?3. Postoperative pain from left humerus fracture: con't tylenol and Oxycodone prn. Add vitamin C to help with pain sensitivity ?4. Mood: continue social support and encouragement-  ?            -antipsychotic agents: n/a ?5. Neuropsych: This patient is capable of making decisions on her own behalf, however has STM deficits. ?6. Skin/Wound Care: local wound care for incision and HD catheter care and CHG baths ?7. Fluids/Electrolytes/Nutrition: on HD ?8. ESRD- On HD T/S- per renal ?9. Hard of hearing ?10. DM- on SSI- no oral agents due to DM ?11. HTN- actually been having some orthostatic hypotension- will defer to renal to help - got IV bolus today.  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 [facility] ?13. Tachycardia- HR running 100-s to 110s at rest- likely due to decreased fluid levels- IV bolus given this AM- labs in AM ?14. UTI- on Rocephin IV- ?15. Constipation- LBM 3-4 days ago- will give Sorbitol 15 cc and repeat if needed.  ?16. L humeral fx- NWB on LUE. ?17.  Insomnia- on melatonin-  ?18. Nausea/vomiting- has Zofran and Reglan prn- will monitor- - might be due to Constipation- see if  improves with BM ?19. Neck pain: add kpad.  ?20. Overweight: BMI 25.09: provide dietary education ? ? ?LOS: ?2 days ?A FACE TO FACE EVALUATION WAS PERFORMED ? ?Martha Clan P Teigen Parslow ?04/03/2022, 9:52 AM  ? ?  ?

## 2022-04-03 NOTE — Progress Notes (Signed)
Physical Therapy Session Note ? ?Patient Details  ?Name: Christina Mercado ?MRN: [id number] ?Date of Birth: June 28, 1936 ? ?Today's Date: 04/03/2022 ?PT Individual Time: 2479-9800 ?PT Individual Time Calculation (min): 58 min  ? ?Short Term Goals: ?Week 1:  PT Short Term Goal 1 (Week 1): Pt will complete bed mobility with minA ?PT Short Term Goal 2 (Week 1): Pt will complete bed<>chair transfers with minA and LRAD ?PT Short Term Goal 3 (Week 1): Pt will ambulate 64f with minA and LRAD ?PT Short Term Goal 4 (Week 1): Pt will improve BERG balance score by at least 7 points to indicate improved balance. ? ?Skilled Therapeutic Interventions/Progress Updates:  ?   ?Pt received supine in bed and agreeable to PT tx - reports 7/10 L arm pain and 6/10 R neck pain. RN notified who provided pain medication. PT also provided ice pack for neck during session. Pt requesting assistance to use bathroom. Supine<>sitting EOB with modA for trunk support, slow to move due to pain. Sit<>stand requiring modA with no AD and then ambulated with CGA/minA and QC from EOB to her toilet in her room, ~117f Pt requiring totalA for LB dressing in standing as she's fearful to let go of QC to assist. Pt requiring minA for controlled [facility] to [facility]) and pt was continent of bladder . She was able to wipe herself in standing with minA for balance and again required totalA for LB dressing. Ambulated ~1042fith CGA and QC back to her w/c with VC for cane sequencing, relaxing her L arm (holds it in high guard position). [facility] ([facility]!) and to work on seated there-ex LAQ, hip marches BLE 1x15 with instruction on full ROM and effort. Pt greatly appreciated the sunshine and reported improvement in her mood and less pain in her elbow and neck. Returned upstairs to [employer] remainder of session. Ambulatory transfer with CGA and QC to mat table. Worked on sit<>stand 2x5 from mat table able to  complete with supervision (!) to QC. We then worked on short distances gaiArmed forces logistics/support/administrative officerd CGA/minA for balance as pt is quite unsteady and very fearful of falling - ambulating max 44f40f end of session due to fatigue. Primary gait deficits are step-to pattern, unequal weight shift bilaterally, forward flexed trunk, limited trunk/pelvic rotation, and decreased hip/knee flexion in swing bilaterally. Gait distance limited by fatigue and fear of falling. Transported back to her room in w/c and pt remained seated in w/c with safety belt alarm on, LUE supported with pillows, call bell in lap, all needs met. ? ?Therapy Documentation ?Precautions:  ?Precautions ?Precautions: Fall ?Precaution Comments: LUE NWB ?Restrictions ?Weight Bearing Restrictions: Yes ?LUE Weight Bearing: Non weight bearing ?General: ?  ? ?Therapy/Group: Individual Therapy ? ?Lanise Mergen P Travonte Byard ?04/03/2022, 7:28 AM  ?

## 2022-04-03 NOTE — Plan of Care (Signed)
?  Problem: Consults ?Goal: RH GENERAL PATIENT EDUCATION ?Description: See Patient Education module for education specifics. ?Outcome: Progressing ?Goal: Diabetes Guidelines if Diabetic/Glucose > 140 ?Description: If diabetic or lab glucose is > 140 mg/dl - Initiate Diabetes/Hyperglycemia Guidelines & Document Interventions  ?Outcome: Progressing ?  ?Problem: RH BOWEL ELIMINATION ?Goal: RH STG MANAGE BOWEL WITH ASSISTANCE ?Description: STG Manage Bowel with mod I Assistance. ?Outcome: Progressing ?Goal: RH STG MANAGE BOWEL W/MEDICATION W/ASSISTANCE ?Description: STG Manage Bowel with Medication with mod I Assistance. ?Outcome: Progressing ?  ?Problem: RH BLADDER ELIMINATION ?Goal: RH STG MANAGE BLADDER WITH ASSISTANCE ?Description: STG Manage Bladder With mod I Assistance ?Outcome: Progressing ?  ?Problem: RH SKIN INTEGRITY ?Goal: RH STG MAINTAIN SKIN INTEGRITY WITH ASSISTANCE ?Description: STG Maintain Skin Integrity With mod I Assistance. ?Outcome: Progressing ?  ?Problem: RH SAFETY ?Goal: RH STG ADHERE TO SAFETY PRECAUTIONS W/ASSISTANCE/DEVICE ?Description: STG Adhere to Safety Precautions With cues and reminders.  ?Outcome: Progressing ?Goal: RH STG DECREASED RISK OF FALL WITH ASSISTANCE ?Description: STG Decreased Risk of Fall With cues and reminders.  ?Outcome: Progressing ?  ?Problem: RH PAIN MANAGEMENT ?Goal: RH STG PAIN MANAGED AT OR BELOW PT'S PAIN GOAL ?Description: Pain level at or below 4 on scale of 0-10 ?Outcome: Progressing ?  ?Problem: RH KNOWLEDGE DEFICIT GENERAL ?Goal: RH STG INCREASE KNOWLEDGE OF SELF CARE AFTER HOSPITALIZATION ?Description: Pt will be able to demonstrate understanding of medication management and dietary restrictions with mod I assist.  ?Outcome: Progressing ?  ?

## 2022-04-04 DIAGNOSIS — S42415A Nondisplaced simple supracondylar fracture without intercondylar fracture of left humerus, initial encounter for closed fracture: Secondary | ICD-10-CM | POA: Diagnosis not present

## 2022-04-04 LAB — RENAL FUNCTION PANEL
Albumin: 2.7 g/dL — ABNORMAL LOW (ref 3.5–5.0)
Anion gap: 11 (ref 5–15)
BUN: 64 mg/dL — ABNORMAL HIGH (ref 8–23)
CO2: 25 mmol/L (ref 22–32)
Calcium: 9.1 mg/dL (ref 8.9–10.3)
Chloride: 97 mmol/L — ABNORMAL LOW (ref 98–111)
Creatinine, Ser: 6.06 mg/dL — ABNORMAL HIGH (ref 0.44–1.00)
GFR, Estimated: 6 mL/min — ABNORMAL LOW
Glucose, Bld: 110 mg/dL — ABNORMAL HIGH (ref 70–99)
Phosphorus: 7.6 mg/dL — ABNORMAL HIGH (ref 2.5–4.6)
Potassium: 5.3 mmol/L — ABNORMAL HIGH (ref 3.5–5.1)
Sodium: 133 mmol/L — ABNORMAL LOW (ref 135–145)

## 2022-04-04 LAB — GLUCOSE, CAPILLARY
Glucose-Capillary: 108 mg/dL — ABNORMAL HIGH (ref 70–99)
Glucose-Capillary: 121 mg/dL — ABNORMAL HIGH (ref 70–99)
Glucose-Capillary: 89 mg/dL (ref 70–99)
Glucose-Capillary: 126 mg/dL — ABNORMAL HIGH (ref 70–99)

## 2022-04-04 MED ORDER — CALCIUM CARBONATE ANTACID 500 MG PO CHEW
400.0000 mg | CHEWABLE_TABLET | ORAL | Status: DC | PRN
  Filled 2022-04-04: qty 2

## 2022-04-04 MED ORDER — POLYETHYLENE GLYCOL 3350 17 G PO PACK
17.0000 g | PACK | Freq: Every day | ORAL | Status: DC
  Administered 2022-04-04 – 2022-04-08 (×5): 17 g via ORAL
  Filled 2022-04-04 (×5): qty 1

## 2022-04-04 MED ORDER — SIMETHICONE 80 MG PO CHEW: 80.0000 mg | CHEWABLE_TABLET | Freq: Four times a day (QID) | ORAL | Status: DC | PRN

## 2022-04-04 MED ORDER — CHLORHEXIDINE GLUCONATE CLOTH 2 % EX PADS
6.0000 | MEDICATED_PAD | Freq: Two times a day (BID) | CUTANEOUS | Status: DC
  Administered 2022-04-04 – 2022-04-10 (×12): 6 via TOPICAL

## 2022-04-04 MED ORDER — OXYCODONE HCL 5 MG PO TABS
10.0000 mg | ORAL_TABLET | ORAL | Status: DC | PRN
  Administered 2022-04-04 – 2022-04-09 (×14): 10 mg via ORAL
  Filled 2022-04-04 (×16): qty 2

## 2022-04-04 MED ORDER — SORBITOL 70 % SOLN
30.0000 mL | Freq: Once | Status: AC
Start: 2022-04-04 — End: 2022-04-04
  Administered 2022-04-04: 30 mL via ORAL
  Filled 2022-04-04: qty 30

## 2022-04-04 MED ORDER — SORBITOL 70 % SOLN: 30.0000 mL | Freq: Once | Status: DC

## 2022-04-04 MED ORDER — DOCUSATE SODIUM 100 MG PO CAPS
100.0000 mg | ORAL_CAPSULE | Freq: Two times a day (BID) | ORAL | Status: DC
  Administered 2022-04-04 – 2022-04-10 (×13): 100 mg via ORAL
  Filled 2022-04-04 (×12): qty 1

## 2022-04-04 NOTE — Progress Notes (Signed)
Occupational Therapy Session Note ? ?Patient Details  ?Name: Codie Hainer ?MRN: [id number] ?Date of Birth: 20-Dec-1935 ? ?Today's Date: 04/04/2022 ?OT Individual Time: 5597-4163 ?OT Individual Time Calculation (min): 75 min  ? ? ?Short Term Goals: ?Week 1:  OT Short Term Goal 1 (Week 1): Pt will perform toilet/BSC transfer with LRAD and MIN A ?OT Short Term Goal 2 (Week 1): Pt will adhere to LUE NWB during transfers/ADL with no more than MIN verbal cues ?OT Short Term Goal 3 (Week 1): Pt will perform LB dress with AE PRN and MIN A ?OT Short Term Goal 4 (Week 1): Pt will perform 2/3 toileting tasks with CGA for standing balance ? ?Skilled Therapeutic Interventions/Progress Updates:  ?  Pt received in recliner and kept saying how itchy she was and how much her arm hurt. Pt very HOH and needs slow short sentences to hear.  Pt needed significant cues and repetition to follow steps for a safe sit to stand.  Pt was then able to stand up with min A and with min used quad cane to walk slowly to wc at sink.  ?Pt engaged in b/d at sink with slow movements and extra time as any passive movement of LUE quite painful. Pt also has significant scoliosis. ? Pt needs mod A overall with UB self care and max to total LB self care as she is having great difficulty balancing without being able to use her L arm and she is having difficulty doing tasks with 1 arm.  ? ?Pt adjusted in wc with all needs met. Belt alarm on.   ? ?Therapy Documentation ?Precautions:  ?Precautions ?Precautions: Fall ?Precaution Comments: LUE NWB ?Restrictions ?Weight Bearing Restrictions: Yes ?LUE Weight Bearing: Non weight bearing ? ?  ?Vital Signs: ?Therapy Vitals ?Temp: 97.7 ?F (36.5 ?C) ?Pulse Rate: 81 ?Resp: 18 ?BP: (!) 155/69 ?Patient Position (if appropriate): Lying ?Oxygen Therapy ?SpO2: 99 % ?O2 Device: Room Air ?Pain: ?Pain Assessment ?Pain Scale: 0-10 ?Pain Score: 7  ?Pain Location: Arm ?Pain Orientation: Left ?Pain Radiating Towards: right side of  neck ?Pain Intervention(s): Medication (See eMAR) ?ADL: ?ADL ?Eating: Supervision/safety ?Grooming: Supervision/safety ?Upper Body Bathing: Moderate assistance ?Lower Body Bathing: Moderate assistance ?Upper Body Dressing: Moderate assistance ?Lower Body Dressing: Maximal assistance ?Toileting: Maximal assistance ?Toilet Transfer: Moderate assistance ? ? ?Therapy/Group: Individual Therapy ? ?[place] ?04/04/2022, 8:30 AM ?

## 2022-04-04 NOTE — Progress Notes (Signed)
They have with her ?[facility] ?Progress Note  ? ?86 y.o. 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 She usually dialyzes Tues and Thur with [facility] [place] [facility]. Unfortunately she tripped and fell sustaining a supracondylar distal humerus fracture s/p ORIF on 4/19. She actually had a RIJ TC placed by [facility] as the left upper arm access would not be accessible with the fracture + ORIF.  ? ?Editor, commissioning on [place] ?Tues Sat 3hr 15 min EDW 64kg ?Venofer 50qw  Mircera 50 q4 (last 4/11) ?Calcitriol 0.25 BIW ?H1300 bolus 600/h ? ?Assessment/ Plan:   ?ESRD - usually dialyzed Tues and Sat at [facility] on [address] last HD on Tuesday.  ?Tues/Sat schedule here ?3h80mn, 2K, 400/600; No heparin; 2-3L UF ?R IJ TDC, has AVF but not accessible during injury/sling ? ?Left elbow fracture - appreciate vascular placing a RIJ TC and will need to wait till left arm is accessible before attempting dialysis through [facility]. S/p ORIF left supracondylar humerus fracture on 4/19.  ?Renal osteodystrophy - phos -> up/down on renvela 1 tab qAC, CTM ?Anemia - TSAT 12%; iv iron on dialysis; not due for her ESA yet (last given 4/11).  On Mircera as outpatient. 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  [facility] ?Subjective:   ?Seen in room ?No c/o; working with therapy ?Positive attitude  ? ?Objective:   ?BP (!) 134/54 (BP Location: Right Arm)   Pulse 72   Temp 98.5 ?F (36.9 ?C)   Resp 17   Ht '5\' 5"'$  (1.651 m)   Wt 68.3 kg   SpO2 97%   BMI 25.06 kg/m?  ? ?Intake/Output Summary (Last 24 hours) at 04/04/2022 1306 ?Last data filed at 04/04/2022 1100 ?Gross per 24 hour  ?Intake 220 ml  ?Output --  ?Net 220 ml  ? ? ?Weight change: -0.1 kg ? ?Physical Exam: ?GEN: NAD, A&Ox3, NCAT, pleasant, very hard of hearing, in wheelchair ?HEENT: No conjunctival pallor, EOMI ?NECK: Supple, no thyromegaly ?LUNGS:  CTA B/L no rales, rhonchi or wheezing ?CV: RRR, No M/R/G ?ABD: SNDNT +BS  ?EXT: No lower extremity edema ?ACCESS: lt arm + bruit even thru bandages, RIJ TC ? ? ? ?Imaging: ?No results found. ? ?Labs: ?BMET ?Recent Labs  ?Lab 03/31/22 ?0420 03/31/22 ?1637 04/01/22 ?0231 04/01/22 ?1543 04/02/22 ?0761604/23/23 ?1529 04/03/22 ?0073704/25/23 ?01062 ?NA 135 137 135 137 136 136 132* 133*  ?K 4.0 3.8 4.3 3.7 5.4* 4.4 5.1 5.3*  ?CL 102 98 100 97* 100 94* 97* 97*  ?CO2 '22 24 22 24 26 29 25 25  '$ ?GLUCOSE 101* 163* 94 212* 91 145* 89 110*  ?BUN 56* 65* 68* 30* 36* 46* 53* 64*  ?CREATININE 6.25* 6.86* 6.98* 7.47* 4.16* 4.72* 5.09* 6.06*  ?CALCIUM 8.6* 8.5* 8.6* 11.4* 9.3 9.4 8.9 9.1  ?PHOS 7.5* 7.0* 7.2* 3.8 5.6*  --  7.0* 7.6*  ? ? ?CBC ?Recent Labs  ?Lab 03/29/22 ?0694804/21/23 ?0420 04/01/22 ?0231 04/02/22 ?05462 ?WBC 9.3 8.6 9.5 7.2  ?NEUTROABS 5.7  --   --  4.3  ?HGB 10.8* 9.3* 8.5* 9.8*  ?HCT 33.2* 29.4* 27.5* 30.6*  ?MCV 96.5 97.4 96.5 97.1  ?PLT 195 165 190 190  ? ? ? ?Medications:   ? ? acetaminophen  650 mg Oral Q6H  ? amLODipine  2.5 mg Oral Daily  ? vitamin C  250 mg Oral Daily  ? atorvastatin  40  mg Oral Daily  ? Chlorhexidine Gluconate Cloth  6 each Topical Daily  ? cholecalciferol  2,000 Units Oral Daily  ? docusate sodium  100 mg Oral BID  ? DULoxetine  60 mg Oral Daily  ? heparin injection (subcutaneous)  5,000 Units Subcutaneous Q8H  ? insulin aspart  0-6 Units Subcutaneous TID AC & HS  ? levothyroxine  150 mcg Oral Q0600  ? polyethylene glycol  17 g Oral Daily  ? sevelamer carbonate  800 mg Oral TID WC  ? ?Rexene Agent, MD  ?04/04/2022, 1:06 PM  ? ?

## 2022-04-04 NOTE — Progress Notes (Signed)
Physical Therapy Session Note ? ?Patient Details  ?Name: Christina Mercado ?MRN: [id number] ?Date of Birth: Feb 12, 1936 ? ?Today's Date: 04/04/2022 ?PT Individual Time: 1000-1055 ?PT Individual Time Calculation (min): 55 min  ? ?Short Term Goals: ?Week 1:  PT Short Term Goal 1 (Week 1): Pt will complete bed mobility with minA ?PT Short Term Goal 2 (Week 1): Pt will complete bed<>chair transfers with minA and LRAD ?PT Short Term Goal 3 (Week 1): Pt will ambulate 29f with minA and LRAD ?PT Short Term Goal 4 (Week 1): Pt will improve BERG balance score by at least 7 points to indicate improved balance. ? ?Skilled Therapeutic Interventions/Progress Updates:  ?   ?Pt received sitting in w/c and agreeable to PT tx - reports 5/10 L elbow pain - not due for pain medication so rest breaks and mobility provided for pain management. She also reports fatigue from busy morning of therapy. Treatment to tolerance .LPN disconnecting PIV during session. Transported downstairs to 45M rehab gym for time management.  ? ?Instructed in gait training where she ambulated ~332fwith CGA/minA and QC on level surfaces - gait speed decreased with step-to pattern, decreased L weight shift, crouched posture. During gait, pt expressing need to void. So transported to ADL apartment room in w/c for time and urgency and completed ambulatory transfer, ~1522fwith CGA and QC to toilet. Pt requiring totalA for LB dressing in standing due to fear of falling and not wanting to let go of QC - required minA for controlled lowering to toilet. Pt unable to void but felt the urge - she also c/o "burning" while trying to void. LPN made aware with concern for possible UTI. Sit<>Stand from toilet with minA to QC and totRock Portr LB dressing. Ambulated back to her w/c ~31f69fth CGA and QC with cues for safety, improved stride length, and postural awareness.  ? ?Completed Kinetron while seated in w/c with resistance set to 50cm/sec. Cues for full ROM and effort and guided  breathing as she tends to strain. 1min51m, 2 min rest x5.  ? ?Pt returned upstairs to her room. Remained seated in w/c with pillow provided for LUE comfort. Safety belt alarm on. Call bell in lap. All needs met. ? ?Therapy Documentation ?Precautions:  ?Precautions ?Precautions: Fall ?Precaution Comments: LUE NWB ?Restrictions ?Weight Bearing Restrictions: Yes ?LUE Weight Bearing: Non weight bearing ?General: ?  ? ?Therapy/Group: Individual Therapy ? ?Damian Hofstra P Eiden Bagot ?04/04/2022, 7:53 AM  ?

## 2022-04-04 NOTE — Progress Notes (Signed)
Physical Therapy Session Note ? ?Patient Details  ?Name: Christina Mercado ?MRN: [id number] ?Date of Birth: 1936/06/25 ? ?Today's Date: 04/04/2022 ?PT Individual Time: 1130-1200 ?PT Individual Time Calculation (min): 30 min  ? ?Short Term Goals: ?Week 1:  PT Short Term Goal 1 (Week 1): Pt will complete bed mobility with minA ?PT Short Term Goal 2 (Week 1): Pt will complete bed<>chair transfers with minA and LRAD ?PT Short Term Goal 3 (Week 1): Pt will ambulate 78f with minA and LRAD ?PT Short Term Goal 4 (Week 1): Pt will improve BERG balance score by at least 7 points to indicate improved balance. ? ?Skilled Therapeutic Interventions/Progress Updates:  ?  Pt received seated in w/c in room, agreeable to PT session. Pt reports pain in her LUE, able to receive pain medication at end of session. Utilized repositioning, rest breaks, and distraction as needed. Pt initially agreeable to go for a short walk but then falls asleep while seated in w/c while therapist is setting up session. Pt arousable but requests to return to bed due to fatigue from already having 3 therapy sessions this AM prior to this session. Sit to stand with min A to QC, stand pivot transfer w/c to bed with QC and min A, verbal cues for sequencing. Sit to supine with mod A needed for BLE management. Once supine in bed pt reports urge to urinate. Pt returns to sitting EOB with mod A needed for trunk elevation and some LE management. Sit to stand with min A to QC. Ambulatory transfer into bathroom with QC and min A for balance. Pt is dependent for clothing management, min A for toilet transfer with QC and elevated BSC over toilet. Pt unable to void, requests to return to bed. Pt returned to bed in similar manner as reported above. Pt left seated in bed with needs in reach, bed alarm in place at end of session. ? ?Therapy Documentation ?Precautions:  ?Precautions ?Precautions: Fall ?Precaution Comments: LUE NWB ?Restrictions ?Weight Bearing Restrictions:  Yes ?LUE Weight Bearing: Non weight bearing ? ? ? ? ? ? ?Therapy/Group: Individual Therapy ? ? ?TExcell Seltzer PT, DPT, CSRS ? ?04/04/2022, 12:26 PM  ?

## 2022-04-04 NOTE — Progress Notes (Addendum)
?                                                       PROGRESS NOTE ? ? ?Subjective/Complaints: ? ?Pt reports she's having L elbow pain pretty bad- asking for pain meds- doesn't know if she's due, but asked nursing to check.  ? ?Bowels- LBM 4/24, but small and hard lumps- will give sorbitol to get her to empty put- feels constipated.  ?Also c/o nausea last 2 night-s likely related to constipation.  ? ?ROS:  ?Pt denies SOB, abd pain, CP, (+) N/V/(+)C/D, and vision changes ? ? ?Objective: ?  ?No results found. ?Recent Labs  ?  04/02/22 ?7654  ?WBC 7.2  ?HGB 9.8*  ?HCT 30.6*  ?PLT 190  ? ?Recent Labs  ?  04/03/22 ?6503 04/04/22 ?5465  ?NA 132* 133*  ?K 5.1 5.3*  ?CL 97* 97*  ?CO2 25 25  ?GLUCOSE 89 110*  ?BUN 53* 64*  ?CREATININE 5.09* 6.06*  ?CALCIUM 8.9 9.1  ? ? ?Intake/Output Summary (Last 24 hours) at 04/04/2022 0919 ?Last data filed at 04/03/2022 1621 ?Gross per 24 hour  ?Intake 100 ml  ?Output --  ?Net 100 ml  ?  ? ?  ? ?Physical Exam: ?Vital Signs ?Blood pressure (!) 155/69, pulse 81, temperature 97.7 ?F (36.5 ?C), resp. rate 18, height '5\' 5"'$  (1.651 m), weight 68.3 kg, SpO2 99 %. ? ? ?General: awake, alert, appropriate, laying supine in bed; woke from sleep; NAD ?HENT: conjugate gaze; oropharynx moist ?CV: regular rate; no JVD ?Pulmonary: CTA B/L; no W/R/R- good air movement- decreased at bases B/L  ?GI: soft, NT, somewhat distended; a little firmish; hypoactive BS ?Psychiatric: appropriate ?Neurological: alert ?Musculoskeletal:  ?   Cervical back: Neck supple. No tenderness.  ?   Comments: UE strength on RUE 5-/5 ?LUE- grip 5-/5 otherwise not tested ?Can lift against gravity ?LE's- 5-5 throughout B/L   ?Skin: ?   General: Skin is warm and dry.  ?   Comments: HD catheter ?L hand mild- moderate swelling- ?IV R forearm ?Incision covered with ACE wrap ?  ?Neurological:  ?   Comments: Intact to light touch in all 4 extremties  ?Psychiatric:  ?   Comments: Slightly flat- but interactive  ? ?Assessment/Plan: ?1.  Functional deficits which require 3+ hours per day of interdisciplinary therapy in a comprehensive inpatient rehab setting. ?Physiatrist is providing close team supervision and 24 hour management of active medical problems listed below. ?Physiatrist and rehab team continue to assess barriers to discharge/monitor patient progress toward functional and medical goals ? ?Care Tool: ? ?Bathing ?   ?Body parts bathed by patient: Left arm, Chest, Abdomen, Front perineal area, Buttocks, Right upper leg, Left upper leg, Face  ? Body parts bathed by helper: Left lower leg, Right lower leg, Right arm, Left arm ?  ?  ?Bathing assist Assist Level: Maximal Assistance - Patient 24 - 49% ?  ?  ?Upper Body Dressing/Undressing ?Upper body dressing   ?What is the patient wearing?: Button up shirt ?   ?Upper body assist Assist Level: Moderate Assistance - Patient 50 - 74% ?   ?Lower Body Dressing/Undressing ?Lower body dressing ? ? ?   ?What is the patient wearing?: Pants, Incontinence brief ? ?  ? ?Lower body assist Assist for lower body dressing: Maximal Assistance -  Patient 25 - 49% ?   ? ?Toileting ?Toileting    ?Toileting assist Assist for toileting: Maximal Assistance - Patient 25 - 49% ?  ?  ?Transfers ?Chair/bed transfer ? ?Transfers assist ?   ? ?Chair/bed transfer assist level: Minimal Assistance - Patient > 75% ?  ?  ?Locomotion ?Ambulation ? ? ?Ambulation assist ? ?   ? ?Assist level: Minimal Assistance - Patient > 75% ?Assistive device: Cane-quad ?Max distance: 5  ? ?Walk 10 feet activity ? ? ?Assist ?   ? ?Assist level: Minimal Assistance - Patient > 75% ?Assistive device: Cane-quad  ? ?Walk 50 feet activity ? ? ?Assist Walk 50 feet with 2 turns activity did not occur: Safety/medical concerns ? ?  ?   ? ? ?Walk 150 feet activity ? ? ?Assist Walk 150 feet activity did not occur: Safety/medical concerns ? ?  ?  ?  ? ?Walk 10 feet on uneven surface  ?activity ? ? ?Assist Walk 10 feet on uneven surfaces activity did not  occur: Safety/medical concerns ? ? ?  ?   ? ?Wheelchair ? ? ? ? ?Assist Is the patient using a wheelchair?: Yes ?Type of Wheelchair: Manual ?  ? ?Wheelchair assist level: Maximal Assistance - Patient 25 - 49% ?Max wheelchair distance: 169f  ? ? ?Wheelchair 50 feet with 2 turns activity ? ? ? ?Assist ? ?  ?  ? ? ?Assist Level: Maximal Assistance - Patient 25 - 49%  ? ?Wheelchair 150 feet activity  ? ? ? ?Assist ?   ? ? ?Assist Level: Maximal Assistance - Patient 25 - 49%  ? ?Blood pressure (!) 155/69, pulse 81, temperature 97.7 ?F (36.5 ?C), resp. rate 18, height '5\' 5"'$  (1.651 m), weight 68.3 kg, SpO2 99 %. ? ? ? Medical Problem List and Plan: ?1. Functional deficits secondary to  L humeral fracture NWB due to ground level fall, s/p ORIF on 4/19 by Dr. HDoreatha Martin  ?            -patient may not shower- HD catheter ?            -ELOS/Goals: 12-14 days- mod I ? Continue CIR- PT, OT  ?Team conference today to determine length of stay ?2.  Antithrombotics: ?-DVT/anticoagulation:  Pharmaceutical: Heparin ?            -antiplatelet therapy: n/a ?3. Postoperative pain from left humerus fracture: con't tylenol and Oxycodone prn. Add vitamin C to help with pain sensitivity ? 4/25- will change Oxycodone to 10 mg q4 hours prn- see if that helps, but monitor constipation, since already an issue.  ?4. Mood: continue social support and encouragement-  ?            -antipsychotic agents: n/a ?5. Neuropsych: This patient is capable of making decisions on her own behalf, however has STM deficits. ?6. Skin/Wound Care: local wound care for incision and HD catheter care and CHG baths ?7. Fluids/Electrolytes/Nutrition: on HD ?8. ESRD- On HD T/S- per renal ?9. Hard of hearing ?10. DM- on SSI- no oral agents due to DM ?11. HTN- actually been having some orthostatic hypotension- will defer to renal to help - got IV bolus today.  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 [facility]  ?13. Tachycardia- HR running 100-s to  110s at rest- likely due to decreased fluid levels- IV bolus given this AM- labs in AM ?14. UTI- on Rocephin IV-needs 7 days ?15. Constipation- LBM 3-4 days ago- will  give Sorbitol 15 cc and repeat if needed.  ?16. L humeral fx- NWB on LUE. ?17. Insomnia- on melatonin-  ?18. Nausea/vomiting- has Zofran and Reglan prn- will monitor- - might be due to Constipation- see if improves with BM ? 4/25- likely constipation- working on it.  ?19. Neck pain: add kpad.  ?20. Overweight: BMI 25.09: provide dietary education ?21/. Constipation ? 4/25- will give sorbitol 30 cc at 10 am since has HD today. Also con't Colace 1 tab BID for hard balls. Also added miralax ?22. Hyperkalemia ? 4/25- K+ 5.3- per renal.  ? ? ?I spent a total of 51   minutes on total care today- >50% coordination of care- due to d/w nurse about constipation and bowels- and team conference today and [place].  ? ? ?LOS: ?3 days ?A FACE TO FACE EVALUATION WAS PERFORMED ? ?Ido Wollman ?04/04/2022, 9:19 AM  ? ?  ?

## 2022-04-04 NOTE — Progress Notes (Signed)
Patient ID: Christina Mercado, female   DOB: 01-08-1936, 86 y.o.   MRN: [id number]  Met with pt and spoke with her son-Bill via telephone to discuss team conference goals of min assist and target discharge 5/1. Since pt will require assist at discharge will need to go to a SNF and then from there back to her [place] apartment if she can reach that level. Son would like her to go to [facility] in [place]. He does transport her back and forth to HD in [place]. Will begin the SNF process.  ?

## 2022-04-04 NOTE — IPOC Note (Signed)
Overall Plan of Care (IPOC) ?Patient Details ?Name: Christina Mercado ?MRN: [id number] ?DOB: Feb 07, 1936 ? ?Admitting Diagnosis: Left humeral fracture ? ?Hospital Problems: Principal Problem: ?  Left humeral fracture ? ? ? ? Functional Problem List: ?Nursing Skin Integrity, Pain, Motor, Medication Management, Endurance, Bladder, Bowel, Safety, Perception  ?PT Balance, Endurance, Safety, Edema, Pain, Skin Integrity  ?OT Balance, Behavior, Cognition, Edema, Endurance, Motor, Pain, Perception, Safety  ?SLP    ?TR    ?    ? Basic ADL?s: ?OT Eating, Grooming, Bathing, Dressing, Toileting  ? ?  Advanced  ADL?s: ?OT Simple Meal Preparation  ?   ?Transfers: ?PT Bed Mobility, Bed to Chair, Car  ?OT Toilet, Tub/Shower  ? ?  Locomotion: ?PT Ambulation, Stairs, Wheelchair Mobility  ? ?  Additional Impairments: ?OT None  ?SLP   ?  ?   ?TR    ? ? ?Anticipated Outcomes ?Item Anticipated Outcome  ?Self Feeding Mod I  ?Swallowing ?   ?  ?Basic self-care ? Supervision  ?Toileting ? Supervision ?  ?Bathroom Transfers Supervision  ?Bowel/Bladder ? Manage bowel and bladder with mod I assist  ?Transfers ? supervision with LRAD  ?Locomotion ? supervision with LRAD  ?Communication ?    ?Cognition ?    ?Pain ? pain level at or below 4 on a scale of 0-10  ?Safety/Judgment ? Remain free of injury, prevent falls with cues and reminders  ? ?Therapy Plan: ?PT Intensity: Minimum of 1-2 x/day ,45 to 90 minutes ?PT Frequency: 5 out of 7 days ?PT Duration Estimated Length of Stay: 2 weeks ?OT Intensity: Minimum of 1-2 x/day, 45 to 90 minutes ?OT Frequency: 5 out of 7 days ?OT Duration/Estimated Length of Stay: 10-12 days ?   ? ?Due to the current state of emergency, patients may not be receiving their 3-hours of Medicare-mandated therapy. ? ? Team Interventions: ?Nursing Interventions Pain Management, Patient/Family Education, Bladder Management, Bowel Management, Skin Care/Wound Management, Discharge Planning, Medication Management, Cognitive  Remediation/Compensation, Psychosocial Support, Disease Management/Prevention  ?PT interventions Ambulation/gait training, Cognitive remediation/compensation, Discharge planning, DME/adaptive equipment instruction, Functional mobility training, Pain management, Psychosocial support, Splinting/orthotics, UE/LE Strength taining/ROM, Therapeutic Activities, Visual/perceptual remediation/compensation, Wheelchair propulsion/positioning, UE/LE Coordination activities, Therapeutic Exercise, Stair training, Patient/family education, Skin care/wound management, Neuromuscular re-education, Functional electrical stimulation, Disease management/prevention, Academic librarian, Training and development officer  ?OT Interventions Balance/vestibular training, Disease mangement/prevention, Neuromuscular re-education, Self Care/advanced ADL retraining, Therapeutic Exercise, UE/LE Strength taining/ROM, DME/adaptive equipment instruction, Cognitive remediation/compensation, Pain management, Skin care/wound managment, UE/LE Coordination activities, Splinting/orthotics, Patient/family education, Functional electrical stimulation, Community reintegration, Discharge planning, Functional mobility training, Psychosocial support, Therapeutic Activities, Visual/perceptual remediation/compensation  ?SLP Interventions    ?TR Interventions    ?SW/CM Interventions Discharge Planning, Psychosocial Support, Patient/Family Education  ? ?Barriers to Discharge ?MD  Medical stability, Home enviroment access/loayout, Wound care, Lack of/limited family support, and Weight bearing restrictions  ?Nursing Hemodialysis, Decreased caregiver support ?1 level, level entry to [place]; ESRD;usually dialyzed Tues and Sat at [facility] on [address]  ?PT Decreased caregiver support, Weight bearing restrictions, Insurance for SNF coverage, Lack of/limited family support ?   ?OT Weight bearing restrictions ?   ?SLP   ?   ?SW Decreased caregiver  support, Hemodialysis, Weight bearing restrictions ?   ? ?Team Discharge Planning: ?Destination: PT-Home ,OT- Home (ILF) , SLP-  ?Projected Follow-up: PT-24 hour supervision/assistance, OT-  Home health OT, SLP-  ?Projected Equipment Needs: PT-To be determined, OT- To be determined, SLP-  ?Equipment Details: PT- , OT-  ?Patient/family involved in  discharge planning: PT- Patient,  OT-Patient, SLP-  ? ?MD ELOS: 10-12 days ?Medical Rehab Prognosis:  Good ?Assessment:  ?The patient has been admitted for CIR therapies with the diagnosis of L humeral fx. The team will be addressing functional mobility, strength, stamina, balance, safety, adaptive techniques and equipment, self-care, bowel and bladder mgt, patient and caregiver education, . Goals have been set at supervision. Anticipated discharge destination is home with children. ? ?Due to the current state of emergency, patients may not be receiving their 3 hours per day of Medicare-mandated therapy.  ? ? ? ? ? ?See Team Conference Notes for weekly updates to the plan of care ? ?

## 2022-04-04 NOTE — Patient Care Conference (Signed)
Inpatient RehabilitationTeam Conference and Plan of Care Update ?Date: 04/04/2022   Time: 12:12 PM  ? ? ?Patient Name: Christina Mercado      ?Medical Record Number: [id number]  ?Date of Birth: September 24, 1936 ?Sex: Female         ?Room/Bed: 5C10C/5C10C-01 ?Payor Info: Payor: MEDICARE / Plan: MEDICARE PART A AND B / Product Type: *No Product type* /   ? ?Admit Date/Time:  04/01/2022  2:41 PM ? ?Primary Diagnosis:  Left humeral fracture ? ?Hospital Problems: Principal Problem: ?  Left humeral fracture ? ? ? ?Expected Discharge Date: Expected Discharge Date: 04/10/22 (SNF) ? ?Team Members Present: ?Physician leading conference: Dr. Courtney Heys ?Nurse Present: Dorien Chihuahua, RN ?SLP Present: Weston Anna, SLP ? ?   Current Status/Progress Goal Weekly Team Focus  ?Bowel/Bladder ? ?   Continent of bladder; constipation addressed   Continent of bowel and bladder   Monitor effectiveness of medications and need for prn laxatives  ?Swallow/Nutrition/ Hydration ? ?           ?ADL's ? ? HHA transfers, mod UB self care, mod - max LB self care  supervision overall  ADL training, endurance, functional mobility, pt education   ?Mobility ? ? modA bed mobility, CGA/minA sit<>stand using QC, CGA stand<>pivot transfers using QC, gait up to 76f with CGA/minA and QC. Very fearful of falling, poor endurance and activity tolerance. and balance is VERY poor (BERG 4/56). I don't think she will be able to return to ILF  supervision  global strengthening and endurance, activity tolerance, functional transfers, gait training, pain management.   ?Communication ? ?           ?Safety/Cognition/ Behavioral Observations ?           ?Pain ? ?   Pain in left arm; general pain with movement. Treated with prns   Pain managed with prn meds   Pain meds q 4 hours and prn; assess effectiveness   ?Skin ? ?   Dressing to left arm   Dressing to left arm if needed and son able to change   Assess need for dressing to left arm, drainage etc, and teach son how to change  dressing  ? ? ?Discharge Planning:  ?[place] can check on her but not provide 24/7 care.   ?Team Discussion: ?Patient with constipation and nausea; post fall with humerus fracture.  Progress limited by pain in arm and generalized soreness. Reports previous falls at ILF PTA. Given HOH and mobility issues, recommend SNF for short term at discharge before [place]  hyper[place]emic; Nephrology to address.  ?Patient on target to meet rehab goals: ?yes, currently needs CGA - min assist overall for PT. Needs mod assit for upper body care and max assist for lower body care.  ? ?*See Care Plan and progress notes for long and short-term goals.  ? ?Revisions to Treatment Plan:  ?Dressing change  updated ?Pain medication adjusted ?ROM exercises for elbow as tol per ortho  ?Teaching Needs: ?Safety, skin care, medications, transfers, toileting, ADLs, etc  ?Current Barriers to Discharge: ?Decreased caregiver support and Home enviroment access/layout ? ?Possible Resolutions to Barriers: ?SNF recommended for discharge ?  ? ? Medical Summary ?Current Status: sarna for itching and benadryl- changed Oxycodone to Q4 hours prn from QID prn due to pain out of control- ? Barriers to Discharge: Weight;Weight bearing restrictions;Decreased family/caregiver support;Hemodialysis;Medical stability;Wound care;Home enviroment access/layout;Medication  compliance ? Barriers to Discharge Comments: lived in independent- facility- was falling a lot at home- will likely need SNF at home ?Possible Resolutions to [employer]: stands with CGA-min A- lots of cues-heavy mod A UB/LB max A; mod A for PT- will likely need SNF until WBAT?- allowed ot do Elbow ROM- - very HOH- d/c set for 5/1 to SNF ? ? ?Continued Need for Acute Rehabilitation Level of Care: The patient requires daily medical management by a physician with specialized training in physical medicine  and rehabilitation for the following reasons: ?Direction of a multidisciplinary physical rehabilitation program to maximize functional independence : Yes ?Medical management of patient stability for increased activity during participation in an intensive rehabilitation regime.: Yes ?Analysis of laboratory values and/or radiology reports with any subsequent need for medication adjustment and/or medical intervention. : Yes ? ? ?I attest that I was present, lead the team conference, and concur with the assessment and plan of the team. ? ? ?Dorien Chihuahua B ?04/04/2022, 12:40 PM  ? ? ? ? ? ? ?

## 2022-04-04 NOTE — Progress Notes (Signed)
Orders for HD not received. Called HD, pt not going for dialysis today. ?Sheela Stack, LPN  ?

## 2022-04-04 NOTE — Progress Notes (Signed)
Physical Therapy Session Note ? ?Patient Details  ?Name: Christina Mercado ?MRN: [id number] ?Date of Birth: 08-02-1936 ? ?Today's Date: 04/04/2022 ?PT Individual Time: 4034-7425 and 9563-8756 ?PT Individual Time Calculation (min): 27 min and 28 min ? ?Short Term Goals: ?Week 1:  PT Short Term Goal 1 (Week 1): Pt will complete bed mobility with minA ?PT Short Term Goal 2 (Week 1): Pt will complete bed<>chair transfers with minA and LRAD ?PT Short Term Goal 3 (Week 1): Pt will ambulate 83f with minA and LRAD ?PT Short Term Goal 4 (Week 1): Pt will improve BERG balance score by at least 7 points to indicate improved balance. ? ?Skilled Therapeutic Interventions/Progress Updates:  ?  Session 1: Pt received supported long sitting in bed, asleep with meal tray set-up in front of her. Upon awakening, pt agreeable to therapy session to transfer into recliner to finish her meal. Supine>sitting R EOB, HOB elevated and using bedrail, with light min assist for trunk upright. Pt able to scoot to EOB with increased time but no assistance. Sitting EOB donned L UE sling for comfort - pt very cautious of movement in L UE and is very sensitive to even light touch anywhere on L UE including her fingers - pt states she is "very protective" over her L UE. ? ?Pt suddenly reports urge to use bathroom. Sit>stand partially elevated EOB>R UE support on wide based quad cane ([facility] with CGA for steadying while rising. Gait training ~166fx2 in/out bathroom using R UE support on [facility] CGA/light min assist for steadying - pt maintains wide BOS with very short step lengths bilaterally achieving only partial step-through pattern with B LEs partially flexed and externally rotated - very slow gait speed with cautious movements.  ? ?Standing with RUE support on [facility] CGA for steadying during total assist LB clothing management. Pt continent of bladder and requires total assist peri-care. Sit<>stand bari-BSC<>WBQC with CGA for steadying. ? ?At end  of session, pt left seated in recliner with needs in reach, seat belt alarm on, L UE in sling and supported on pillows, and meal tray set-up. ? ? ?Session 2: Pt received supine in bed resting but agreeable to therapy session. Pt continues to frequently close eyes throughout session. Pt already wearing L UE sling. Pt continues to remain very protective over L UE and is sensitive to even light touch to that hand. Pt also repots pain in L knee stating she has "bad knees" - performed seated EOB L knee flexion/extension AROM for pain management prior to standing. Supine>sitting R EOB, HOB elevated and using bedrail, with light min assist for trunk upright. Scoots towards EOB with increased time but no assistance. Sitting EOB donned tennis shoes total assist. Sit>stand slightly elevated EOB to R UE support on wide based quad cane with CGA for steadying - pt able to power up without assist. Pt reports urge to use bathroom. Gait in/out bathroom using R UE support on wide based QC with CGA for steadying - maintains wide BOS with decreased L LE step length starting with step-to but progresses to slight step-through. Standing with CGA for steadying during total assist LB clothing management due to urgency. Pt unable to void despite having felt the urge. Gait training back to recliner as just described. Pt left seated in recliner with needs in reach, seat belt alarm on, L UE in sling and supported on pillows, with meal tray set-up.  ? ?Of note: Pt tends to repeat herself and have some impaired  short term recall of what she had just stated - did not notice this as prominent this AM but pt also more fatigued this afternoon keeping her eyes shut more frequently. ? ?Therapy Documentation ?Precautions:  ?Precautions ?Precautions: Fall ?Precaution Comments: LUE NWB ?Restrictions ?Weight Bearing Restrictions: Yes ?LUE Weight Bearing: Non weight bearing ? ? ?Pain: ?Session 1: Reports L UE pain with sensitivity to movements and light  touch throughout session but manageable with use of sling for support. ? ?Session 2: Repeatedly states pain in L shoulder throughout session - wearing sling for comfort and pt remains very protective over light touch of L UE. Premedicated. Provided repositioning and distraction for pain management. ? ? ? ? ?Therapy/Group: Individual Therapy ? ?Tawana Scale , PT, DPT, NCS, CSRS ?04/04/2022, 7:53 AM  ?

## 2022-04-05 LAB — GLUCOSE, CAPILLARY
Glucose-Capillary: 88 mg/dL (ref 70–99)
Glucose-Capillary: 91 mg/dL (ref 70–99)
Glucose-Capillary: 108 mg/dL — ABNORMAL HIGH (ref 70–99)
Glucose-Capillary: 94 mg/dL (ref 70–99)

## 2022-04-05 LAB — RENAL FUNCTION PANEL
Albumin: 2.5 g/dL — ABNORMAL LOW (ref 3.5–5.0)
Anion gap: 11 (ref 5–15)
BUN: 70 mg/dL — ABNORMAL HIGH (ref 8–23)
CO2: 22 mmol/L (ref 22–32)
Calcium: 8.9 mg/dL (ref 8.9–10.3)
Chloride: 98 mmol/L (ref 98–111)
Creatinine, Ser: 6.37 mg/dL — ABNORMAL HIGH (ref 0.44–1.00)
GFR, Estimated: 6 mL/min — ABNORMAL LOW (ref >60)
Glucose, Bld: 86 mg/dL (ref 70–99)
Phosphorus: 7.7 mg/dL — ABNORMAL HIGH (ref 2.5–4.6)
Potassium: 5.7 mmol/L — ABNORMAL HIGH (ref 3.5–5.1)
Sodium: 131 mmol/L — ABNORMAL LOW (ref 135–145)

## 2022-04-05 MED ORDER — SORBITOL 70 % SOLN
45.0000 mL | Freq: Once | Status: AC
  Administered 2022-04-05: 45 mL via ORAL
  Filled 2022-04-05: qty 60

## 2022-04-05 MED ORDER — HEPARIN SODIUM (PORCINE) 1000 UNIT/ML IJ SOLN
INTRAMUSCULAR | Status: AC
  Administered 2022-04-05: 1000 [IU]
  Filled 2022-04-05: qty 4

## 2022-04-05 NOTE — [place] FL2 (Signed)
?[place] MEDICAID FL2 LEVEL OF CARE SCREENING TOOL  ?  ? ?IDENTIFICATION  ?Patient Name: ?Christina Mercado Birthdate: 10-30-1936 Sex: female Admission Date (Current Location): ?04/01/2022  ?[place] and [place] Number: ? [place] ?  Facility and Address:  ?[facility], [place] [address], [place], [place] [address] ?     Provider Number: ?[id number]  ?Attending Physician Name and Address:  ?Courtney Heys, MD ? Relative Name and Phone Number:  ?Aeriana Speece [phone] ?   ?Current Level of Care: ?Other (Comment) (rehab) Recommended Level of Care: ?[place] Prior Approval Number: ?  ? ?Date Approved/Denied: ?  PASRR Number: ?[id number] A ? ?Discharge Plan: ?SNF ?  ? ?Current Diagnoses: ?Patient Active Problem List  ? Diagnosis Date Noted  ? Left humeral fracture 04/01/2022  ? DM (diabetes mellitus), type 2 with renal complications ([place]) 14/97/0263  ? Left elbow fracture 03/29/2022  ? Elbow fracture, left 03/28/2022  ? Multiple myeloma ([place]) 02/16/2022  ? ESRD on dialysis ([place]) 02/16/2022  ? Tricompartment osteoarthritis of knees (Bilateral) 02/16/2022  ? Osteoarthritis of knees (Bilateral) 02/16/2022  ? Osteoarthritis of knee (Right) 02/16/2022  ? Osteoarthritis involving multiple joints 02/16/2022  ? DDD (degenerative disc disease), cervical 02/16/2022  ? Anterolisthesis of cervical spine (C4/C5 and C7/T1) 02/16/2022  ? Protrusion of cervical intervertebral disc (C3-4) 02/16/2022  ? Chronic rotator cuff arthropathy of shoulder (Right) 02/16/2022  ? Superior shoulder subluxation, sequela (Right) 02/16/2022  ? Osteoarthritis of AC (acromioclavicular) joint (Right) 02/16/2022  ? Osteoarthritis of glenohumeral joint (Right) 02/16/2022  ? Osteoarthritis of glenohumeral joint (Left) 02/16/2022  ? Shoulder subluxation, sequela (Left) 02/16/2022  ? Osteoarthritis of hip (Left) 02/16/2022  ? Effusion of knee joints (Bilateral) 02/16/2022  ? Hip joint effusion (Left) 02/16/2022  ?  Osteopenia determined by x-ray 02/16/2022  ? Patellofemoral arthritis of knee (Left) 02/16/2022  ? Chronic shoulder pain (2ry area of Pain) (Bilateral) (L>R) 02/06/2022  ? Abnormal CT scan, cervical spine (02/26/2019) 02/06/2022  ? Chronic pain syndrome 02/05/2022  ? Pharmacologic therapy 02/05/2022  ? Disorder of skeletal system 02/05/2022  ? Problems influencing health status 02/05/2022  ? Acquired hypothyroidism 02/02/2022  ? Pelvic relaxation due to uterovaginal prolapse, incomplete 02/02/2022  ? End stage renal disease ([place]) 02/02/2022  ? Neuropathy due to chemotherapeutic drug ([place]) 01/05/2021  ? Encounter regarding vascular access for dialysis for ESRD ([place]) 01/05/2021  ? 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 Recurrent major depressive disorder, in partial remission ([place]) 05/11/2020  ? Closed fracture of femur, intertrochanteric, sequela (Left) 11/11/2018  ? Chronic rotator cuff arthropathy of shoulder (Left) 11/11/2018  ? Chronic knee pain (1ry area of Pain) (Bilateral) (L>R) 06/18/2018  ? CKD (chronic kidney disease), stage V ([place]) 06/02/2018  ? Inability to ambulate due to multiple joints 06/02/2018  ? Hypertension secondary to other renal disorders 06/22/2017  ? Secondary hyperparathyroidism ([place]) 01/31/2017  ? 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  ? 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 Leukocytosis 07/04/2016  ? E-coli UTI 07/04/2016  ? Essential hypertension, malignant 07/04/2016  ? Pyelonephritis 07/02/2016  ? Hematuria 07/02/2016  ? Abdominal pain 07/02/2016  ? Acute on chronic renal failure ([place]) 07/02/2016  ? Rheumatoid arthritis, seropositive ([place]) 03/06/2016  ? Onychogryphosis 06/29/2015  ? Onychomycosis due to dermatophyte 06/29/2015  ? Bradycardia, sinus 05/03/2015  ? Premature contractions, atrial 05/03/2015  ? Anemia 01/21/2013  ? Chronic kidney disease, stage V (very severe) ([place]) 09/18/2012  ? Chronic pain disorder 07/16/2012  ?  History of total hip replacement (Right) 07/08/2012  ? Osteoarthritis of knee (Left) 04/10/2012  ? Constipation 03/13/2012  ? Bilateral lower extremity edema 02/08/2012  ? Cataracts, bilateral 12/28/2011  ? Hyperlipidemia with target LDL less than 100 12/28/2011  ? Osteoporosis, post-menopausal 12/28/2011  ? ? ?Orientation RESPIRATION BLADDER Height & Weight   ?  ?Self, Situation, Place ? Normal Continent Weight: 144 lb 13.5 oz (65.7 kg) ?Height:  $RemoveB'5\' 5"'bJsEVHFU$  (165.1 cm)  ?BEHAVIORAL SYMPTOMS/MOOD NEUROLOGICAL BOWEL NUTRITION STATUS  ?    Continent Diet (renal diet 1200 fluid restriction)  ?AMBULATORY STATUS COMMUNICATION OF NEEDS Skin   ?Limited Assist Verbally Surgical wounds ?  ?  ?  ?    ?     ?     ? ? ?Personal Care Assistance Level of Assistance  ?Bathing, Dressing Bathing Assistance: Maximum assistance ?Feeding assistance: Independent ?Dressing Assistance: Maximum assistance ?   ? ?Functional Limitations Info  ?Hearing Sight Info: Adequate ?Hearing Info: Impaired (speak into her right ear) ?Speech Info: Adequate  ? ? ?SPECIAL CARE FACTORS FREQUENCY  ?PT (By licensed PT), OT (By licensed OT)   ?  ?PT Frequency: 5x week ?OT Frequency: 5x week ?  ?  ?  ?   ? ? ?Contractures Contractures Info: Not present  ? ? ?Additional Factors Info  ?Code Status, Allergies Code Status Info: Full Code ?Allergies Info: Byetta-Exenatide, Ibuprofen, Lisinopril Aspirin ?  ?  ?  ?   ? ?Current Medications (04/05/2022):  This is the current hospital active medication list ?Current Facility-Administered Medications  ?Medication Dose Route Frequency Provider Last Rate Last Admin  ? 0.9 %  sodium chloride infusion  100 mL Intravenous PRN Lovorn, Megan, MD      ? 0.9 %  sodium chloride infusion  100 mL Intravenous PRN Lovorn, Megan, MD      ? acetaminophen (TYLENOL) tablet 650 mg  650 mg Oral Q6H Lovorn, Megan, MD   650 mg at 04/05/22 0600  ? alteplase (CATHFLO ACTIVASE) injection 2 mg  2 mg Intracatheter Once PRN Lovorn, Jinny Blossom, MD      ?  amLODipine (NORVASC) tablet 2.5 mg  2.5 mg Oral Daily Lovorn, Megan, MD   2.5 mg at 04/05/22 0756  ? ascorbic acid (VITAMIN C) tablet 250 mg  250 mg Oral Daily Raulkar, Clide Deutscher, MD   250 mg at 04/05/22 0756  ? atorvastatin (LIPITOR) tablet 40 mg  40 mg Oral Daily Lovorn, Megan, MD   40 mg at 04/04/22 1751  ? calcium carbonate (TUMS - dosed in mg elemental calcium) chewable tablet 400 mg of elemental calcium  400 mg of elemental calcium Oral Q4H PRN Love, Pamela S, PA-C      ? camphor-menthol (SARNA) lotion   Topical PRN Izora Ribas, MD   Given at 04/04/22 2128  ? Chlorhexidine Gluconate Cloth 2 % PADS 6 each  6 each Topical BID Courtney Heys, MD   6 each at 04/05/22 0600  ? cholecalciferol (VITAMIN D3) tablet 2,000 Units  2,000 Units Oral Daily Courtney Heys, MD   2,000 Units at 04/05/22 0756  ? docusate sodium (COLACE) capsule 100 mg  100 mg Oral BID Lovorn, Jinny Blossom, MD   100 mg at 04/05/22 0756  ? DULoxetine (CYMBALTA) DR capsule 60 mg  60 mg Oral Daily Lovorn, Megan, MD   60 mg at 04/05/22 0756  ? ferric gluconate (FERRLECIT) 250 mg in sodium chloride 0.9 % 250 mL IVPB  250 mg Intravenous Once per day on Tue  Sat Dwana Melena, MD      ? heparin injection 1,000 Units  1,000 Units Dialysis PRN Lovorn, Jinny Blossom, MD      ? heparin injection 5,000 Units  5,000 Units Subcutaneous Q8H Lovorn, Megan, MD   5,000 Units at 04/05/22 0600  ? hydrALAZINE (APRESOLINE) injection 10 mg  10 mg Intravenous Q4H PRN Lovorn, Megan, MD      ? hydrOXYzine (ATARAX) tablet 10 mg  10 mg Oral TID PRN Courtney Heys, MD   10 mg at 04/04/22 1313  ? insulin aspart (novoLOG) injection 0-6 Units  0-6 Units Subcutaneous TID AC & HS Luetta Nutting, FNP      ? levothyroxine (SYNTHROID) tablet 150 mcg  150 mcg Oral Q0600 Lovorn, Jinny Blossom, MD   150 mcg at 04/05/22 0600  ? lidocaine (PF) (XYLOCAINE) 1 % injection 5 mL  5 mL Intradermal PRN Lovorn, Megan, MD      ? lidocaine-prilocaine (EMLA) cream 1 application.  1 application. Topical PRN Lovorn, Megan,  MD      ? melatonin tablet 5 mg  5 mg Oral QHS PRN Lovorn, Megan, MD   5 mg at 04/03/22 2240  ? metoCLOPramide (REGLAN) tablet 5 mg  5 mg Oral Q8H PRN Lovorn, Megan, MD      ? Or  ? metoCLOPramide (REGLAN) injection

## 2022-04-05 NOTE — Progress Notes (Signed)
Occupational Therapy Session Note ? ?Patient Details  ?Name: Christina Mercado ?MRN: [id number] ?Date of Birth: 1936/11/26 ? ?Today's Date: 04/05/2022 ?OT Individual Time: [phone] ?OT Individual Time Calculation (min): 56 min  ? ? ?Short Term Goals: ?Week 1:  OT Short Term Goal 1 (Week 1): Pt will perform toilet/BSC transfer with LRAD and MIN A ?OT Short Term Goal 2 (Week 1): Pt will adhere to LUE NWB during transfers/ADL with no more than MIN verbal cues ?OT Short Term Goal 3 (Week 1): Pt will perform LB dress with AE PRN and MIN A ?OT Short Term Goal 4 (Week 1): Pt will perform 2/3 toileting tasks with CGA for standing balance ? ?Skilled Therapeutic Interventions/Progress Updates:  ?Skilled OT intervention completed with focus on ADL retraining, toileting. Pt received upright in bed, reporting 6/10 pain, pre-medicated and not due for meds yet, with therapist providing repositioning on pillows and rest breaks for pain management. Completed bed mobility with min A for trunk control, and CGA for scooting forward. Pt bathed EOB with min A for UB, CGA for LB excluding feet, with CGA needed for anterior leans forward. Applied lotion to back and legs per pt request 2/2 itching. Donned shirt with mod A after education on hemi-technique provided, with therapist retrieving larger shirt for pain management and ease of donning. Mod A needed for donning sling, with pt limited with following instructions 2/2 pain. Completed sit > stand with CGA using quad cane on R side, with pt found to be heavily void incontinent in brief, unaware, with therapist doffing with total A in stance, completing perineal care. Of note- discharge found upon wiping anteriorly, with pt reporting she has an infection that is being treated with RN aware. Ensured dryness, then donned new brief in stance with total A and CGA to min A for balance. Required mod A for donning LB clothing at the sit > stand level. Upon stance, pt reported urgent need to void, with  completion of ambulatory transfers with quad cane and CGA to [facility] in bathroom, with total A for pants management and toileting. Pt reported burning sensation but unable to void/BM. Ambulated to recliner at same assist, with mod encouragement needed as pt c/o fatigue and fear of falling upon any stance/ambulatory transfers. Good demonstration of NWB through LUE throughout session. Pt was left upright in recliner, with LUE propped on pillows for comfort, belt alarm on and pt's son present at pt's side with all immediate needs met at end of session. ? ? ?Therapy Documentation ?Precautions:  ?Precautions ?Precautions: Fall ?Precaution Comments: LUE NWB ?Restrictions ?Weight Bearing Restrictions: Yes ?LUE Weight Bearing: Non weight bearing ? ? ? ?Therapy/Group: Individual Therapy ? ?Tanara Turvey E Qamar Aughenbaugh ?04/05/2022, 7:26 AM ?

## 2022-04-05 NOTE — Progress Notes (Signed)
They have with her ?Christina Mercado ?Progress Note  ? ?86 y.o. 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 She usually dialyzes Tues and Thur with [facility] [place] [facility]. Unfortunately she tripped and fell sustaining a supracondylar distal humerus fracture s/p ORIF on 4/19. She actually had a RIJ TC placed by [facility] as the left upper arm access would not be accessible with the fracture + ORIF.  ? ?Editor, commissioning on [place] ?Tues Sat 3hr 15 min EDW 64kg ?Venofer 50qw  Mircera 50 q4 (last 4/11) ?Calcitriol 0.25 BIW ?H1300 bolus 600/h ? ?Assessment/ Plan:   ?ESRD - usually dialyzed Tues and Sat at [facility] on [address] last HD on Tuesday.  ?Tues/Sat schedule here, will do today after missing 4/25 ?3h64mn, 2K, 400/600; No heparin; 2-3L UF ?R IJ TDC, has AVF but not accessible during injury/sling ? ?Left elbow fracture - appreciate vascular placing a RIJ TC and will need to wait till left arm is accessible before attempting dialysis through [facility]. S/p ORIF left supracondylar humerus fracture on 4/19.  ?Renal osteodystrophy - phos -> up/down on renvela 1 tab qAC, CTM ?Anemia - TSAT 12%; iv iron on dialysis; not due for her ESA yet (last given 4/11).  On Mircera as outpatient. 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  [facility] ?Subjective:   ?Seen in room ?Son present ?Did not rec HD yesterday ?  ? ?Objective:   ?BP (!) 136/58 (BP Location: Right Arm)   Pulse 78   Temp 98.7 ?F (37.1 ?C) (Oral)   Resp 18   Ht '5\' 5"'$  (1.651 m)   Wt 65.7 kg   SpO2 97%   BMI 24.10 kg/m?  ? ?Intake/Output Summary (Last 24 hours) at 04/05/2022 1258 ?Last data filed at 04/05/2022 0856 ?Gross per 24 hour  ?Intake 995 ml  ?Output --  ?Net 995 ml  ? ? ?Weight change: -2.6 kg ? ?Physical Exam: ?GEN: NAD, A&Ox3, NCAT, pleasant, very hard of hearing, in wheelchair ?HEENT: No conjunctival pallor,  EOMI ?LUNGS: CTA B/L no rales, rhonchi or wheezing ?CV: RRR, No M/R/G ?ABD: SNDNT +BS  ?EXT: No lower extremity edema ?ACCESS: lt arm + bruit even thru bandages, RIJ TC ? ? ? ?Imaging: ?No results found. ? ?Labs: ?BMET ?Recent Labs  ?Lab 03/31/22 ?1637 04/01/22 ?0231 04/01/22 ?1543 04/02/22 ?0417404/23/23 ?1529 04/03/22 ?0081404/25/23 ?0481804/26/23 ?0606  ?NA 137 135 137 136 136 132* 133* 131*  ?K 3.8 4.3 3.7 5.4* 4.4 5.1 5.3* 5.7*  ?CL 98 100 97* 100 94* 97* 97* 98  ?CO2 '24 22 24 26 29 25 25 22  '$ ?GLUCOSE 163* 94 212* 91 145* 89 110* 86  ?BUN 65* 68* 30* 36* 46* 53* 64* 70*  ?CREATININE 6.86* 6.98* 7.47* 4.16* 4.72* 5.09* 6.06* 6.37*  ?CALCIUM 8.5* 8.6* 11.4* 9.3 9.4 8.9 9.1 8.9  ?PHOS 7.0* 7.2* 3.8 5.6*  --  7.0* 7.6* 7.7*  ? ? ?CBC ?Recent Labs  ?Lab 03/31/22 ?0420 04/01/22 ?0231 04/02/22 ?05631 ?WBC 8.6 9.5 7.2  ?NEUTROABS  --   --  4.3  ?HGB 9.3* 8.5* 9.8*  ?HCT 29.4* 27.5* 30.6*  ?MCV 97.4 96.5 97.1  ?PLT 165 190 190  ? ? ? ?Medications:   ? ? acetaminophen  650 mg Oral Q6H  ? amLODipine  2.5 mg Oral Daily  ? vitamin C  250 mg Oral Daily  ? atorvastatin  40 mg Oral Daily  ?  Chlorhexidine Gluconate Cloth  6 each Topical BID  ? cholecalciferol  2,000 Units Oral Daily  ? docusate sodium  100 mg Oral BID  ? DULoxetine  60 mg Oral Daily  ? heparin injection (subcutaneous)  5,000 Units Subcutaneous Q8H  ? insulin aspart  0-6 Units Subcutaneous TID AC & HS  ? levothyroxine  150 mcg Oral Q0600  ? polyethylene glycol  17 g Oral Daily  ? sevelamer carbonate  800 mg Oral TID WC  ? sorbitol  45 mL Oral Once  ? ?Rexene Agent, MD  ?04/05/2022, 12:58 PM  ? ?

## 2022-04-05 NOTE — Progress Notes (Signed)
Occupational Therapy Session Note ? ?Patient Details  ?Name: Emanuelle Hammerstrom ?MRN: [id number] ?Date of Birth: August 02, 1936 ? ?Today's Date: 04/05/2022 ?OT Missed Time: 30 Minutes ?Missed Time Reason: Unavailable (comment) (HD) ? ? ?Short Term Goals: ?Week 1:  OT Short Term Goal 1 (Week 1): Pt will perform toilet/BSC transfer with LRAD and MIN A ?OT Short Term Goal 2 (Week 1): Pt will adhere to LUE NWB during transfers/ADL with no more than MIN verbal cues ?OT Short Term Goal 3 (Week 1): Pt will perform LB dress with AE PRN and MIN A ?OT Short Term Goal 4 (Week 1): Pt will perform 2/3 toileting tasks with CGA for standing balance ? ?  Attempted to see pt for scheduled OT session, out of room at this time at HD.  ? ? ? ?Volanda Napoleon MS, OTR/L ? ?04/05/2022, 6:59 AM ?

## 2022-04-05 NOTE — Progress Notes (Signed)
Physical Therapy Session Note ? ?Patient Details  ?Name: Christina Mercado ?MRN: [id number] ?Date of Birth: November 28, 1936 ? ?Today's Date: 04/05/2022 ?PT Individual Time: 3545-6256 ?PT Individual Time Calculation (min): 57 min  ? ?Short Term Goals: ?Week 1:  PT Short Term Goal 1 (Week 1): Pt will complete bed mobility with minA ?PT Short Term Goal 2 (Week 1): Pt will complete bed<>chair transfers with minA and LRAD ?PT Short Term Goal 3 (Week 1): Pt will ambulate 82f with minA and LRAD ?PT Short Term Goal 4 (Week 1): Pt will improve BERG balance score by at least 7 points to indicate improved balance. ? ?Skilled Therapeutic Interventions/Progress Updates: Tx1:  Pt presented in recliner sleeping but easily aroused and agreeable to therapy. Pt states pain 6/10 in L arm, repositioning and rest breaks provided as needed. Pt indicating possible need for urinary void. PTA donned socks and shoes total A and pt performed ambulatory transfer to toilet with LBQS. Pt ambulated ~142fwith CGA. Pt required maxA for clothing management and was CGA for toilet transfers (- void). Pt was able to stand from elevated toilet to allow PTA to pull pants over hips. Pt then ambulated to w/c with CGA. Pt transported to 5th FL gym and participated in block practice transfers w/c to/from standard chair with arm rests. Pt required intermittent cues for hand placement as initially would place [facility] to standing. Pt also performed ankle pumps and clockwise/counterclockwise ankle circles during seated rest breaks. Pt expressed sensation for urinary void again therefore ambulated to bathroom in rehab gym in same manner as prior and required same assistance. Pt remained that currently has UTI and may experience more sensation for urinary void than usual. Pt returned to w/c ~16f20f[facility] room. Performed ambulatory transfer to recliner with CGA. Pt left in recliner at end of session with belt alarm on, call bell within  reach, LUE propped on pillow with ice pack placed under pt's arm.  ? ?Tx2: Pt missed 60 min skilled PT due to pt being at HD (pt missed HD Tues as per normal HD schedule).  ?   ? ?Therapy Documentation ?Precautions:  ?Precautions ?Precautions: Fall ?Precaution Comments: LUE NWB ?Restrictions ?Weight Bearing Restrictions: Yes ?LUE Weight Bearing: Non weight bearing ?General: ?PT Amount of Missed Time (min): 60 Minutes ?PT Missed Treatment Reason: Unavailable (Comment) (Pt at HD) ?Vital Signs: ? ?Pain: ?Pain Assessment ?Pain Scale: 0-10 ?Pain Score: 8  ?Pain Location: Arm ?Pain Orientation: Left ?Pain Intervention(s): Medication (See eMAR);Cold applied ? ? ?Therapy/Group: Individual Therapy ? ?Judea Riches ?04/05/2022, 12:38 PM  ?

## 2022-04-05 NOTE — Progress Notes (Signed)
?                                                       PROGRESS NOTE ? ? ?Subjective/Complaints: ? ?Pt reports no BM- actually wasn't sure if she went- couldn't find documentation of having BM.Marland Kitchen  ?Ordered another dose Sorbitol and soap suds enema this afternoon.  ?Still has LUE pain- said she expected it to go away- explained it will take awhile due to fracture and surgery.  ? ? ? ?ROS:  ? ?Pt denies SOB, abd pain, CP, N/V/(+)C/D, and vision changes ? ? ?Objective: ?  ?No results found. ?No results for input(s): WBC, HGB, HCT, PLT in the last 72 hours. ? ?Recent Labs  ?  04/04/22 ?8115 04/05/22 ?0606  ?NA 133* 131*  ?K 5.3* 5.7*  ?CL 97* 98  ?CO2 25 22  ?GLUCOSE 110* 86  ?BUN 64* 70*  ?CREATININE 6.06* 6.37*  ?CALCIUM 9.1 8.9  ? ? ?Intake/Output Summary (Last 24 hours) at 04/05/2022 1850 ?Last data filed at 04/05/2022 1700 ?Gross per 24 hour  ?Intake 990 ml  ?Output 1896 ml  ?Net -906 ml  ?  ? ?  ? ?Physical Exam: ?Vital Signs ?Blood pressure (!) 105/45, pulse 85, temperature 98.4 ?F (36.9 ?C), temperature source Oral, resp. rate 16, height '5\' 5"'$  (1.651 m), weight 66.1 kg, SpO2 95 %. ? ? ? ?General: awake, alert, appropriate, woken up; supine in bed; wearing sling LUE; NAD ?HENT: conjugate gaze; oropharynx moist ?CV: regular rate; no JVD ?Pulmonary: CTA B/L; no W/R/R- good air movement ?GI: soft, NT, ND, (+)BS ?Psychiatric: appropriate but irritable about being woken up ?Neurological: alert- woke from sleep ?Musculoskeletal:  ?   Cervical back: Neck supple. No tenderness.  ?   Comments: UE strength on RUE 5-/5 ?LUE- grip 5-/5 otherwise not tested ?Can lift against gravity ?LE's- 5-5 throughout B/L   ?Skin: ?   General: Skin is warm and dry.  ?   Comments: HD catheter ?L hand mild- moderate swelling- ?IV R forearm ?Incision covered with ACE wrap ?  ?Neurological:  ?   Comments: Intact to light touch in all 4 extremties  ?Psychiatric:  ?   Comments: Slightly flat- but interactive  ? ?Assessment/Plan: ?1. Functional  deficits which require 3+ hours per day of interdisciplinary therapy in a comprehensive inpatient rehab setting. ?Physiatrist is providing close team supervision and 24 hour management of active medical problems listed below. ?Physiatrist and rehab team continue to assess barriers to discharge/monitor patient progress toward functional and medical goals ? ?Care Tool: ? ?Bathing ?   ?Body parts bathed by patient: Left arm, Chest, Abdomen, Right upper leg, Left upper leg, Right lower leg, Left lower leg, Face  ? Body parts bathed by helper: Right arm ?  ?  ?Bathing assist Assist Level: Minimal Assistance - Patient > 75% ?  ?  ?Upper Body Dressing/Undressing ?Upper body dressing   ?What is the patient wearing?: Pull over shirt ?   ?Upper body assist Assist Level: Moderate Assistance - Patient 50 - 74% ?   ?Lower Body Dressing/Undressing ?Lower body dressing ? ? ?   ?What is the patient wearing?: Pants, Incontinence brief ? ?  ? ?Lower body assist Assist for lower body dressing: Moderate Assistance - Patient 50 - 74% ?   ? ?Toileting ?Toileting    ?  Toileting assist Assist for toileting: Maximal Assistance - Patient 25 - 49% ?  ?  ?Transfers ?Chair/bed transfer ? ?Transfers assist ?   ? ?Chair/bed transfer assist level: Contact Guard/Touching assist ?Chair/bed transfer assistive device: Cane (quad cane) ?  ?Locomotion ?Ambulation ? ? ?Ambulation assist ? ?   ? ?Assist level: Contact Guard/Touching assist ?Assistive device: Cane-quad ?Max distance: 43f  ? ?Walk 10 feet activity ? ? ?Assist ?   ? ?Assist level: Minimal Assistance - Patient > 75% ?Assistive device: Cane-quad  ? ?Walk 50 feet activity ? ? ?Assist Walk 50 feet with 2 turns activity did not occur: Safety/medical concerns ? ?  ?   ? ? ?Walk 150 feet activity ? ? ?Assist Walk 150 feet activity did not occur: Safety/medical concerns ? ?  ?  ?  ? ?Walk 10 feet on uneven surface  ?activity ? ? ?Assist Walk 10 feet on uneven surfaces activity did not occur:  Safety/medical concerns ? ? ?  ?   ? ?Wheelchair ? ? ? ? ?Assist Is the patient using a wheelchair?: Yes ?Type of Wheelchair: Manual ?  ? ?Wheelchair assist level: Maximal Assistance - Patient 25 - 49% ?Max wheelchair distance: 1512f ? ? ?Wheelchair 50 feet with 2 turns activity ? ? ? ?Assist ? ?  ?  ? ? ?Assist Level: Maximal Assistance - Patient 25 - 49%  ? ?Wheelchair 150 feet activity  ? ? ? ?Assist ?   ? ? ?Assist Level: Maximal Assistance - Patient 25 - 49%  ? ?Blood pressure (!) 105/45, pulse 85, temperature 98.4 ?F (36.9 ?C), temperature source Oral, resp. rate 16, height '5\' 5"'$  (1.651 m), weight 66.1 kg, SpO2 95 %. ? ? ? Medical Problem List and Plan: ?1. Functional deficits secondary to  L humeral fracture NWB due to ground level fall, s/p ORIF on 4/19 by Dr. HaDoreatha Martin ?            -patient may not shower- HD catheter ?            -ELOS/Goals: 12-14 days- mod I ? Looking at possible SNF if doesn't progress ? Con't CIR_ PT, and OT- likely was high fall risk BEFORE admission ?2.  Antithrombotics: ?-DVT/anticoagulation:  Pharmaceutical: Heparin ?            -antiplatelet therapy: n/a ?3. Postoperative pain from left humerus fracture: con't tylenol and Oxycodone prn. Add vitamin C to help with pain sensitivity ? 4/25- will change Oxycodone to 10 mg q4 hours prn- see if that helps, but monitor constipation, since already an issue. ? 4/26- pt doesn't see any difference in pain, but hasn't had pain meds yet this AM ?4. Mood: continue social support and encouragement-  ?            -antipsychotic agents: n/a ?5. Neuropsych: This patient is capable of making decisions on her own behalf, however has STM deficits. ?6. Skin/Wound Care: local wound care for incision and HD catheter care and CHG baths ?7. Fluids/Electrolytes/Nutrition: on HD ?8. ESRD- On HD T/S- per renal ?9. Hard of hearing ?10. DM- on SSI- no oral agents due to DM ?11. HTN- actually been having some orthostatic hypotension- will defer to renal to help  - got IV bolus today.  ? 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 [facility] ?13. Tachycardia- HR running 100-s to 110s at rest- likely due to decreased fluid levels- IV bolus given  this AM- labs in AM ? 4/26- HR 80s- doing better ?14. UTI- on Rocephin IV-needs 7 days ?15. Constipation- LBM 3-4 days ago- will give Sorbitol 15 cc and repeat if needed.  ?16. L humeral fx- NWB on LUE. ?17. Insomnia- on melatonin-  ?18. Nausea/vomiting- has Zofran and Reglan prn- will monitor- - might be due to Constipation- see if improves with BM ? 4/25- likely constipation- working on it.  ? 4/26- Another dose Sorbitol and soap suds enema today ?19. Neck pain: add kpad.  ?20. Overweight: BMI 25.09: provide dietary education ?21/. Constipation ? 4/25- will give sorbitol 30 cc at 10 am since has HD today. Also con't Colace 1 tab BID for hard balls. Also added miralax ? 4/26- sorbitol 45cc and soap suds enema given ?22. Hyperkalemia ? 4/25- K+ 5.3- per renal.  ? 4/26- K 5.7- missed HD yesterday- to get today.  ? ? ? ? ?LOS: ?4 days ?A FACE TO FACE EVALUATION WAS PERFORMED ? ?Amali Uhls ?04/05/2022, 6:50 PM  ? ?  ?

## 2022-04-05 NOTE — Progress Notes (Signed)
Patient asleep for shop periods of time throughout shift, states she is uncomfortable with discomfort of pain, frequently when patient is awake she has be toileted,and repositioned with medication for pai discomfort, provided,monitor and assist   ?

## 2022-04-06 LAB — RENAL FUNCTION PANEL
Albumin: 2.4 g/dL — ABNORMAL LOW (ref 3.5–5.0)
Anion gap: 7 (ref 5–15)
BUN: 22 mg/dL (ref 8–23)
CO2: 27 mmol/L (ref 22–32)
Calcium: 9.1 mg/dL (ref 8.9–10.3)
Chloride: 100 mmol/L (ref 98–111)
Creatinine, Ser: 3.47 mg/dL — ABNORMAL HIGH (ref 0.44–1.00)
GFR, Estimated: 12 mL/min — ABNORMAL LOW (ref >60)
Glucose, Bld: 83 mg/dL (ref 70–99)
Phosphorus: 5.3 mg/dL — ABNORMAL HIGH (ref 2.5–4.6)
Potassium: 4.7 mmol/L (ref 3.5–5.1)
Sodium: 134 mmol/L — ABNORMAL LOW (ref 135–145)

## 2022-04-06 LAB — GLUCOSE, CAPILLARY
Glucose-Capillary: 67 mg/dL — ABNORMAL LOW (ref 70–99)
Glucose-Capillary: 126 mg/dL — ABNORMAL HIGH (ref 70–99)
Glucose-Capillary: 92 mg/dL (ref 70–99)
Glucose-Capillary: 81 mg/dL (ref 70–99)
Glucose-Capillary: 175 mg/dL — ABNORMAL HIGH (ref 70–99)

## 2022-04-06 MED ORDER — SORBITOL 70 % SOLN: 30.0000 mL | Freq: Every day | Status: DC | PRN

## 2022-04-06 NOTE — Progress Notes (Signed)
Physical Therapy Session Note ? ?Patient Details  ?Name: Samah Lapiana ?MRN: [id number] ?Date of Birth: 1936/02/08 ? ?Today's Date: 04/06/2022 ?PT Individual Time: 6015-6153; 7943-2761 ?PT Individual Time Calculation (min): 57 min and 20 mins ?Missed 55 mins due to fatigue ? ?Short Term Goals: ?Week 1:  PT Short Term Goal 1 (Week 1): Pt will complete bed mobility with minA ?PT Short Term Goal 2 (Week 1): Pt will complete bed<>chair transfers with minA and LRAD ?PT Short Term Goal 3 (Week 1): Pt will ambulate 23f with minA and LRAD ?PT Short Term Goal 4 (Week 1): Pt will improve BERG balance score by at least 7 points to indicate improved balance. ? ?Skilled Therapeutic Interventions/Progress Updates:  ?   Session 1: Patient received supine in bed, asleep, easy to wake and agreeable to PT. She reports pain in L UE, did not rate, premedicated. PT providing rest breaks, distractions and repositioning to assist with pain management. Patient coming to sit edge of bed with ModA and verbal cues for sequencing and process for scooting anteriorly. Donning pants with ModA in standing. MinA stand pivot to wc. Patient requesting to use bathroom. Short distance ambulatory transfer [facility]. Patient with continent void, but unaware that she voided. Supervision for perihygiene in standing and ModA for clothing management. Patient requesting to have L UE re-wrapped as the ACE felt too tight. PT transporting patient in wc to therapy gym for time management and energy conservation. PT re-wrapping L UE for patient comfort. She ambulated short distance with MinA/ModA and very short B step length, shuffle. Patient remaining up in recliner, seatbelt alarm on, call light within reach, MD at bedside.  ? ? ?Session 2: Patient received sitting up in recliner, reporting L UE pain and fatigue. She requested assist to the bathroom and then to bed, but was not agreeable to participate in full therapy session due to fatigue.  CGA/MinA ambulatory [facility] with continent void, supervision for perihygiene in standing and MinA for clothing management. [facility]. ModA to return supine and reposition in bed. Bed alarm on, call light within reach, RN aware of patients reports of pain.  ? ? ?Therapy Documentation ?Precautions:  ?Precautions ?Precautions: Fall ?Precaution Comments: LUE NWB ?Restrictions ?Weight Bearing Restrictions: Yes ?LUE Weight Bearing: Non weight bearing ? ? ? ?Therapy/Group: Individual Therapy ? ?JDebbora Dus?JDebbora Dus PT, DPT, CBIS ? ?04/06/2022, 9:07 AM  ?

## 2022-04-06 NOTE — Progress Notes (Signed)
Patient ID: Christina Mercado, female   DOB: 08-Apr-1936, 86 y.o.   MRN: [id number]  Met with pt and she is aware of the plan to pursue [facility] for more rehab and then hopefully back to [facility]. [facility] has chosen [facility] will sent [facility] and see if can offer a bed ?

## 2022-04-06 NOTE — Progress Notes (Signed)
They have with her ?[facility] ?Progress Note  ? ?86 y.o. 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 She usually dialyzes Tues and Thur with [facility] [place] [facility]. Unfortunately she tripped and fell sustaining a supracondylar distal humerus fracture s/p ORIF on 4/19. She actually had a RIJ TC placed by [facility] as the left upper arm access would not be accessible with the fracture + ORIF.  ? ?Editor, commissioning on [place] ?Tues Sat 3hr 15 min EDW 64kg ?Venofer 50qw  Mircera 50 q4 (last 4/11) ?Calcitriol 0.25 BIW ?H1300 bolus 600/h ? ?Assessment/ Plan:   ?ESRD - usually dialyzed Tues and Sat at [facility] on [address] last HD on Tuesday.  ?Tues/Sat schedule here ?3h67mn, 2K, 400/600; No heparin; 2-3L UF ?R IJ TDC, has AVF but not accessible during injury/sling, remains patent ? ?Left elbow fracture - appreciate vascular placing a RIJ TC and will need to wait till left arm is accessible before attempting dialysis through [facility]. S/p ORIF left supracondylar humerus fracture on 4/19.  ?Renal osteodystrophy - phos -> up/down on renvela 1 tab qAC, CTM ?Anemia - TSAT 12%; iv iron on dialysis; not due for her ESA yet (last given 4/11).  On Mircera as outpatient. 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  [facility]? ?Subjective:   ?Seen in room ?No c/o; working with therapy ?Positive attitude ?Tolerated HD yesterday 1.9 L UF  ? ?Objective:   ?BP (!) 113/59 (BP Location: Right Arm)   Pulse 93   Temp 98.6 ?F (37 ?C) (Oral)   Resp 16   Ht '5\' 5"'$  (1.651 m)   Wt 66.8 kg   SpO2 97%   BMI 24.51 kg/m?  ? ?Intake/Output Summary (Last 24 hours) at 04/06/2022 1206 ?Last data filed at 04/06/2022 0815 ?Gross per 24 hour  ?Intake 560 ml  ?Output 1996 ml  ?Net -1436 ml  ? ? ?Weight change: 2.2 kg ? ?Physical Exam: ?GEN: NAD, A&Ox3, NCAT, pleasant, very hard of hearing, in wheelchair ?HEENT:  EOMI ?NECK: Supple, no thyromegaly ?LUNGS: CTA B/L no rales, rhonchi or wheezing ?CV: RRR, No M/R/G ?ABD: SNDNT +BS  ?EXT: No lower extremity edema ?ACCESS: lt arm + bruit even thru bandages, RIJ TC ? ? ? ?Imaging: ?No results found. ? ?Labs: ?BMET ?Recent Labs  ?Lab 04/01/22 ?0231 04/01/22 ?1543 04/02/22 ?0962804/23/23 ?1529 04/03/22 ?0366204/25/23 ?0947604/26/23 ?0606 04/06/22 ?05465 ?NA 135 137 136 136 132* 133* 131* 134*  ?K 4.3 3.7 5.4* 4.4 5.1 5.3* 5.7* 4.7  ?CL 100 97* 100 94* 97* 97* 98 100  ?CO2 '22 24 26 29 25 25 22 27  '$ ?GLUCOSE 94 212* 91 145* 89 110* 86 83  ?BUN 68* 30* 36* 46* 53* 64* 70* 22  ?CREATININE 6.98* 7.47* 4.16* 4.72* 5.09* 6.06* 6.37* 3.47*  ?CALCIUM 8.6* 11.4* 9.3 9.4 8.9 9.1 8.9 9.1  ?PHOS 7.2* 3.8 5.6*  --  7.0* 7.6* 7.7* 5.3*  ? ? ?CBC ?Recent Labs  ?Lab 03/31/22 ?0420 04/01/22 ?0231 04/02/22 ?00354 ?WBC 8.6 9.5 7.2  ?NEUTROABS  --   --  4.3  ?HGB 9.3* 8.5* 9.8*  ?HCT 29.4* 27.5* 30.6*  ?MCV 97.4 96.5 97.1  ?PLT 165 190 190  ? ? ? ?Medications:   ? ? acetaminophen  650 mg Oral Q6H  ? amLODipine  2.5 mg Oral Daily  ? vitamin C  250 mg Oral Daily  ? atorvastatin  40 mg  Oral Daily  ? Chlorhexidine Gluconate Cloth  6 each Topical BID  ? cholecalciferol  2,000 Units Oral Daily  ? docusate sodium  100 mg Oral BID  ? DULoxetine  60 mg Oral Daily  ? heparin injection (subcutaneous)  5,000 Units Subcutaneous Q8H  ? insulin aspart  0-6 Units Subcutaneous TID AC & HS  ? levothyroxine  150 mcg Oral Q0600  ? polyethylene glycol  17 g Oral Daily  ? sevelamer carbonate  800 mg Oral TID WC  ? ?Rexene Agent, MD  ?04/06/2022, 12:06 PM  ? ?

## 2022-04-06 NOTE — Plan of Care (Signed)
?  Problem: Consults ?Goal: RH GENERAL PATIENT EDUCATION ?Description: See Patient Education module for education specifics. ?Outcome: Progressing ?Goal: Diabetes Guidelines if Diabetic/Glucose > 140 ?Description: If diabetic or lab glucose is > 140 mg/dl - Initiate Diabetes/Hyperglycemia Guidelines & Document Interventions  ?Outcome: Progressing ?  ?Problem: RH BOWEL ELIMINATION ?Goal: RH STG MANAGE BOWEL WITH ASSISTANCE ?Description: STG Manage Bowel with mod I Assistance. ?Outcome: Progressing ?Goal: RH STG MANAGE BOWEL W/MEDICATION W/ASSISTANCE ?Description: STG Manage Bowel with Medication with mod I Assistance. ?Outcome: Progressing ?  ?Problem: RH BLADDER ELIMINATION ?Goal: RH STG MANAGE BLADDER WITH ASSISTANCE ?Description: STG Manage Bladder With mod I Assistance ?Outcome: Progressing ?  ?Problem: RH SKIN INTEGRITY ?Goal: RH STG MAINTAIN SKIN INTEGRITY WITH ASSISTANCE ?Description: STG Maintain Skin Integrity With mod I Assistance. ?Outcome: Progressing ?  ?Problem: RH SAFETY ?Goal: RH STG ADHERE TO SAFETY PRECAUTIONS W/ASSISTANCE/DEVICE ?Description: STG Adhere to Safety Precautions With cues and reminders.  ?Outcome: Progressing ?Goal: RH STG DECREASED RISK OF FALL WITH ASSISTANCE ?Description: STG Decreased Risk of Fall With cues and reminders.  ?Outcome: Progressing ?  ?Problem: RH PAIN MANAGEMENT ?Goal: RH STG PAIN MANAGED AT OR BELOW PT'S PAIN GOAL ?Description: Pain level at or below 4 on scale of 0-10 ?Outcome: Progressing ?  ?Problem: RH KNOWLEDGE DEFICIT GENERAL ?Goal: RH STG INCREASE KNOWLEDGE OF SELF CARE AFTER HOSPITALIZATION ?Description: Pt will be able to demonstrate understanding of medication management and dietary restrictions with mod I assist.  ?Outcome: Progressing ?  ?

## 2022-04-06 NOTE — Progress Notes (Signed)
Hypoglycemic Event ? ?CBG: 67 ? ?Treatment: 8 oz juice/soda ? ?Symptoms: None ? ?Follow-up CBG: Time:0715 CBG Result:126 ? ?Possible Reasons for Event: Inadequate meal intake ? ?Comments/MD notified: ? ?Will inform ? ?Belva Chimes ? ? ?

## 2022-04-06 NOTE — Progress Notes (Signed)
Occupational Therapy Session Note ? ?Patient Details  ?Name: Christina Mercado ?MRN: [id number] ?Date of Birth: [birth year] ? ?Today's Date: 04/06/2022 ?OT Individual Time: 1050-1200 ?OT Individual Time Calculation (min): 70 min  ? ? ?Short Term Goals: ?Week 1:  OT Short Term Goal 1 (Week 1): Pt will perform toilet/BSC transfer with LRAD and MIN A ?OT Short Term Goal 2 (Week 1): Pt will adhere to LUE NWB during transfers/ADL with no more than MIN verbal cues ?OT Short Term Goal 3 (Week 1): Pt will perform LB dress with AE PRN and MIN A ?OT Short Term Goal 4 (Week 1): Pt will perform 2/3 toileting tasks with CGA for standing balance ? ?Skilled Therapeutic Interventions/Progress Updates:  ?  Pt received in recliner napping. She awoke easily. Pt stated she was very sleepy but willing to participate.  Pt c/o L arm pain and stating she could not get into a comfortable position.  Fabricated a rectangular block with 2 egg crates with towels into between covered by a pillow case.  Positioned this under her L arm and pt stated this was much more comfortable.  ?For gentle AROM for LUE, placed arm on powder board with pillow case under arm. Pt worked on finger scrunches and gentle elbow AROM.  Pt able to move her arm in progressively larger ROM and stated "oh wow this feels better". ? ?Adjusted sling and had pt work on ambulating to bathroom.  Pt stood from low recliner with min A then used quad cane to walk to bathroom with CGA, she was able to sit down and stand up from elevated toilet with close supervision.  She was not able to void.  Pt did need min A to manage clothing.  ? ?Pt ambulated back to recliner.  Placed rectangular block under L arm and loosened sling. Pt stated she was comfortable.   ?Pt in recliner with belt alarm on and all needs met.  Her son in room with patient.  ? ?Therapy Documentation ?Precautions:  ?Precautions ?Precautions: Fall ?Precaution Comments: LUE NWB ?Restrictions ?Weight Bearing Restrictions:  Yes ?LUE Weight Bearing: Non weight bearing ?Pain: ?Pain Assessment ?Pain Scale: 0-10 ?Pain Score: 5  ?Pain Type: Acute pain;Surgical pain ?Pain Location: Elbow ?Pain Orientation: Left ?Pain Descriptors / Indicators: Sore;Sharp;Aching ?Pain Frequency: Constant (worse with activity) ?Pain Onset: On-going ?ADL: ?ADL ?Eating: Supervision/safety ?Grooming: Supervision/safety ?Upper Body Bathing: Moderate assistance ?Lower Body Bathing: Moderate assistance ?Upper Body Dressing: Moderate assistance ?Lower Body Dressing: Maximal assistance ?Toileting: Maximal assistance ?Toilet Transfer: Moderate assistance ? ? ?Therapy/Group: Individual Therapy ? ?[place] ?04/06/2022, 11:28 AM ?

## 2022-04-06 NOTE — Progress Notes (Signed)
?                                                       PROGRESS NOTE ? ? ?Subjective/Complaints: ?No acute events overnight.  Reports she continues  LUE to have pain but medications keeping this under control.  She does not feel constipated but denies BM today or yesterday. Reports nausea is improved. Reports LUE swelling is improving.  ? ? ?ROS:  ? ?Pt denies Chest pain, SOB, HA, Abdominal pain, Nausea, Vomiting ? ? ?Objective: ?  ?No results found. ?No results for input(s): WBC, HGB, HCT, PLT in the last 72 hours. ? ?Recent Labs  ?  04/05/22 ?0606 04/06/22 ?5956  ?NA 131* 134*  ?K 5.7* 4.7  ?CL 98 100  ?CO2 22 27  ?GLUCOSE 86 83  ?BUN 70* 22  ?CREATININE 6.37* 3.47*  ?CALCIUM 8.9 9.1  ? ? ?Intake/Output Summary (Last 24 hours) at 04/06/2022 0748 ?Last data filed at 04/05/2022 2205 ?Gross per 24 hour  ?Intake 680 ml  ?Output 1996 ml  ?Net -1316 ml  ?  ? ?  ? ?Physical Exam: ?Vital Signs ?Blood pressure (!) 113/59, pulse 93, temperature 98.6 ?F (37 ?C), temperature source Oral, resp. rate 16, height '5\' 5"'$  (1.651 m), weight 66.8 kg, SpO2 97 %. ? ?Constitutional: No distress . Vital signs reviewed. ?HEENT: NCAT, EOMI, oral membranes moist ?Neck: supple ?Cardiovascular: RRR without murmur. No JVD    ?Respiratory/Chest: CTA Bilaterally without wheezes or rales. Normal effort    ?GI/Abdomen: BS +, non-tender, non-distended ?Ext: no clubbing, cyanosis, or edema ?Psych: very pleasant and cooperative ? ?Musculoskeletal:  ?   Comments: UE strength on RUE 5-/5 ?LUE- grip 5-/5 otherwise not tested ?Can lift against gravity ?LE's- 5-5 throughout B/L   ?Skin: ?   General: Skin is warm and dry.  ?   Comments: HD catheter ?L hand very mild swelling- ?IV in the R forearm ?Incision covered with ACE wrap, ACE wrap appears clean and dry ?  ?Neurological:  ?   Comments: Intact to light touch in all 4 extremties  ?Psychiatric:  ?   Comments: Normal affect today ? ?Assessment/Plan: ?1. Functional deficits which require 3+ hours per day  of interdisciplinary therapy in a comprehensive inpatient rehab setting. ?Physiatrist is providing close team supervision and 24 hour management of active medical problems listed below. ?Physiatrist and rehab team continue to assess barriers to discharge/monitor patient progress toward functional and medical goals ? ?Care Tool: ? ?Bathing ?   ?Body parts bathed by patient: Left arm, Chest, Abdomen, Right upper leg, Left upper leg, Right lower leg, Left lower leg, Face  ? Body parts bathed by helper: Right arm ?  ?  ?Bathing assist Assist Level: Minimal Assistance - Patient > 75% ?  ?  ?Upper Body Dressing/Undressing ?Upper body dressing   ?What is the patient wearing?: Pull over shirt ?   ?Upper body assist Assist Level: Moderate Assistance - Patient 50 - 74% ?   ?Lower Body Dressing/Undressing ?Lower body dressing ? ? ?   ?What is the patient wearing?: Pants, Incontinence brief ? ?  ? ?Lower body assist Assist for lower body dressing: Moderate Assistance - Patient 50 - 74% ?   ? ?Toileting ?Toileting    ?Toileting assist Assist for toileting: Maximal Assistance - Patient 25 - 49% ?  ?  ?  Transfers ?Chair/bed transfer ? ?Transfers assist ?   ? ?Chair/bed transfer assist level: Contact Guard/Touching assist ?Chair/bed transfer assistive device: Cane (quad cane) ?  ?Locomotion ?Ambulation ? ? ?Ambulation assist ? ?   ? ?Assist level: Contact Guard/Touching assist ?Assistive device: Cane-quad ?Max distance: 68f  ? ?Walk 10 feet activity ? ? ?Assist ?   ? ?Assist level: Minimal Assistance - Patient > 75% ?Assistive device: Cane-quad  ? ?Walk 50 feet activity ? ? ?Assist Walk 50 feet with 2 turns activity did not occur: Safety/medical concerns ? ?  ?   ? ? ?Walk 150 feet activity ? ? ?Assist Walk 150 feet activity did not occur: Safety/medical concerns ? ?  ?  ?  ? ?Walk 10 feet on uneven surface  ?activity ? ? ?Assist Walk 10 feet on uneven surfaces activity did not occur: Safety/medical concerns ? ? ?  ?    ? ?Wheelchair ? ? ? ? ?Assist Is the patient using a wheelchair?: Yes ?Type of Wheelchair: Manual ?  ? ?Wheelchair assist level: Maximal Assistance - Patient 25 - 49% ?Max wheelchair distance: 1568f ? ? ?Wheelchair 50 feet with 2 turns activity ? ? ? ?Assist ? ?  ?  ? ? ?Assist Level: Maximal Assistance - Patient 25 - 49%  ? ?Wheelchair 150 feet activity  ? ? ? ?Assist ?   ? ? ?Assist Level: Maximal Assistance - Patient 25 - 49%  ? ?Blood pressure (!) 113/59, pulse 93, temperature 98.6 ?F (37 ?C), temperature source Oral, resp. rate 16, height '5\' 5"'$  (1.651 m), weight 66.8 kg, SpO2 97 %. ? ? ? Medical Problem List and Plan: ?1. Functional deficits secondary to  L humeral fracture NWB due to ground level fall, s/p ORIF on 4/19 by Dr. HaDoreatha Martin ?            -patient may not shower- HD catheter ?            -ELOS/Goals: 12-14 days- mod I ? Looking at possible SNF if doesn't progress ? Con't CIR_ PT, and OT- likely was high fall risk BEFORE admission ?2.  Antithrombotics: ?-DVT/anticoagulation:  Pharmaceutical: Heparin ?            -antiplatelet therapy: n/a ?3. Postoperative pain from left humerus fracture: con't tylenol and Oxycodone prn. Add vitamin C to help with pain sensitivity ? 4/25- will change Oxycodone to 10 mg q4 hours prn- see if that helps, but monitor constipation, since already an issue. ? 4/26- pt doesn't see any difference in pain, but hasn't had pain meds yet this AM ?4/27-Pt reports PRN oxycodone helping keep pain manageable, will continue ?4. Mood: continue social support and encouragement-  ?            -antipsychotic agents: n/a ?5. Neuropsych: This patient is capable of making decisions on her own behalf, however has STM deficits. ?6. Skin/Wound Care: local wound care for incision and HD catheter care and CHG baths ?7. Fluids/Electrolytes/Nutrition: on HD ?8. ESRD- On HD T/S- per renal ?9. Hard of hearing ?10. DM- on SSI- no oral agents due to DM ?-4/27 mild hypoglycemia, improved with food, has  not had any recent doses of insulin, continue to monitor glucose ?11. HTN- actually been having some orthostatic hypotension- will defer to renal to help - got IV bolus today.  ? 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 = [facility] ?13. Tachycardia- HR running 100-s to  110s at rest- likely due to decreased fluid levels- IV bolus given this AM- labs in AM ? 4/26- HR 80s- doing better ?14. UTI- on Rocephin IV-needs 7 days ?-Rocephin was completed 4/25 ?15. Constipation- LBM 3-4 days ago- will give Sorbitol 15 cc and repeat if needed.  ?16. L humeral fx- NWB on LUE. ?17. Insomnia- on melatonin-  ?18. Nausea/vomiting- has Zofran and Reglan prn- will monitor- - might be due to Constipation- see if improves with BM ? 4/25- likely constipation- working on it.  ? 4/26- Another dose Sorbitol and soap suds enema today ? 4/27- Reports Nausea is improved, does not appear to have required recent use of prn zofran or reglan ?19. Neck pain: add kpad.  ?20. Overweight: BMI 25.09: provide dietary education ?21/. Constipation ? 4/25- will give sorbitol 30 cc at 10 am since has HD today. Also con't Colace 1 tab BID for hard balls. Also added miralax ? 4/26- sorbitol 45cc and soap suds enema given ? 4/27-Consider additional laxative tomorrow if no BM today ?22. Hyperkalemia ? 4/25- K+ 5.3- per renal.  ? 4/26- K 5.7- missed HD yesterday- to get today.  ? 4/27 K  improved to 4.7 today ? ? ? ? ?LOS: ?5 days ?A FACE TO FACE EVALUATION WAS PERFORMED ? ?Jennye Boroughs ?04/06/2022, 7:48 AM  ? ?  ?  ?

## 2022-04-07 LAB — RENAL FUNCTION PANEL
Albumin: 2.6 g/dL — ABNORMAL LOW (ref 3.5–5.0)
Albumin: 2.9 g/dL — ABNORMAL LOW (ref 3.5–5.0)
Anion gap: 10 (ref 5–15)
Anion gap: 13 (ref 5–15)
BUN: 34 mg/dL — ABNORMAL HIGH (ref 8–23)
BUN: 36 mg/dL — ABNORMAL HIGH (ref 8–23)
CO2: 27 mmol/L (ref 22–32)
CO2: 24 mmol/L (ref 22–32)
Calcium: 9.5 mg/dL (ref 8.9–10.3)
Calcium: 9.4 mg/dL (ref 8.9–10.3)
Chloride: 96 mmol/L — ABNORMAL LOW (ref 98–111)
Chloride: 96 mmol/L — ABNORMAL LOW (ref 98–111)
Creatinine, Ser: 4.73 mg/dL — ABNORMAL HIGH (ref 0.44–1.00)
Creatinine, Ser: 4.76 mg/dL — ABNORMAL HIGH (ref 0.44–1.00)
GFR, Estimated: 9 mL/min — ABNORMAL LOW (ref >60)
GFR, Estimated: 9 mL/min — ABNORMAL LOW (ref >60)
Glucose, Bld: 87 mg/dL (ref 70–99)
Glucose, Bld: 95 mg/dL (ref 70–99)
Phosphorus: 6.2 mg/dL — ABNORMAL HIGH (ref 2.5–4.6)
Phosphorus: 6.3 mg/dL — ABNORMAL HIGH (ref 2.5–4.6)
Potassium: 6 mmol/L — ABNORMAL HIGH (ref 3.5–5.1)
Potassium: 4.7 mmol/L (ref 3.5–5.1)
Sodium: 133 mmol/L — ABNORMAL LOW (ref 135–145)
Sodium: 133 mmol/L — ABNORMAL LOW (ref 135–145)

## 2022-04-07 LAB — GLUCOSE, CAPILLARY
Glucose-Capillary: 81 mg/dL (ref 70–99)
Glucose-Capillary: 81 mg/dL (ref 70–99)
Glucose-Capillary: 104 mg/dL — ABNORMAL HIGH (ref 70–99)
Glucose-Capillary: 96 mg/dL (ref 70–99)

## 2022-04-07 MED ORDER — OXYCODONE HCL 10 MG PO TABS: 10.0000 mg | ORAL_TABLET | ORAL | 0 refills | Status: DC | PRN

## 2022-04-07 MED ORDER — SODIUM ZIRCONIUM CYCLOSILICATE 10 G PO PACK
10.0000 g | PACK | Freq: Once | ORAL | Status: AC
  Administered 2022-04-07: 10 g via ORAL
  Filled 2022-04-07: qty 1

## 2022-04-07 MED ORDER — POLYETHYLENE GLYCOL 3350 17 G PO PACK: 17.0000 g | PACK | Freq: Every day | ORAL | 0 refills | Status: DC

## 2022-04-07 MED ORDER — ACETAMINOPHEN 325 MG PO TABS: 650.0000 mg | ORAL_TABLET | Freq: Four times a day (QID) | ORAL | Status: DC

## 2022-04-07 MED ORDER — SORBITOL 70 % SOLN
30.0000 mL | Freq: Once | Status: AC
  Administered 2022-04-07: 30 mL via ORAL
  Filled 2022-04-07: qty 30

## 2022-04-07 MED ORDER — ASCORBIC ACID 250 MG PO TABS
250.0000 mg | ORAL_TABLET | Freq: Every day | ORAL | Status: AC
Start: 2022-04-08 — End: ?

## 2022-04-07 NOTE — Progress Notes (Signed)
Physical Therapy Discharge Summary ? ?Patient Details  ?Name: Christina Mercado ?MRN: [id number] ?Date of Birth: October 07, 1936 ? ?Patient has met 9 of 10 long term goals due to improved activity tolerance, improved balance, improved postural control, increased strength, increased range of motion, decreased pain, and ability to compensate for deficits.  Patient to discharge at an ambulatory level  CGA .   Patient's care partner unavailable to provide the necessary physical and cognitive assistance at discharge. ? ?Reasons goals not met: Pt did not meet car transfer goal of CGA as she refused to perform on grad day due to fatigue. Pt was rated as mod A at last scoring of her performance of this transfer. ? ?Recommendation:  ?Patient will benefit from ongoing skilled PT services in skilled nursing facility setting to continue to advance safe functional mobility, address ongoing impairments in balance, coordination, endurance, and minimize fall risk. ? ?Equipment: ?No equipment provided ? ?Reasons for discharge: treatment goals met and discharge from hospital ? ?Patient/family agrees with progress made and goals achieved: Yes ? ?PT Discharge ?Precautions/Restrictions ?Precautions ?Precautions: Fall ?Precaution Comments: LUE NWB ?Required Braces or Orthoses: Sling ?Restrictions ?Weight Bearing Restrictions: Yes ?LUE Weight Bearing: Non weight bearing ?Pain Interference ?Pain Effect on Sleep: 1. Rarely or not at all ?Pain Interference with Therapy Activities: 1. Rarely or not at all ?Pain Interference with Day-to-Day Activities: 1. Rarely or not at all ?Vision/Perception  ?Vision - History ?Ability to See in Adequate Light: 0 Adequate ?Perception ?Perception: Within Functional Limits ?Praxis ?Praxis: Intact  ?Cognition ?Overall Cognitive Status: No family/caregiver present to determine baseline cognitive functioning ?Arousal/Alertness: Awake/alert ?Orientation Level: Oriented X4 ?Memory: Impaired ?Awareness: Impaired ?Problem  Solving: Impaired ?Safety/Judgment: Impaired ?Sensation ?Sensation ?Light Touch: Appears Intact ?Hot/Cold: Appears Intact ?Proprioception: Appears Intact ?Stereognosis: Appears Intact ?Coordination ?Gross Motor Movements are Fluid and Coordinated: No ?Fine Motor Movements are Fluid and Coordinated: No ?Coordination and Movement Description: limited coordination 2/2 LUE NWB and ACE wrap, edema in L digits ?Finger Nose Finger Test: unable to complete 2/2 LUE in ace wrap and NWB/painful ?Heel Shin Test: [facility] ?Motor  ?Motor ?Motor: Abnormal postural alignment and control;Other (comment) ?Motor - Discharge Observations: general deconditioning  ?Mobility ?Bed Mobility ?Bed Mobility: Rolling Right;Supine to Sit;Sit to Supine;Rolling Left ?Rolling Right: Contact Guard/Touching assist ?Rolling Left: Contact Guard/Touching assist ?Supine to Sit: Contact Guard/Touching assist ?Sit to Supine: Contact Guard/Touching assist ?Transfers ?Transfers: Sit to Stand;Stand to [employer] Transfers ?Sit to Stand: Contact Guard/Touching assist ?Stand to Sit: Contact Guard/Touching assist ?Stand Pivot Transfers: Contact Guard/Touching assist ?Transfer (Assistive device): Large base quad cane ?Locomotion  ?Gait ?Ambulation: Yes ?Gait Assistance: Contact Guard/Touching assist ?Gait Distance (Feet): 50 Feet ?Assistive device: Large base quad cane ?Gait Assistance Details: Verbal cues for precautions/safety;Verbal cues for gait pattern;Verbal cues for technique;Verbal cues for sequencing;Verbal cues for safe use of DME/AE;Manual facilitation for weight shifting;Tactile cues for posture ?Gait ?Gait: Yes ?Gait Pattern: Impaired ?Gait Pattern: Step-to pattern;Decreased stride length;Decreased stance time - left;Decreased hip/knee flexion - left;Antalgic;Lateral trunk lean to right;Narrow base of support;Trunk rotated posteriorly on right;Poor foot clearance - left;Poor foot clearance - right ?Stairs / Additional Locomotion ?Stairs:  No ?Wheelchair Mobility ?Wheelchair Mobility: Yes ?Wheelchair Assistance: Insurance account manager: Both lower extermities;Right upper extremity ?Wheelchair Parts Management: Needs assistance ?Distance: 75  ?Trunk/Postural Assessment  ?Cervical Assessment ?Cervical Assessment: Exceptions to [facility] ?Thoracic Assessment ?Thoracic Assessment: Exceptions to [facility] ?Lumbar Assessment ?Lumbar Assessment: Exceptions to [facility] ?Postural Control ?Postural Control: Deficits on evaluation  ?Balance ?Balance ?Balance Assessed: Yes ?Standardized  Balance Assessment ?Standardized Balance Assessment: Berg Balance Test ?Merrilee Jansky Balance Test ?Sit to Stand: Needs minimal aid to stand or to stabilize ?Standing Unsupported: Unable to stand 30 seconds unassisted ?Sitting with Back Unsupported but Feet Supported on Floor or Stool: Able to sit 2 minutes under supervision ?Stand to Sit: Sits independently, has uncontrolled descent ?Transfers: Needs one person to assist ?Standing Unsupported with Eyes Closed: Needs help to keep from falling ?Standing Ubsupported with Feet Together: Needs help to attain position and unable to hold for 15 seconds ?From Standing, Reach Forward with Outstretched Arm: Loses balance while trying/requires external support ?From Standing Position, Pick up Object from Floor: Unable to try/needs assist to keep balance ?From Standing Position, Turn to Look Behind Over each Shoulder: Needs assist to keep from losing balance and falling ?Turn 360 Degrees: Needs assistance while turning ?Standing Unsupported, Alternately Place Feet on Step/Stool: Needs assistance to keep from falling or unable to try ?Standing Unsupported, One Foot in Front: Loses balance while stepping or standing ?Standing on One Leg: Unable to try or needs assist to prevent fall ?Total Score: 6 ?Static Sitting Balance ?Static Sitting - Balance Support: Feet unsupported ?Static Sitting - Level of Assistance: 5: Stand by  assistance ?Dynamic Sitting Balance ?Dynamic Sitting - Balance Support: During functional activity;Right upper extremity supported ?Dynamic Sitting - Level of Assistance: 5: Stand by assistance ?Static Standing Balance ?Static Standing - Balance Support: Right upper extremity supported ?Static Standing - Level of Assistance:  (cga) ?Dynamic Standing Balance ?Dynamic Standing - Balance Support: Right upper extremity supported ?Dynamic Standing - Level of Assistance:  (cga) ?Extremity Assessment  ?RUE Assessment ?RUE Assessment: Within Functional Limits ?LUE Assessment ?Active Range of Motion (AROM) Comments: able to tolerate 40 degrees of elbow flex.  limited by ACE wrap dressing (per ortho - ROM at elbow is ok) ?General Strength Comments: NWB due to distal humeral fracture ?RLE Assessment ?RLE Assessment: Exceptions to [facility] ?General Strength Comments: Grossly 4-/5 ?LLE Assessment ?LLE Assessment: Exceptions to [facility] ?General Strength Comments: Grossly 4-/5 ? ? ? ? ?Excell Seltzer, PT, DPT, CSRS ?Debbora Dus, PT, DPT, CBIS ? ?04/07/2022, 10:40 AM ?

## 2022-04-07 NOTE — Progress Notes (Signed)
?                                                       PROGRESS NOTE ? ? ?Subjective/Complaints: ?Denies any acute events overnight. Reports continue pain but it is well controlled with current medication. She does not think she has had a BM in the last few days. No concerns at this time. She is working with OT. ? ? ?ROS:  ? ?Pt denies CP, SOB, Nausea, Abdominal pain, Vomiting, HA ? ? ?Objective: ?  ?No results found. ?No results for input(s): WBC, HGB, HCT, PLT in the last 72 hours. ? ?Recent Labs  ?  04/06/22 ?3664 04/07/22 ?0501  ?NA 134* 133*  ?K 4.7 6.0*  ?CL 100 96*  ?CO2 27 27  ?GLUCOSE 83 87  ?BUN 22 34*  ?CREATININE 3.47* 4.73*  ?CALCIUM 9.1 9.5  ? ? ?Intake/Output Summary (Last 24 hours) at 04/07/2022 0839 ?Last data filed at 04/06/2022 1834 ?Gross per 24 hour  ?Intake 480 ml  ?Output --  ?Net 480 ml  ?  ? ?  ? ?Physical Exam: ?Vital Signs ?Blood pressure (!) 143/85, pulse 80, temperature 98.6 ?F (37 ?C), temperature source Oral, resp. rate 18, height '5\' 5"'$  (1.651 m), weight 68.7 kg, SpO2 93 %. ? ?Constitutional: No distress . Vital signs reviewed. ?HEENT: NCAT, EOMI, oral membranes moist ?Neck: supple ?Cardiovascular: RRR no MRG. No JVD    ?Respiratory/Chest: CTA Bilaterally without wheezes or rales. Normal effort    ?GI/Abdomen: BS +, non-tender, non-distended, + BS ?Ext: no clubbing, cyanosis ?Psych: very pleasant and cooperative, alert ? ?Musculoskeletal:  ?   Comments: UE strength on RUE 5-/5 ?LUE- grip 5-/5 otherwise not tested ?Can lift against gravity ? ?Skin: ?   General: Skin is warm and dry.  ?   Comments: HD catheter ?L hand very mild swelling- improved ?IV in the R forearm ?Incision covered with ACE wrap, ACE wrap appears clean and dry ?  ?Neurological:  ?   Comments: Intact to light touch in all 4 extremties  ?Psychiatric:  ?   Comments: Normal affect ? ?Assessment/Plan: ?1. Functional deficits which require 3+ hours per day of interdisciplinary therapy in a comprehensive inpatient rehab  setting. ?Physiatrist is providing close team supervision and 24 hour management of active medical problems listed below. ?Physiatrist and rehab team continue to assess barriers to discharge/monitor patient progress toward functional and medical goals ? ?Care Tool: ? ?Bathing ?   ?Body parts bathed by patient: Left arm, Chest, Abdomen, Right upper leg, Left upper leg, Right lower leg, Left lower leg, Face  ? Body parts bathed by helper: Right arm ?  ?  ?Bathing assist Assist Level: Minimal Assistance - Patient > 75% ?  ?  ?Upper Body Dressing/Undressing ?Upper body dressing   ?What is the patient wearing?: Pull over shirt ?   ?Upper body assist Assist Level: Moderate Assistance - Patient 50 - 74% ?   ?Lower Body Dressing/Undressing ?Lower body dressing ? ? ?   ?What is the patient wearing?: Pants, Incontinence brief ? ?  ? ?Lower body assist Assist for lower body dressing: Moderate Assistance - Patient 50 - 74% ?   ? ?Toileting ?Toileting    ?Toileting assist Assist for toileting: Maximal Assistance - Patient 25 - 49% ?  ?  ?Transfers ?Chair/bed transfer ? ?  Transfers assist ?   ? ?Chair/bed transfer assist level: Contact Guard/Touching assist ?Chair/bed transfer assistive device: Cane (quad cane) ?  ?Locomotion ?Ambulation ? ? ?Ambulation assist ? ?   ? ?Assist level: Contact Guard/Touching assist ?Assistive device: Cane-quad ?Max distance: 88f  ? ?Walk 10 feet activity ? ? ?Assist ?   ? ?Assist level: Minimal Assistance - Patient > 75% ?Assistive device: Cane-quad  ? ?Walk 50 feet activity ? ? ?Assist Walk 50 feet with 2 turns activity did not occur: Safety/medical concerns ? ?  ?   ? ? ?Walk 150 feet activity ? ? ?Assist Walk 150 feet activity did not occur: Safety/medical concerns ? ?  ?  ?  ? ?Walk 10 feet on uneven surface  ?activity ? ? ?Assist Walk 10 feet on uneven surfaces activity did not occur: Safety/medical concerns ? ? ?  ?   ? ?Wheelchair ? ? ? ? ?Assist Is the patient using a wheelchair?: Yes ?Type  of Wheelchair: Manual ?  ? ?Wheelchair assist level: Maximal Assistance - Patient 25 - 49% ?Max wheelchair distance: 156f ? ? ?Wheelchair 50 feet with 2 turns activity ? ? ? ?Assist ? ?  ?  ? ? ?Assist Level: Maximal Assistance - Patient 25 - 49%  ? ?Wheelchair 150 feet activity  ? ? ? ?Assist ?   ? ? ?Assist Level: Maximal Assistance - Patient 25 - 49%  ? ?Blood pressure (!) 143/85, pulse 80, temperature 98.6 ?F (37 ?C), temperature source Oral, resp. rate 18, height '5\' 5"'$  (1.651 m), weight 68.7 kg, SpO2 93 %. ? ? ? Medical Problem List and Plan: ?1. Functional deficits secondary to  L humeral fracture NWB due to ground level fall, s/p ORIF on 4/19 by Dr. HaDoreatha Martin ?            -patient may not shower- HD catheter ?            -ELOS/Goals: 12-14 days- mod I ? -SNF planned for monday ? -Con't CIR_ PT, and OT- likely was high fall risk BEFORE admission ?2.  Antithrombotics: ?-DVT/anticoagulation:  Pharmaceutical: Heparin ?            -antiplatelet therapy: n/a ?3. Postoperative pain from left humerus fracture: con't tylenol and Oxycodone prn. Add vitamin C to help with pain sensitivity ? 4/25- will change Oxycodone to 10 mg q4 hours prn- see if that helps, but monitor constipation, since already an issue. ? 4/26- pt doesn't see any difference in pain, but hasn't had pain meds yet this AM ?4/27-Pt reports PRN oxycodone helping keep pain manageable, will continue ?4. Mood: continue social support and encouragement-  ?            -antipsychotic agents: n/a ?5. Neuropsych: This patient is capable of making decisions on her own behalf, however has STM deficits. ?6. Skin/Wound Care: local wound care for incision and HD catheter care and CHG baths ?7. Fluids/Electrolytes/Nutrition: on HD ?8. ESRD- On HD T/S- per renal ?9. Hard of hearing ?10. DM- on SSI- no oral agents due to DM ?-4/27 mild hypoglycemia, improved with food, has not had any recent doses of insulin, continue to monitor glucose ?-4/28 CBG overall well  controlled, continue to monitor ?11. HTN- actually been having some orthostatic hypotension- will defer to renal to help - got IV bolus today.  ? 4/26- soft, but asymptomatic- con't regimen ? 4/28 - well controlled overall, continue hydralazine and amlodipine  ?12. Light chain deposition disease- on Nintero and  Decadron. seen at [facility]  ?13. Tachycardia- HR running 100-s to 110s at rest- likely due to decreased fluid levels- IV bolus given this AM- labs in AM ? 4/26- HR 80s- doing better ?14. UTI- on Rocephin IV-needs 7 days ?-Rocephin was completed 4/25 ?15. Constipation- LBM 3-4 days ago- will give Sorbitol 15 cc and repeat if needed.  ?16. L humeral fx- NWB on LUE. ?17. Insomnia- on melatonin-  ?18. Nausea/vomiting- has Zofran and Reglan prn- will monitor- - might be due to Constipation- see if improves with BM ? 4/25- likely constipation- working on it.  ? 4/26- Another dose Sorbitol and soap suds enema today ?4/27- Reports Nausea is improved, does not appear to have required recent use of prn zofran or reglan ?4/28- Order Sorbitol and soap suds enema ?19. Neck pain: add kpad.  ?20. Overweight: BMI 25.09: provide dietary education ?21/. Constipation ? 4/25- will give sorbitol 30 cc at 10 am since has HD today. Also con't Colace 1 tab BID for hard balls. Also added miralax ? 4/26- sorbitol 45cc and soap suds enema given ? 4/27-Consider additional laxative tomorrow if no BM today ?22. Hyperkalemia ? 4/25- K+ 5.3- per renal.  ? 4/26- K 5.7- missed HD yesterday- to get today.  ? 4/27 K  improved to 4.7 today ? 4/28 K 6.0 today, per renal ? ?LOS: ?6 days ?A FACE TO FACE EVALUATION WAS PERFORMED ? ?Jennye Boroughs ?04/07/2022, 8:39 AM  ? ?  ?  ?

## 2022-04-07 NOTE — Discharge Summary (Signed)
Physician Discharge Summary  ?Patient ID: ?Christina Mercado ?MRN: [id number] ?DOB/AGE: 1936-08-07 86 y.o. ? ?Admit date: 04/01/2022 ?Discharge date: 04/10/2022 ? ?Discharge Diagnoses:  ?Principal Problem: ?  Left humeral fracture ?DVT prophylaxis ?End-stage renal disease with hemodialysis ?Hard of hearing ?Diabetes mellitus ?Hypertension ?UTI ?Constipation ?Light chain deposition disease ?Hypothyroidism ? ?Discharged Condition: Stable ? ?Significant Diagnostic Studies: ?DG Elbow 2 Views Left ? ?Result Date: 03/29/2022 ?CLINICAL DATA:  Fracture, postoperative EXAM: LEFT ELBOW - 2 VIEW COMPARISON:  03/28/2022 FINDINGS: Status post interval plate and screw fixation of a previously noted left distal humerus fracture. The fracture components appear to be in near anatomic alignment. No perihardware lucency hardware fracture, or perihardware fracture. Air within the soft tissues is not unexpected post surgically. IMPRESSION: Expected postoperative appearance, status post ORIF of a left distal humerus fracture. Electronically Signed   By: Merilyn Baba M.D.   On: 03/29/2022 13:57  ? ?DG ELBOW COMPLETE LEFT (3+VIEW) ? ?Result Date: 03/29/2022 ?CLINICAL DATA:  Intraoperative imaging of fracture fixation EXAM: LEFT ELBOW - COMPLETE 3+ VIEW COMPARISON:  Yesterday FINDINGS: Multiple intraoperative images which demonstrate placement of 2 plate and screw fixation devices across the previously described distal humerus fracture. Alignment is significantly improved. One of the plates is identified medially i and n the other is identified posteriorly. IMPRESSION: Intraoperative imaging of distal humerus fixation. Electronically Signed   By: Abigail Miyamoto M.D.   On: 03/29/2022 12:38  ? ?DG Elbow Complete Left ? ?Result Date: 03/28/2022 ?CLINICAL DATA:  Fall. EXAM: LEFT ELBOW - COMPLETE 3+ VIEW COMPARISON:  None. FINDINGS: There is a displaced supracondylar fracture with probable impaction. There is medial displacement of the distal fracture  fragment. No dislocation. The bones are osteopenic. There is joint effusion. The soft tissue swelling of the elbow. IMPRESSION: Displaced supracondylar fracture. Electronically Signed   By: Anner Crete M.D.   On: 03/28/2022 00:42  ? ?CT HEAD WO CONTRAST (5MM) ? ?Result Date: 03/28/2022 ?CLINICAL DATA:  Trauma. EXAM: CT HEAD WITHOUT CONTRAST CT CERVICAL SPINE WITHOUT CONTRAST TECHNIQUE: Multidetector CT imaging of the head and cervical spine was performed following the standard protocol without intravenous contrast. Multiplanar CT image reconstructions of the cervical spine were also generated. RADIATION DOSE REDUCTION: This exam was performed according to the departmental dose-optimization program which includes automated exposure control, adjustment of the mA and/or kV according to patient size and/or use of iterative reconstruction technique. COMPARISON:  Head CT dated 02/26/2019. FINDINGS: CT HEAD FINDINGS Brain: Mild age-related atrophy and chronic microvascular ischemic changes. There is no acute intracranial hemorrhage. No mass effect or midline shift. No extra-axial fluid collection. Vascular: No hyperdense vessel or unexpected calcification. Skull: Normal. Negative for fracture or focal lesion. Sinuses/Orbits: No acute finding. Other: None CT CERVICAL SPINE FINDINGS Alignment: No acute subluxation. There is straightening of normal cervical lordosis which may be positional or due to muscle spasm. Grade 1 C4-C5 anterolisthesis. Skull base and vertebrae: No acute fracture. Soft tissues and spinal canal: No prevertebral fluid or swelling. No visible canal hematoma. Disc levels:  Multilevel degenerative changes. Upper chest: Negative. Other: Bilateral carotid bulb calcified plaques. IMPRESSION: 1. No acute intracranial pathology. Mild age-related atrophy and chronic microvascular ischemic changes. 2. No acute cervical spine fracture or subluxation. Multilevel degenerative changes. Electronically Signed   By:  Anner Crete M.D.   On: 03/28/2022 01:03  ? ?CT Cervical Spine Wo Contrast ? ?Result Date: 03/28/2022 ?CLINICAL DATA:  Trauma. EXAM: CT HEAD WITHOUT CONTRAST CT CERVICAL SPINE WITHOUT CONTRAST TECHNIQUE: Multidetector CT  imaging of the head and cervical spine was performed following the standard protocol without intravenous contrast. Multiplanar CT image reconstructions of the cervical spine were also generated. RADIATION DOSE REDUCTION: This exam was performed according to the departmental dose-optimization program which includes automated exposure control, adjustment of the mA and/or kV according to patient size and/or use of iterative reconstruction technique. COMPARISON:  Head CT dated 02/26/2019. FINDINGS: CT HEAD FINDINGS Brain: Mild age-related atrophy and chronic microvascular ischemic changes. There is no acute intracranial hemorrhage. No mass effect or midline shift. No extra-axial fluid collection. Vascular: No hyperdense vessel or unexpected calcification. Skull: Normal. Negative for fracture or focal lesion. Sinuses/Orbits: No acute finding. Other: None CT CERVICAL SPINE FINDINGS Alignment: No acute subluxation. There is straightening of normal cervical lordosis which may be positional or due to muscle spasm. Grade 1 C4-C5 anterolisthesis. Skull base and vertebrae: No acute fracture. Soft tissues and spinal canal: No prevertebral fluid or swelling. No visible canal hematoma. Disc levels:  Multilevel degenerative changes. Upper chest: Negative. Other: Bilateral carotid bulb calcified plaques. IMPRESSION: 1. No acute intracranial pathology. Mild age-related atrophy and chronic microvascular ischemic changes. 2. No acute cervical spine fracture or subluxation. Multilevel degenerative changes. Electronically Signed   By: Anner Crete M.D.   On: 03/28/2022 01:03  ? ?PERIPHERAL VASCULAR CATHETERIZATION ? ?Result Date: 03/28/2022 ?See surgical note for result. ? ?DG C-Arm 1-60 Min-No Report ? ?Result  Date: 03/29/2022 ?Fluoroscopy was utilized by the requesting physician.  No radiographic interpretation.   ? ?Labs:  ?Basic Metabolic Panel: ?Recent Labs  ?Lab 04/03/22 ?6789 04/04/22 ?3810 04/05/22 ?0606 04/06/22 ?1751 04/07/22 ?0501 04/07/22 ?1458 04/08/22 ?0258 04/09/22 ?5277  ?NA 132* 133* 131* 134* 133* 133* 132* 131*  ?K 5.1 5.3* 5.7* 4.7 6.0* 4.7 5.0 3.8  ?CL 97* 97* 98 100 96* 96* 97* 95*  ?CO2 '25 25 22 27 27 24 '$ 21* 24  ?GLUCOSE 89 110* 86 83 87 95 85 77  ?BUN 53* 64* 70* 22 34* 36* 41* 20  ?CREATININE 5.09* 6.06* 6.37* 3.47* 4.73* 4.76* 5.13* 3.43*  ?CALCIUM 8.9 9.1 8.9 9.1 9.5 9.4 9.1 8.8*  ?PHOS 7.0* 7.6* 7.7* 5.3* 6.2* 6.3* 6.4* 4.7*  ? ? ?CBC: ?Recent Labs  ?Lab 04/08/22 ?1430  ?WBC 9.6  ?NEUTROABS 7.3  ?HGB 10.1*  ?HCT 30.7*  ?MCV 95.6  ?PLT 317  ? ? ?CBG: ?Recent Labs  ?Lab 04/08/22 ?1954 04/09/22 ?8242 04/09/22 ?1200 04/09/22 ?1653 04/09/22 ?2059  ?GLUCAP 79 75 83 99 113*  ? ?Family history.  Positive for hypertension as well as hyperlipidemia.  Denies any colon cancer esophageal cancer or rectal cancer ? ?Brief HPI:   Christina Mercado is a 86 y.o. right-handed female with history of hypertension diabetes mellitus end-stage renal disease with hemodialysis.  Presented after ground-level fall 03/28/2022 to [facility] transferred to [facility] by Dr. Doreatha Martin found to have distal left humeral supracondylar elbow fracture.  Underwent ORIF by Dr. Doreatha Martin 03/29/2022 after transfer to [facility].  She also had a fistula in the left upper extremity near fracture and since would not be able to use hemodialysis she also had a hemodialysis catheter placed for hemodialysis.  Nonweightbearing left upper extremity per orthopedic services.  Therapy evaluations completed due to patient decreased functional mobility was admitted for a comprehensive rehab program. ? ? ?Hospital Course: Christina Mercado was admitted to rehab 04/01/2022 for inpatient therapies to consist of PT, ST and OT at least three  hours five days a week. Past admission  physiatrist, therapy team and rehab RN have worked together to provide customized collaborative inpatient rehab.  Pertaining to patient's left humeral fracture status post

## 2022-04-07 NOTE — Progress Notes (Signed)
Physical Therapy Session Note ? ?Patient Details  ?Name: Christina Mercado ?MRN: [id number] ?Date of Birth: 1936-11-28 ? ?Today's Date: 04/07/2022 ?PT Individual Time: 1000-1041 ?PT Individual Time Calculation (min): 41 min  ? ?Short Term Goals: ?Week 1:  PT Short Term Goal 1 (Week 1): Pt will complete bed mobility with minA ?PT Short Term Goal 2 (Week 1): Pt will complete bed<>chair transfers with minA and LRAD ?PT Short Term Goal 3 (Week 1): Pt will ambulate 45f with minA and LRAD ?PT Short Term Goal 4 (Week 1): Pt will improve BERG balance score by at least 7 points to indicate improved balance. ? ?Skilled Therapeutic Interventions/Progress Updates:  ?  Patient received sitting up in wc, agreeable to PT. She reports pain in L UE, did not rate, bu premedicated. PT providing rest breaks, distractions and repositioning to assist with pain management. PT transporting patient in wc to therapy gym for time management and energy conservation. Discussed with patient and SW planned d/c to SNF on Monday. Patient agreeable. She was able to ambulate 55fx2 with [facility]. Short, suffling steps with variable BOS, flexed posture. Patient requiring consistent cuing for sequencing, especially with turns and when sitting. Initially Max cues and hand over hand instruction needed for wc mobility progressing to CGTimberonith intermittent verbal cues [id number] B LE and R UE. Patient with short distance ambulatory transfer to recliner. Seatbelt alarm on, call light within reach.  ? ?Therapy Documentation ?Precautions:  ?Precautions ?Precautions: Fall ?Precaution Comments: LUE NWB ?Restrictions ?Weight Bearing Restrictions: Yes ?LUE Weight Bearing: Non weight bearing ? ? ? ? ?Therapy/Group: Individual Therapy ? ?JenDebbora DusenDebbora DusT, DPT, CBIS ? ?04/07/2022, 7:35 AM  ?

## 2022-04-07 NOTE — Progress Notes (Signed)
They have with her ?[facility] ?Progress Note  ? ?86 y.o. 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 She usually dialyzes Tues and Thur with [facility] [place] [facility]. Unfortunately she tripped and fell sustaining a supracondylar distal humerus fracture s/p ORIF on 4/19. She actually had a RIJ TC placed by [facility] as the left upper arm access would not be accessible with the fracture + ORIF.  ? ?Editor, commissioning on [place] ?Tues Sat 3hr 15 min EDW 64kg ?Venofer 50qw  Mircera 50 q4 (last 4/11) ?Calcitriol 0.25 BIW ?H1300 bolus 600/h ? ?Assessment/ Plan:   ?ESRD - usually dialyzed Tues and Sat at [facility] on [address] last HD on Tuesday.  ?Tues/Sat schedule here ?3h77mn, 2K, 400/600; No heparin; 2-3L UF ?R IJ TDC, has AVF but not accessible during injury/sling, remains patent ? ?Left elbow fracture - appreciate vascular placing a RIJ TC and will need to wait till left arm is accessible before attempting dialysis through [facility]. S/p ORIF left supracondylar humerus fracture on 4/19.  ?Renal osteodystrophy - phos -> up/down on renvela 1 tab qAC, CTM ?Anemia - TSAT 12%; iv iron on dialysis; not due for her ESA yet (last given 4/11).  On Mircera as outpatient. 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  [facility]?Hyperkalemia - outlier this AM.  Give lokelma 10g.  On renal diet.   ? ?Subjective:   ?Seen in room ?No c/o; working with therapy ?Positive attitude ?For HD tomorrow  ? ?Objective:   ?BP (!) 143/85 (BP Location: Right Arm)   Pulse 80   Temp 98.6 ?F (37 ?C) (Oral)   Resp 18   Ht '5\' 5"'$  (1.651 m)   Wt 68.7 kg   SpO2 93%   BMI 25.20 kg/m?  ? ?Intake/Output Summary (Last 24 hours) at 04/07/2022 1004 ?Last data filed at 04/06/2022 1834 ?Gross per 24 hour  ?Intake 480 ml  ?Output --  ?Net 480 ml  ? ? ?Weight change: 0.8 kg ? ?Physical Exam: ?GEN: NAD, A&Ox3, NCAT, pleasant,  very hard of hearing, in chair ?HEENT: EOMI ?NECK: Supple ?LUNGS: CTA B/L no rales, rhonchi or wheezing ?CV: RRR, No M/R/G ?ABD: SNDNT +BS  ?EXT: No lower extremity edema ?ACCESS: lt arm + bruit even thru bandages, RIJ TC ? ? ? ?Imaging: ?No results found. ? ?Labs: ?BMET ?Recent Labs  ?Lab 04/01/22 ?1543 04/02/22 ?0161004/23/23 ?1529 04/03/22 ?0960404/25/23 ?0540904/26/23 ?0606 04/06/22 ?0811904/28/23 ?0501  ?NA 137 136 136 132* 133* 131* 134* 133*  ?K 3.7 5.4* 4.4 5.1 5.3* 5.7* 4.7 6.0*  ?CL 97* 100 94* 97* 97* 98 100 96*  ?CO2 '24 26 29 25 25 22 27 27  '$ ?GLUCOSE 212* 91 145* 89 110* 86 83 87  ?BUN 30* 36* 46* 53* 64* 70* 22 34*  ?CREATININE 7.47* 4.16* 4.72* 5.09* 6.06* 6.37* 3.47* 4.73*  ?CALCIUM 11.4* 9.3 9.4 8.9 9.1 8.9 9.1 9.5  ?PHOS 3.8 5.6*  --  7.0* 7.6* 7.7* 5.3* 6.2*  ? ? ?CBC ?Recent Labs  ?Lab 04/01/22 ?0231 04/02/22 ?01478 ?WBC 9.5 7.2  ?NEUTROABS  --  4.3  ?HGB 8.5* 9.8*  ?HCT 27.5* 30.6*  ?MCV 96.5 97.1  ?PLT 190 190  ? ? ? ?Medications:   ? ? acetaminophen  650 mg Oral Q6H  ? amLODipine  2.5 mg Oral Daily  ? vitamin C  250 mg Oral Daily  ? atorvastatin  40 mg Oral  Daily  ? Chlorhexidine Gluconate Cloth  6 each Topical BID  ? cholecalciferol  2,000 Units Oral Daily  ? docusate sodium  100 mg Oral BID  ? DULoxetine  60 mg Oral Daily  ? heparin injection (subcutaneous)  5,000 Units Subcutaneous Q8H  ? insulin aspart  0-6 Units Subcutaneous TID AC & HS  ? levothyroxine  150 mcg Oral Q0600  ? polyethylene glycol  17 g Oral Daily  ? sevelamer carbonate  800 mg Oral TID WC  ? ?Justin Mend, MD  ?04/07/2022, 10:04 AM  ? ?

## 2022-04-07 NOTE — Progress Notes (Signed)
Occupational Therapy Discharge Summary ? ?Patient Details  ?Name: Christina Mercado ?MRN: [id number] ?Date of Birth: 07/25/1936 ? ? ?Patient has met 9 of 9 long term goals due to improved activity tolerance and ability to compensate for deficits.  Patient to discharge at [facility] Assist level.  Patient's care partner unavailable to provide the necessary physical assistance at discharge.   ? ?Reasons goals not met: n/a ? ?Recommendation:  ?Patient will benefit from ongoing skilled OT services in skilled nursing facility setting to continue to advance functional skills in the area of BADL and progress LUE strength and shoulder ROM. Pt is NWB currently LUE, however she is allowed full ROM of shoulder, elbow, forearm, wrist, and hand.  No resistive lifting, pushing, pulling with LUE until cleared by MD. ? ?Equipment: ?No equipment provided ? ?Reasons for discharge: treatment goals met ? ?Patient/family agrees with progress made and goals achieved: Yes ? ?OT Discharge ?Precautions/Restrictions  ?Restrictions ?Weight Bearing Restrictions: Yes ?LUE Weight Bearing: Non weight bearing ? ? ?ADL ?ADL ?Eating: Modified independent ?Grooming: Setup ?Upper Body Bathing: Minimal assistance ?Lower Body Bathing: Moderate assistance ?Upper Body Dressing: Minimal assistance ?Lower Body Dressing: Moderate assistance ?Toileting: Minimal assistance ?Where Assessed-Toileting: Toilet ?Toilet Transfer: Minimal assistance ?Toilet Transfer Method: Ambulating ?Toilet Transfer Equipment: Raised toilet seat ?Vision ?Baseline Vision/History: 1 Wears glasses ?Patient Visual Report: No change from baseline ?Vision Assessment?: No apparent visual deficits ?Perception  ?Perception: Within Functional Limits ?Praxis ?Praxis: Intact ?Cognition ?Cognition ?Overall Cognitive Status: History of cognitive impairments - at baseline ?Arousal/Alertness: Awake/alert ?Orientation Level: Person;Situation;Place ?Person: Oriented ?Place: Oriented ?Situation:  Oriented ?Memory: Impaired ?Memory Impairment: Storage deficit;Retrieval deficit;Decreased short term memory ?Awareness: Impaired ?Awareness Impairment: Emergent impairment;Anticipatory impairment ?Problem Solving Impairment: Verbal complex;Functional complex ?Safety/Judgment: Impaired ?Brief Interview for Mental Status (BIMS) ?Repetition of Three Words (First Attempt): 3 ?Temporal Orientation: Correct ?Temporal Orientation: Month: Accurate within 5 days ?Temporal Orientation: Day: Correct ?Recall: "Sock": Yes, no cue required ?Recall: "Blue": Yes, no cue required ?Recall: "Bed": Yes, no cue required ?BIMS Summary Score: 15 ?Sensation ?Sensation ?Light Touch: Appears Intact ?Hot/Cold: Appears Intact ?Proprioception: Appears Intact ?Stereognosis: Appears Intact ?Coordination ?Gross Motor Movements are Fluid and Coordinated: No ?Fine Motor Movements are Fluid and Coordinated: No ?Finger Nose Finger Test: unable to complete 2/2 LUE in ace wrap and NWB/painful ?Motor  ?Motor ?Motor: Abnormal postural alignment and control;Other (comment) ?Mobility  ?Transfers ?Stand to Sit: supervision ?Trunk/Postural Assessment  ?Thoracic Assessment ?Thoracic Assessment: Exceptions to [facility] (kyphosis) ?Lumbar Assessment ?Lumbar Assessment: Exceptions to [facility] (scoliosis) ?Postural Control ?Postural Control: Deficits on evaluation  ?Balance ?Static Sitting Balance ?Static Sitting - Level of Assistance: 7: Independent ?Dynamic Sitting Balance ?Dynamic Sitting - Level of Assistance: 5: Stand by assistance ?Static Standing Balance ?Static Standing - Level of Assistance:  (CGA) ?Dynamic Standing Balance ?Dynamic Standing - Level of Assistance:  (CGA) ?Extremity/Trunk Assessment ?RUE Assessment ?RUE Assessment: Within Functional Limits ?LUE Assessment ?Active Range of Motion (AROM) Comments: able to tolerate 40 degrees of elbow flex.  limited by ACE wrap dressing (per ortho - ROM at elbow is ok) ?General Strength Comments: NWB due to distal  humeral fracture ? ? ?[place] ?04/07/2022, 5:05 PM ?

## 2022-04-07 NOTE — Progress Notes (Signed)
Occupational Therapy Session Note ? ?Patient Details  ?Name: Christina Mercado ?MRN: [id number] ?Date of Birth: Feb 16, 1936 ? ?Today's Date: 04/07/2022 ?OT Individual Time: 1324-4010 and 1540-1610 ?OT Individual Time Calculation (min): 75 min and 30 min ? ? ?Short Term Goals: ?Week 1:  OT Short Term Goal 1 (Week 1): Pt will perform toilet/BSC transfer with LRAD and MIN A ?OT Short Term Goal 2 (Week 1): Pt will adhere to LUE NWB during transfers/ADL with no more than MIN verbal cues ?OT Short Term Goal 3 (Week 1): Pt will perform LB dress with AE PRN and MIN A ?OT Short Term Goal 4 (Week 1): Pt will perform 2/3 toileting tasks with CGA for standing balance ? ?Skilled Therapeutic Interventions/Progress Updates:  ?  Visit 1: ?Pain: c/o mild discomfort in LUE  ? ?Pt received in recliner dozing, but awoke easily. Pt would frequently doze off during session and she stated "I am so tired today".   ?Pt declined a bath stating "the nurses wipe me down every morning so I am clean" ?She donned a pullover shirt and snap front sweat shirt with mod A, total with arm sling.  She needs mod A to get pants over feet and hips, total shoes.  Pt stood from low recliner with min A and then ambulated to toilet with min using quad cane. She kept saying "I am so much more wobbly today". She did need more stabilizing A than yesterday.  Pt sat on toilet but unable to void.  CGA to stand from toilet.  She then ambulated to sink to brush teeth with set up. Pt able to tolerate standing for 3-4 minutes then sat in arm chair to rest. She then stood with close S and transferred with CGA to w/c.  Occasional cues needed for R hand placement. ? ?Pt sitting in w/c with powder board positioned on her lap to set up for gentle gravity eliminated LUE AROM focusing on elbow range of motion.  Pt also worked on Event organiser flex/ext.   ? ?Resting in w/c with belt alarm on and all needs met. Call light in [place]. ? ?Visit 2: ?Pain: no c/o pain ? ?Pt received in  recliner sleeping. She awoke easily and said "I am so tired today and I cant stay awake" ?Pt agreeable to working on ambulation and transfers.  Pt ambulated to bathroom, managed clothing and cleansed with min A.  Pt sat for a while, she had some gas but No void but had pt practice cleansing. ?Pt ambulated to bed and got settled into bed with mod A for positioning.  Pt resting in bed with all needs met.   ? ?Therapy Documentation ?Precautions:  ?Precautions ?Precautions: Fall ?Precaution Comments: LUE NWB ?Restrictions ?Weight Bearing Restrictions: Yes ?LUE Weight Bearing: Non weight bearing ? ?Vital Signs: ?Therapy Vitals ?Temp: 98.6 ?F (37 ?C) ?Temp Source: Oral ?Pulse Rate: 80 ?Resp: 18 ?BP: (!) 143/85 ?Patient Position (if appropriate): Lying ?Oxygen Therapy ?SpO2: 93 % ?O2 Device: Room Air ?Pain: ?Pain Assessment ?Pain Scale: 0-10 ?Pain Score: 4  ?Pain Type: Acute pain;Surgical pain ?Pain Location: Elbow ?Pain Orientation: Left ?Pain Descriptors / Indicators: Aching;Constant;Sharp;Sore ?Pain Frequency: Constant (worse with activity) ?Pain Onset: On-going ?Pain Intervention(s): Repositioned ?ADL: ?ADL ?Eating: Supervision/safety ?Grooming: Supervision/safety ?Upper Body Bathing: Moderate assistance ?Lower Body Bathing: Moderate assistance ?Upper Body Dressing: Moderate assistance ?Lower Body Dressing: Maximal assistance ?Toileting: Moderate assistance ?Where Assessed-Toileting: Toilet ?Toilet Transfer: Minimal assistance ?Toilet Transfer Method: Ambulating ?Toilet Transfer Equipment: Raised toilet seat ? ? ?Therapy/Group: Individual Therapy ? ?  [place] ?04/07/2022, 9:16 AM ?

## 2022-04-07 NOTE — Progress Notes (Signed)
Patient ID: Christina Mercado, female   DOB: [birth year], 86 y.o.   MRN: [id number]  Spoke with [facility] who has offered a bed and pt and son have accepted. Plan to transfer on Monday via non-emergency transport around 10:00. Have alerted team and will work toward discharge on Monday ?

## 2022-04-07 NOTE — Plan of Care (Signed)
?  Problem: Consults ?Goal: RH GENERAL PATIENT EDUCATION ?Description: See Patient Education module for education specifics. ?Outcome: Progressing ?Goal: Diabetes Guidelines if Diabetic/Glucose > 140 ?Description: If diabetic or lab glucose is > 140 mg/dl - Initiate Diabetes/Hyperglycemia Guidelines & Document Interventions  ?Outcome: Progressing ?  ?Problem: RH BOWEL ELIMINATION ?Goal: RH STG MANAGE BOWEL WITH ASSISTANCE ?Description: STG Manage Bowel with mod I Assistance. ?Outcome: Progressing ?Goal: RH STG MANAGE BOWEL W/MEDICATION W/ASSISTANCE ?Description: STG Manage Bowel with Medication with mod I Assistance. ?Outcome: Progressing ?  ?Problem: RH BLADDER ELIMINATION ?Goal: RH STG MANAGE BLADDER WITH ASSISTANCE ?Description: STG Manage Bladder With mod I Assistance ?Outcome: Progressing ?  ?Problem: RH SKIN INTEGRITY ?Goal: RH STG MAINTAIN SKIN INTEGRITY WITH ASSISTANCE ?Description: STG Maintain Skin Integrity With mod I Assistance. ?Outcome: Progressing ?  ?Problem: RH SAFETY ?Goal: RH STG ADHERE TO SAFETY PRECAUTIONS W/ASSISTANCE/DEVICE ?Description: STG Adhere to Safety Precautions With cues and reminders.  ?Outcome: Progressing ?Goal: RH STG DECREASED RISK OF FALL WITH ASSISTANCE ?Description: STG Decreased Risk of Fall With cues and reminders.  ?Outcome: Progressing ?  ?Problem: RH PAIN MANAGEMENT ?Goal: RH STG PAIN MANAGED AT OR BELOW PT'S PAIN GOAL ?Description: Pain level at or below 4 on scale of 0-10 ?Outcome: Progressing ?  ?Problem: RH KNOWLEDGE DEFICIT GENERAL ?Goal: RH STG INCREASE KNOWLEDGE OF SELF CARE AFTER HOSPITALIZATION ?Description: Pt will be able to demonstrate understanding of medication management and dietary restrictions with mod I assist.  ?Outcome: Progressing ?  ?

## 2022-04-07 NOTE — Progress Notes (Signed)
Contacted by [facility] CSW that pt will d/c to snf on Monday. [place] and spoke to [facility]. Clinic aware pt will resume care on Tuesday and that pt's son plans to transport pt. Will fax d/c summary to clinic for continuation of care as soon as it is available.  ? ?Melven Sartorius  ?Renal Navigator ?[phone] ?

## 2022-04-07 NOTE — Plan of Care (Signed)
?  Problem: RH Balance ?Goal: LTG Patient will maintain dynamic sitting balance (PT) ?Description: LTG:  Patient will maintain dynamic sitting balance with assistance during mobility activities (PT) ?Flowsheets (Taken 04/07/2022 1009) ?LTG: Pt will maintain dynamic sitting balance during mobility activities with:: (downgraded due to patient progress) Contact Guard/Touching assist ?Goal: LTG Patient will maintain dynamic standing balance (PT) ?Description: LTG:  Patient will maintain dynamic standing balance with assistance during mobility activities (PT) ?Flowsheets (Taken 04/07/2022 1009) ?LTG: Pt will maintain dynamic standing balance during mobility activities with:: (downgraded due to patient progress) Contact Guard/Touching assist ?  ?Problem: Sit to Stand ?Goal: LTG:  Patient will perform sit to stand with assistance level (PT) ?Description: LTG:  Patient will perform sit to stand with assistance level (PT) ?Flowsheets (Taken 04/07/2022 1009) ?LTG: PT will perform sit to stand in preparation for functional mobility with assistance level: (downgraded due to patient progress) Contact Guard/Touching assist ?  ?Problem: RH Bed Mobility ?Goal: LTG Patient will perform bed mobility with assist (PT) ?Description: LTG: Patient will perform bed mobility with assistance, with/without cues (PT). ?Flowsheets (Taken 04/07/2022 1009) ?LTG: Pt will perform bed mobility with assistance level of: (downgraded due to patient progress) Contact Guard/Touching assist ?  ?Problem: RH Bed to Chair Transfers ?Goal: LTG Patient will perform bed/chair transfers w/assist (PT) ?Description: LTG: Patient will perform bed to chair transfers with assistance (PT). ?Flowsheets (Taken 04/07/2022 1009) ?LTG: Pt will perform Bed to Chair Transfers with assistance level: (downgraded due to patient progress) Contact Guard/Touching assist ?  ?Problem: RH Car Transfers ?Goal: LTG Patient will perform car transfers with assist (PT) ?Description: LTG:  Patient will perform car transfers with assistance (PT). ?Flowsheets (Taken 04/07/2022 1009) ?LTG: Pt will perform car transfers with assist:: (downgraded due to patient progress) Contact Guard/Touching assist ?  ?Problem: RH Ambulation ?Goal: LTG Patient will ambulate in controlled environment (PT) ?Description: LTG: Patient will ambulate in a controlled environment, # of feet with assistance (PT). ?Flowsheets (Taken 04/07/2022 1009) ?LTG: Pt will ambulate in controlled environ  assist needed:: (downgraded due to patient progress) Contact Guard/Touching assist ?LTG: Ambulation distance in controlled environment: 50 ?Goal: LTG Patient will ambulate in home environment (PT) ?Description: LTG: Patient will ambulate in home environment, # of feet with assistance (PT). ?Flowsheets (Taken 04/07/2022 1009) ?LTG: Pt will ambulate in home environ  assist needed:: (downgraded due to patient progress) Contact Guard/Touching assist ?  ?Problem: RH Wheelchair Mobility ?Goal: LTG Patient will propel w/c in controlled environment (PT) ?Description: LTG: Patient will propel wheelchair in controlled environment, # of feet with assist (PT) ?Flowsheets (Taken 04/07/2022 1009) ?LTG: Pt will propel w/c in controlled environ  assist needed:: (downgraded due to patient progress) Contact Guard/Touching assist ?Goal: LTG Patient will propel w/c in home environment (PT) ?Description: LTG: Patient will propel wheelchair in home environment, # of feet with assistance (PT). ?Flowsheets (Taken 04/07/2022 1009) ?LTG: Pt will propel w/c in home environ  assist needed:: (downgraded due to patient progress) Contact Guard/Touching assist ?  ?

## 2022-04-07 NOTE — Plan of Care (Signed)
?Problem: RH Balance ?Goal: LTG Patient will maintain dynamic standing with ADLs (OT) ?Description: LTG:  Patient will maintain dynamic standing balance with assist during activities of daily living (OT)  ?Flowsheets (Taken 04/07/2022 0900) ?LTG: Pt will maintain dynamic standing balance during ADLs with: (LTG downgraded due to limited ability for balance with her prior balance deficits and limited use of UE support due to LUE NWB.) Minimal Assistance - Patient > 75% ?Note: LTG downgraded due to limited ability for balance with her prior balance deficits and limited use of UE support due to LUE NWB. ?  ?Problem: RH Bathing ?Goal: LTG Patient will bathe all body parts with assist levels (OT) ?Description: LTG: Patient will bathe all body parts with assist levels (OT) ?Flowsheets (Taken 04/07/2022 0900) ?LTG: Pt will perform bathing with assistance level/cueing: (LTG downgraded due to limited ability for balance with her prior balance deficits and limited use of UE support due to LUE NWB.) Moderate Assistance - Patient 50 - 74% ?Note: LTG downgraded due to limited ability for balance with her prior balance deficits and limited use of UE support due to LUE NWB. ?  ?Problem: RH Dressing ?Goal: LTG Patient will perform upper body dressing (OT) ?Description: LTG Patient will perform upper body dressing with assist, with/without cues (OT). ?Flowsheets (Taken 04/07/2022 0900) ?LTG: Pt will perform upper body dressing with assistance level of: (LTG downgraded due to limited ability for balance with her prior balance deficits and limited use of UE support due to LUE NWB.) Minimal Assistance - Patient > 75% ?Note: LTG downgraded due to limited ability for balance with her prior balance deficits and limited use of UE support due to LUE NWB. ?Goal: LTG Patient will perform lower body dressing w/assist (OT) ?Description: LTG: Patient will perform lower body dressing with assist, with/without cues in positioning using equipment  (OT) ?Flowsheets (Taken 04/07/2022 0900) ?LTG: Pt will perform lower body dressing with assistance level of: (LTG downgraded due to limited ability for balance with her prior balance deficits and limited use of UE support due to LUE NWB.) Moderate Assistance - Patient 50 - 74% ?Note: LTG downgraded due to limited ability for balance with her prior balance deficits and limited use of UE support due to LUE NWB. ?  ?Problem: RH Toileting ?Goal: LTG Patient will perform toileting task (3/3 steps) with assistance level (OT) ?Description: LTG: Patient will perform toileting task (3/3 steps) with assistance level (OT)  ?Flowsheets (Taken 04/07/2022 0900) ?LTG: Pt will perform toileting task (3/3 steps) with assistance level: (LTG downgraded due to limited ability for balance with her prior balance deficits and limited use of UE support due to LUE NWB.) Minimal Assistance - Patient > 75% ?Note: LTG downgraded due to limited ability for balance with her prior balance deficits and limited use of UE support due to LUE NWB. ?  ?Problem: RH Toilet Transfers ?Goal: LTG Patient will perform toilet transfers w/assist (OT) ?Description: LTG: Patient will perform toilet transfers with assist, with/without cues using equipment (OT) ?Flowsheets (Taken 04/07/2022 0900) ?LTG: Pt will perform toilet transfers with assistance level of: (LTG downgraded due to limited ability for balance with her prior balance deficits and limited use of UE support due to LUE NWB.) Contact Guard/Touching assist ?Note: LTG downgraded due to limited ability for balance with her prior balance deficits and limited use of UE support due to LUE NWB. ?  ?Problem: RH Tub/Shower Transfers ?Goal: LTG Patient will perform tub/shower transfers w/assist (OT) ?Description: LTG: Patient will perform tub/shower transfers  with assist, with/without cues using equipment (OT) ?Flowsheets (Taken 04/07/2022 0900) ?LTG: Pt will perform tub/shower stall transfers with assistance level  of: (LTG discontinued as pt prefers sponge baths and does not feel safe showering at this time with her LUE immobilized.) -- ?Note: LTG discontinued as pt prefers sponge baths and does not feel safe showering at this time with her LUE immobilized. ?  ?

## 2022-04-08 DIAGNOSIS — S42412D Displaced simple supracondylar fracture without intercondylar fracture of left humerus, subsequent encounter for fracture with routine healing: Principal | ICD-10-CM

## 2022-04-08 LAB — RENAL FUNCTION PANEL
Albumin: 2.7 g/dL — ABNORMAL LOW (ref 3.5–5.0)
Anion gap: 14 (ref 5–15)
BUN: 41 mg/dL — ABNORMAL HIGH (ref 8–23)
CO2: 21 mmol/L — ABNORMAL LOW (ref 22–32)
Calcium: 9.1 mg/dL (ref 8.9–10.3)
Chloride: 97 mmol/L — ABNORMAL LOW (ref 98–111)
Creatinine, Ser: 5.13 mg/dL — ABNORMAL HIGH (ref 0.44–1.00)
GFR, Estimated: 8 mL/min — ABNORMAL LOW (ref >60)
Glucose, Bld: 85 mg/dL (ref 70–99)
Phosphorus: 6.4 mg/dL — ABNORMAL HIGH (ref 2.5–4.6)
Potassium: 5 mmol/L (ref 3.5–5.1)
Sodium: 132 mmol/L — ABNORMAL LOW (ref 135–145)

## 2022-04-08 LAB — GLUCOSE, CAPILLARY
Glucose-Capillary: 87 mg/dL (ref 70–99)
Glucose-Capillary: 135 mg/dL — ABNORMAL HIGH (ref 70–99)
Glucose-Capillary: 94 mg/dL (ref 70–99)
Glucose-Capillary: 79 mg/dL (ref 70–99)

## 2022-04-08 LAB — CBC WITH DIFFERENTIAL/PLATELET
Abs Immature Granulocytes: 0.05 10*3/uL (ref 0.00–0.07)
Basophils Absolute: 0 10*3/uL (ref 0.0–0.1)
Basophils Relative: 0 %
Eosinophils Absolute: 0.1 10*3/uL (ref 0.0–0.5)
Eosinophils Relative: 1 %
HCT: 30.7 % — ABNORMAL LOW (ref 36.0–46.0)
Hemoglobin: 10.1 g/dL — ABNORMAL LOW (ref 12.0–15.0)
Immature Granulocytes: 1 %
Lymphocytes Relative: 14 %
Lymphs Abs: 1.4 10*3/uL (ref 0.7–4.0)
MCH: 31.5 pg (ref 26.0–34.0)
MCHC: 32.9 g/dL (ref 30.0–36.0)
MCV: 95.6 fL (ref 80.0–100.0)
Monocytes Absolute: 0.7 10*3/uL (ref 0.1–1.0)
Monocytes Relative: 8 %
Neutro Abs: 7.3 10*3/uL (ref 1.7–7.7)
Neutrophils Relative %: 76 %
Platelets: 317 10*3/uL (ref 150–400)
RBC: 3.21 MIL/uL — ABNORMAL LOW (ref 3.87–5.11)
RDW: 14.2 % (ref 11.5–15.5)
WBC: 9.6 10*3/uL (ref 4.0–10.5)
nRBC: 0 % (ref 0.0–0.2)

## 2022-04-08 MED ORDER — BISACODYL 10 MG RE SUPP: 10.0000 mg | Freq: Once | RECTAL | Status: DC | PRN

## 2022-04-08 MED ORDER — HEPARIN SODIUM (PORCINE) 1000 UNIT/ML IJ SOLN
INTRAMUSCULAR | Status: AC
  Filled 2022-04-08: qty 4

## 2022-04-08 MED ORDER — BISACODYL 10 MG RE SUPP
10.0000 mg | Freq: Once | RECTAL | Status: DC
Start: 2022-04-08 — End: 2022-04-08

## 2022-04-08 MED ORDER — POLYETHYLENE GLYCOL 3350 17 G PO PACK
17.0000 g | PACK | Freq: Two times a day (BID) | ORAL | Status: DC
  Administered 2022-04-08 – 2022-04-10 (×4): 17 g via ORAL
  Filled 2022-04-08 (×4): qty 1

## 2022-04-08 NOTE — Progress Notes (Signed)
PROGRESS NOTE   Subjective/Complaints: Patient seen today at bedside following dialysis.  Family was present including son and daughter-in-law.  Family had concerns about patient being a little bit slower to respond to questions.  Also some concerns about being more tired after getting Zofran during dialysis.  Patient today is able to interact appropriately and answer questions relating to orientation and is following commands appropriately.  ROS: Patient denies chest pain shortness of breath nausea vomiting abdominal pain or headache.  Patient does not have constipation or diarrhea.  Objective:   No results found. Recent Labs    04/08/22 1430  WBC 9.6  HGB 10.1*  HCT 30.7*  PLT 317   Recent Labs    04/07/22 1458 04/08/22 0539  NA 133* 132*  K 4.7 5.0  CL 96* 97*  CO2 24 21*  GLUCOSE 95 85  BUN 36* 41*  CREATININE 4.76* 5.13*  CALCIUM 9.4 9.1    Intake/Output Summary (Last 24 hours) at 04/08/2022 1654 Last data filed at 04/08/2022 1500 Gross per 24 hour  Intake 310 ml  Output 1044 ml  Net -734 ml        Physical Exam:  Constitutional: No distress . Vital signs reviewed. HENT: Normocephalic.  Atraumatic. NCAT, oral membranes moist. Neck: supple Eyes: EOMI. No discharge. Cardiovascular: RRR. No JVD. Respiratory: CTA Bilaterally. Normal effort. GI: BS +. Non-distended, non-tender. Neurological: Alert and oriented x4 answers all orientation questions correctly.  There was a brief delay in answering questions potentially due to being tired from dialysis and having taken Zofran. Sensory: LT intact all extremities. Musculoskeletal: No edema or tenderness in extremities. No cyanosis. Comments: UE strength on RUE 5 -/5 LUE-grip 5 - less/5 otherwise not tested Can lift against gravity Skin: General: skin dry and warm Comments: HD catheter, IV R FA, incision covered with ACE wrap which is C/D/I Psych: Pleasant  and cooperative.  A little bit sleepy after taking Zofran.  Vital Signs Blood pressure (!) 99/56, pulse 99, temperature 98.5 F (36.9 C), resp. rate 18, height  (1.651 m), weight 65.8 kg, SpO2 96 %.    Assessment/Plan: 1. Functional deficits which require 3+ hours per day of interdisciplinary therapy in a comprehensive inpatient rehab setting. Physiatrist is providing close team supervision and 24 hour management of active medical problems listed below. Physiatrist and rehab team continue to assess barriers to discharge/monitor patient progress toward functional and medical goals  Care Tool:  Bathing    Body parts bathed by patient: Left arm, Chest, Abdomen, Right upper leg, Left upper leg, Right lower leg, Left lower leg, Face, Front perineal area, Buttocks   Body parts bathed by helper: Right arm     Bathing assist Assist Level: Minimal Assistance - Patient > 75%     Upper Body Dressing/Undressing Upper body dressing   What is the patient wearing?: Pull over shirt    Upper body assist Assist Level: Minimal Assistance - Patient > 75%    Lower Body Dressing/Undressing Lower body dressing      What is the patient wearing?: Pants, Incontinence brief     Lower body assist Assist for lower body dressing: Moderate Assistance - Patient 50 -  74%     Toileting Toileting    Toileting assist Assist for toileting: Minimal Assistance - Patient > 75%     Transfers Chair/bed transfer  Transfers assist     Chair/bed transfer assist level: Contact Guard/Touching assist Chair/bed transfer assistive device: Cane (quad cane)   Locomotion Ambulation   Ambulation assist      Assist level: Contact Guard/Touching assist Assistive device: Cane-quad Max distance: 27ft   Walk 10 feet activity   Assist     Assist level: Minimal Assistance - Patient > 75% Assistive device: Cane-quad   Walk 50 feet activity   Assist Walk 50 feet with 2 turns activity did not  occur: Safety/medical concerns         Walk 150 feet activity   Assist Walk 150 feet activity did not occur: Safety/medical concerns         Walk 10 feet on uneven surface  activity   Assist Walk 10 feet on uneven surfaces activity did not occur: Safety/medical concerns         Wheelchair     Assist Is the patient using a wheelchair?: Yes Type of Wheelchair: Manual    Wheelchair assist level: Maximal Assistance - Patient 25 - 49% Max wheelchair distance: 125ft    Wheelchair 50 feet with 2 turns activity    Assist        Assist Level: Maximal Assistance - Patient 25 - 49%   Wheelchair 150 feet activity     Assist      Assist Level: Maximal Assistance - Patient 25 - 49%   Blood pressure (!) 99/56, pulse 99, temperature 98.5 F (36.9 C), resp. rate 18, height 5\' 5"  (1.651 m), weight 65.8 kg, SpO2 96 %.  Medical Problem List and Plan: 1. Functional deficits secondary to left humeral fracture NWB due to ground level fall, status post OR IMF on 03/29/2022 by Dr. Jena Gauss.            - Patient may not shower-HD catheter             -ELOS/Goals: 12-14 days-mod I             - SNF planned for Monday           -Continue CIR-PT, and OT-likely patient was a high fall risk BEFORE admission 2.  Antithrombotics: -DVT/anticoagulation: Pharmaceutical: Heparin             -antiplatelet therapy: None 3. Pain Management: Postoperative pain from left humerus fracture; continue Tylenol and oxycodone as needed.  Add vitamin C to help with pain sensitivity. -4/25-we will change oxycodone to 10 mg every 4 as needed-see if that helps, but monitor constipation, since already an issue -04/05/2022-patient this is any difference in pain with the oxycodone regimen however has not had pain meds yet this morning -04/06/22-patient reports as needed oxycodone helping to keep pain manageable we will continue -04/08/22 patient says pain is manageable and has not needed the oxycodone  to manage pain however continue to monitor 4. Mood: continue social skills support and encouragement - Antipsychotic agents; not applicable 5. Neuropsych: This patient is capable of making decisions on her own behalf however has short-term memory deficits. -04/08/2022.  Patient is able to answer neuro questions appropriately but some responses are little bit delayed may be due to being tired from dialysis and/or Zofran. 6. Skin/Wound Care: Routine skin checks Local wound care for incision and HD catheter care and CHG baths 7. Fluids/Electrolytes/Nutrition: Routine in and outs  with follow-up chemistries On hemodialysis 8.  ESRD-on HD Tuesday and Saturday per renal 9.  Hard of hearing DM-on SSI-no oral agents due to DM -04/06/2022 mild hypoglycemia, improved with food, has not had any recent doses of insulin, continue to monitor glucose -4/28 CBG overall well controlled continue to monitor -4/29 no events of high or low blood sugars continue to monitor   CBG (last 3)  Recent Labs    04/08/22 0556 04/08/22 1146 04/08/22 1641  GLUCAP 87 135* 94    11.  Hypertension-actually been having some orthostatic hypotension-will defer to renal to help got IV bolus today.  -04/05/2022-soft but asymptomatic-continue regimen  -04/07/2022-well controlled overall continue hydralazine amlodipine   -04/08/22-soft BPs following dialysis continue to measure and monitor may hold amlodipine if systolic blood pressures less than than 100     04/08/2022   12:46 PM 04/08/2022   10:56 AM 04/08/2022   10:30 AM  Vitals with BMI  Weight  145 lbs 1 oz   BMI  24.14   Systolic 99 100 91  Diastolic 56 50 39  Pulse 99 94 79    12.  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  Seen at [facility]. 13.  Tachycardia-HR running 100s to 110s at rest-likely due to decreased fluid levels-IV bolus given this a.m.-Labs in a.m. -04/05/2022-heart rate 80s-doing better -04/08/2022 heart rates is mostly in the 90's, stable.   CTM s/p dialysis today. 14.  UTI-on Rocephin IV-needs 7 days - Rocephin was completed on 04/04/2022 15.  Constipation-LBM 3 to 4 days ago-we will give sorbitol 15 cc and repeat if needed. -4/26 another dose of sorbitol and soap suds enema given -4/28 Soap suds enema given -04/08/2022 patient still reports some constipation per family last BM maybe 2 or 3 days ago.  Continue bowel regimen with Colace twice daily 100 mg and MiraLAX daily 17 g. -Encourage mobility with PT. Consider adding dulcolax suppository, prn. 16.  Left humeral fracture-nonweightbearing on left upper extremity. 17.-17.  Nausea and vomiting-has Zofran and Reglan as needed-we will monitor-May be due to constipation-we will see if improves with BM - 04/04/2022-likely constipation-working on it - 04/05/2022 another dose of sorbitol and symptoms enema today - 04/06/2022-reports nausea is improved but does not does not have required recent use of as needed Zofran and Reglan for 2028 order.  Sorbitol and soapsuds enema today -04/07/2022 order sorbitol and soapsuds enema -04/08/22 patient had a some nausea and dialysis was given Zofran continue to monitor as well as continue to monitor for bowel movements 19.  Neck pain: Add K-pad.  20.  Overweight: BMI 25.09: Provide dietary education 21.  Hyperkalemia - 04/04/22-potassium 5.3 per renal - 04/05/2022 K25.7-Dialysis today -04/06/2022 K improved to 4.7 today -04/08/22 K 6.0 today per renal    LOS: 7 days A FACE TO FACE EVALUATION WAS PERFORMED  Tressia Miners, FNP 04/08/2022, 4:54 PM

## 2022-04-08 NOTE — Progress Notes (Addendum)
They have with her ?[facility] ?Progress Note  ? ?86 y.o. 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 She usually dialyzes Tues and Thur with [facility] [place] [facility]. Unfortunately she tripped and fell sustaining a supracondylar distal humerus fracture s/p ORIF on 4/19. She actually had a RIJ TC placed by [facility] as the left upper arm access would not be accessible with the fracture + ORIF.  ? ?Editor, commissioning on [place] ?Tues Sat 3hr 15 min EDW 64kg ?Venofer 50qw  Mircera 50 q4 (last 4/11) ?Calcitriol 0.25 BIW ?H1300 bolus 600/h ? ?Assessment/ Plan:   ?ESRD - usually dialyzed Tues and Sat at [facility] on [address] Tues/Sat schedule here ?3h26mn, 2K, 400/600; No heparin; 2-3L UF ?R IJ TDC, has AVF but not accessible during injury/sling, remains patent ?HD today - tolerating well ? ?Left elbow fracture - appreciate vascular placing a RIJ TC and will need to wait till left arm is accessible before attempting dialysis through [facility]. S/p ORIF left supracondylar humerus fracture on 4/19.  ?Renal osteodystrophy - phos -> up/down on renvela 1 tab qAC, CTM ?Anemia - TSAT 12%; iv iron on dialysis; not due for her ESA yet (last given 4/11 and is q4wk).  On Mircera as outpatient. 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  [facility]?Hyperkalemia - Resolved after lokelma 10g yesterday. On renal diet.   Check in  AM.  ? ?Subjective:   ?Seen on dialysis - goal UF 2.5L tolerating treatment well ?Denies cp/dyspnea/edema  ? ?Objective:   ?BP 117/68   Pulse 61   Temp 97.6 ?F (36.4 ?C) (Temporal)   Resp 18   Ht '5\' 5"'$  (1.651 m)   Wt 66.8 kg   SpO2 98%   BMI 24.51 kg/m?  ?No intake or output data in the 24 hours ending 04/08/22 0824 ? ?Weight change:  ? ?Physical Exam: ?GEN: NAD, A&Ox3, NCAT, pleasant, very hard of hearing, in bed  ?HEENT: EOMI ?NECK: Supple ?LUNGS: CTA B/L no rales,  rhonchi or wheezing ?CV: RRR, No M/R/G ?ABD: SNDNT +BS  ?EXT: No lower extremity edema ?ACCESS: lt arm + bruit even thru bandages, RIJ TC ? ? ? ?Imaging: ?No results found. ? ?Labs: ?BMET ?Recent Labs  ?Lab 04/03/22 ?0390304/25/23 ?0009204/26/23 ?0606 04/06/22 ?0330004/28/23 ?0501 04/07/22 ?1458 04/08/22 ?07622 ?NA 132* 133* 131* 134* 133* 133* 132*  ?K 5.1 5.3* 5.7* 4.7 6.0* 4.7 5.0  ?CL 97* 97* 98 100 96* 96* 97*  ?CO2 '25 25 22 27 27 24 '$ 21*  ?GLUCOSE 89 110* 86 83 87 95 85  ?BUN 53* 64* 70* 22 34* 36* 41*  ?CREATININE 5.09* 6.06* 6.37* 3.47* 4.73* 4.76* 5.13*  ?CALCIUM 8.9 9.1 8.9 9.1 9.5 9.4 9.1  ?PHOS 7.0* 7.6* 7.7* 5.3* 6.2* 6.3* 6.4*  ? ? ?CBC ?Recent Labs  ?Lab 04/02/22 ?06333 ?WBC 7.2  ?NEUTROABS 4.3  ?HGB 9.8*  ?HCT 30.6*  ?MCV 97.1  ?PLT 190  ? ? ? ?Medications:   ? ? acetaminophen  650 mg Oral Q6H  ? amLODipine  2.5 mg Oral Daily  ? vitamin C  250 mg Oral Daily  ? atorvastatin  40 mg Oral Daily  ? Chlorhexidine Gluconate Cloth  6 each Topical BID  ? cholecalciferol  2,000 Units Oral Daily  ? docusate sodium  100 mg Oral BID  ? DULoxetine  60 mg Oral Daily  ? heparin injection (subcutaneous)  5,000 Units  Subcutaneous Q8H  ? insulin aspart  0-6 Units Subcutaneous TID AC & HS  ? levothyroxine  150 mcg Oral Q0600  ? polyethylene glycol  17 g Oral Daily  ? sevelamer carbonate  800 mg Oral TID WC  ? ?Justin Mend, MD  ?04/08/2022, 8:24 AM  ? ?

## 2022-04-08 NOTE — Progress Notes (Signed)
Occupational Therapy Session Note ? ?Patient Details  ?Name: Christina Mercado ?MRN: [id number] ?Date of Birth: 07-10-1936 ? ? ?Short Term Goals: ?Week 1:  OT Short Term Goal 1 (Week 1): Pt will perform toilet/BSC transfer with LRAD and MIN A ?OT Short Term Goal 2 (Week 1): Pt will adhere to LUE NWB during transfers/ADL with no more than MIN verbal cues ?OT Short Term Goal 3 (Week 1): Pt will perform LB dress with AE PRN and MIN A ?OT Short Term Goal 4 (Week 1): Pt will perform 2/3 toileting tasks with CGA for standing balance ? ?Skilled Therapeutic Interventions/Progress Updates:  ?Patient off floor for dialysis. Missed 60 minutes of skilled OT treatment session.   ? ?Therapy Documentation ?Precautions:  ?Precautions ?Precautions: Fall ?Precaution Comments: LUE NWB ?Required Braces or Orthoses: Sling ?Restrictions ?Weight Bearing Restrictions: Yes ?LUE Weight Bearing: Non weight bearing ?General: ?General ?OT Amount of Missed Time: 60 Minutes ? ?Therapy/Group: Individual Therapy ? ?Fredrick Dray R [place]-Davis ?04/08/2022, 6:57 AM ?

## 2022-04-09 LAB — RENAL FUNCTION PANEL
Albumin: 2.6 g/dL — ABNORMAL LOW (ref 3.5–5.0)
Anion gap: 12 (ref 5–15)
BUN: 20 mg/dL (ref 8–23)
CO2: 24 mmol/L (ref 22–32)
Calcium: 8.8 mg/dL — ABNORMAL LOW (ref 8.9–10.3)
Chloride: 95 mmol/L — ABNORMAL LOW (ref 98–111)
Creatinine, Ser: 3.43 mg/dL — ABNORMAL HIGH (ref 0.44–1.00)
GFR, Estimated: 13 mL/min — ABNORMAL LOW (ref >60)
Glucose, Bld: 77 mg/dL (ref 70–99)
Phosphorus: 4.7 mg/dL — ABNORMAL HIGH (ref 2.5–4.6)
Potassium: 3.8 mmol/L (ref 3.5–5.1)
Sodium: 131 mmol/L — ABNORMAL LOW (ref 135–145)

## 2022-04-09 LAB — GLUCOSE, CAPILLARY
Glucose-Capillary: 75 mg/dL (ref 70–99)
Glucose-Capillary: 83 mg/dL (ref 70–99)
Glucose-Capillary: 99 mg/dL (ref 70–99)
Glucose-Capillary: 113 mg/dL — ABNORMAL HIGH (ref 70–99)

## 2022-04-09 MED ORDER — HYDROCORTISONE 1 % EX CREA
TOPICAL_CREAM | Freq: Three times a day (TID) | CUTANEOUS | Status: DC | PRN
  Filled 2022-04-09: qty 28

## 2022-04-09 MED ORDER — QUETIAPINE FUMARATE 25 MG PO TABS
25.0000 mg | ORAL_TABLET | Freq: Every day | ORAL | Status: DC
  Administered 2022-04-09: 25 mg via ORAL
  Filled 2022-04-09: qty 1

## 2022-04-09 NOTE — Progress Notes (Signed)
They have with her ?[facility] ?Progress Note  ? ?86 y.o. 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 She usually dialyzes Tues and Thur with [facility] [place] [facility]. Unfortunately she tripped and fell sustaining a supracondylar distal humerus fracture s/p ORIF on 4/19. She actually had a RIJ TC placed by [facility] as the left upper arm access would not be accessible with the fracture + ORIF.  ? ?Editor, commissioning on [place] ?Tues Sat 3hr 15 min EDW 64kg ?Venofer 50qw  Mircera 50 q4 (last 4/11) ?Calcitriol 0.25 BIW ?H1300 bolus 600/h ? ?Assessment/ Plan:   ?ESRD - usually dialyzes Tues and Sat at [facility] on [address] Tues/Sat schedule here ?3h32mn, 2K, 400/600; No heparin; 2-3L UF ?R IJ TDC, has AVF but not accessible during injury/sling, remains patent ?HD next Tues - if d/c will be done outpt ? ?Left elbow fracture - appreciate vascular placing a RIJ TC and will need to wait till left arm is accessible before attempting dialysis through [facility]. S/p ORIF left supracondylar humerus fracture on 4/19.  ?Renal osteodystrophy - phos -> up/down on renvela 1 tab qAC, CTM ?Anemia - TSAT 12%; iv iron on dialysis; not due for her ESA yet (last given 4/11 and is q4wk).  On Mircera as outpatient. 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  [facility]?Hyperkalemia - Resolved after lokelma 10g yesterday. On renal diet.   Check in  AM.  ? ?Subjective:   ?Seen in room - doing PT work on Sunday ?Plan is d/c tomorrow therapists tell me  ?Denies cp/dyspnea/edema  ? ?Objective:   ?BP (!) 157/67 (BP Location: Right Arm)   Pulse 98   Temp 98.4 ?F (36.9 ?C) (Oral)   Resp 17   Ht '5\' 5"'$  (1.651 m)   Wt 68.8 kg   SpO2 97%   BMI 25.24 kg/m?  ? ?Intake/Output Summary (Last 24 hours) at 04/09/2022 1118 ?Last data filed at 04/09/2022 0808 ?Gross per 24 hour  ?Intake 550 ml  ?Output 350 ml  ?Net 200 ml   ? ? ?Weight change:  ? ?Physical Exam: ?GEN: NAD, A&Ox3, NCAT, pleasant, very hard of hearing, in WC  ?HEENT: EOMI ?NECK: Supple ?LUNGS: CTA B/L no rales, rhonchi or wheezing ?CV: RRR, No M/R/G ?ABD: SNDNT +BS  ?EXT: No lower extremity edema ?ACCESS: lt arm + bruit even thru bandages, RIJ TC ? ? ? ?Imaging: ?No results found. ? ?Labs: ?BMET ?Recent Labs  ?Lab 04/04/22 ?0824204/26/23 ?0606 04/06/22 ?0353604/28/23 ?0501 04/07/22 ?1458 04/08/22 ?0144304/30/23 ?01540 ?NA 133* 131* 134* 133* 133* 132* 131*  ?K 5.3* 5.7* 4.7 6.0* 4.7 5.0 3.8  ?CL 97* 98 100 96* 96* 97* 95*  ?CO2 '25 22 27 27 24 '$ 21* 24  ?GLUCOSE 110* 86 83 87 95 85 77  ?BUN 64* 70* 22 34* 36* 41* 20  ?CREATININE 6.06* 6.37* 3.47* 4.73* 4.76* 5.13* 3.43*  ?CALCIUM 9.1 8.9 9.1 9.5 9.4 9.1 8.8*  ?PHOS 7.6* 7.7* 5.3* 6.2* 6.3* 6.4* 4.7*  ? ? ?CBC ?Recent Labs  ?Lab 04/08/22 ?1430  ?WBC 9.6  ?NEUTROABS 7.3  ?HGB 10.1*  ?HCT 30.7*  ?MCV 95.6  ?PLT 317  ? ? ? ?Medications:   ? ? acetaminophen  650 mg Oral Q6H  ? amLODipine  2.5 mg Oral Daily  ? vitamin C  250 mg Oral Daily  ? atorvastatin  40 mg Oral Daily  ? Chlorhexidine Gluconate Cloth  6 each Topical BID  ? cholecalciferol  2,000 Units Oral Daily  ? docusate sodium  100 mg Oral BID  ? DULoxetine  60 mg Oral Daily  ? heparin injection (subcutaneous)  5,000 Units Subcutaneous Q8H  ? insulin aspart  0-6 Units Subcutaneous TID AC & HS  ? levothyroxine  150 mcg Oral Q0600  ? polyethylene glycol  17 g Oral BID  ? sevelamer carbonate  800 mg Oral TID WC  ? ?Justin Mend, MD  ?04/09/2022, 11:18 AM  ? ?

## 2022-04-09 NOTE — Progress Notes (Signed)
Occupational Therapy Session Note ? ?Patient Details  ?Name: Christina Mercado ?MRN: [id number] ?Date of Birth: 01-12-1936 ? ?Today's Date: 04/09/2022 ?OT Individual Time: 0076-2263 ?OT Individual Time Calculation (min): 45 min  ? ? ?Short Term Goals: ?Week 1:  OT Short Term Goal 1 (Week 1): Pt will perform toilet/BSC transfer with LRAD and MIN A ?OT Short Term Goal 2 (Week 1): Pt will adhere to LUE NWB during transfers/ADL with no more than MIN verbal cues ?OT Short Term Goal 3 (Week 1): Pt will perform LB dress with AE PRN and MIN A ?OT Short Term Goal 4 (Week 1): Pt will perform 2/3 toileting tasks with CGA for standing balance ? ? ?Skilled Therapeutic Interventions/Progress Updates:  ?  Subjective: Pt states soreness in left elbow.  Hard of hearing needing therapist to speak on right side.  Very talkative but easily redirected. ?  Objective:  Pt semi reclined in bed. Instructed pt through [facility] shoulder forward flexion with elbow slightly flexed in supine position using weightless dowel. Completed AROM elbow flexion and extension with pillow/cushion underneath humerus and elbow for support and positioning. AROM forearm pro/sup, wrist flexion/extension and digital composite flex/ext.  Completed all therex 2 x 10 reps needing frequent multimodal cues to stay on task due to internally distracted.  Doffed ace wrap over elbow and noted aquacel bandage over incision.  Opted to donn large heelbow over elbow for extra cushion and left acewrap off to manage swelling of forearm, wrist, and hand.  Pt reports improved comfort after heelbo donned.  Made nurse aware of elbow pain and request for pain medication.  Call Mercado in reach, bed alarm on.   ?  Assessment:  Pt making progress evidenced by achieving AAROM left shoulder to approximately 90 degrees in supine and WFL elbow ROM. WNL left digital AROM. Primary barriers today included distractibility and left elbow soreness. ?  Plan: D/C to SNF tomorrow for further care  . ? ?Therapy Documentation ?Precautions:  ?Precautions ?Precautions: Fall ?Precaution Comments: LUE NWB ?Required Braces or Orthoses: Sling ?Restrictions ?Weight Bearing Restrictions: Yes ?LUE Weight Bearing: Non weight bearing ? ? ? ?Therapy/Group: Individual Therapy ? ?Christina Mercado ?04/09/2022, 10:42 AM ?

## 2022-04-09 NOTE — Progress Notes (Signed)
Physical Therapy Session Note ? ?Patient Details  ?Name: Christina Mercado ?MRN: [id number] ?Date of Birth: Mar 29, 1936 ? ?Today's Date: 04/09/2022 ?PT Individual Time: 1100-1148 ?PT Individual Time Calculation (min): 48 min  ? ?Short Term Goals: ?Week 1:  PT Short Term Goal 1 (Week 1): Pt will complete bed mobility with minA ?PT Short Term Goal 2 (Week 1): Pt will complete bed<>chair transfers with minA and LRAD ?PT Short Term Goal 3 (Week 1): Pt will ambulate 72f with minA and LRAD ?PT Short Term Goal 4 (Week 1): Pt will improve BERG balance score by at least 7 points to indicate improved balance. ? ?Skilled Therapeutic Interventions/Progress Updates:  ?  Pt received supine in bed, agreeable to PT session. Pt reports 6/10 pain in her LUE and 5/10 pain in her RUE from working with OT prior to this session. Utilized repositioning and rest breaks as needed for pain management during session. Bed mobility CGA this session. Sit to stand and transfers with use of LBQC and CGA. Ambulation [facility] with cues for sequencing and safety awareness. Pt reports urge to toilet. Toilet transfer with use of LBQC and CGA. Pt had been incontinent of bowel in her brief, able to further void once seated on toilet. Pt is dependent for pericare and clothing management due to impaired balance in standing. Pt also reports pain with having a BM due to stool feeling like "rocks" as well as burning in periarea. Nursing notified of pt's complaints. Pt declines any further participation in session following toileting due to fatigue. Pt left seated in recliner in room with needs in reach, quick release belt and chair alarm in place. Pt missed 12 min of scheduled therapy session due to fatigue. ? ?Therapy Documentation ?Precautions:  ?Precautions ?Precautions: Fall ?Precaution Comments: LUE NWB ?Required Braces or Orthoses: Sling ?Restrictions ?Weight Bearing Restrictions: Yes ?LUE Weight Bearing: Non weight bearing ?General: ?PT  Amount of Missed Time (min): 12 Minutes ?PT Missed Treatment Reason: Patient unwilling to participate;Patient fatigue ? ? ? ? ?Therapy/Group: Individual Therapy ? ? ?TExcell Seltzer PT, DPT, CSRS ?04/09/2022, 12:04 PM  ?

## 2022-04-09 NOTE — Progress Notes (Signed)
PROGRESS NOTE   Subjective/Complaints: Patient seen today at bedside, reports "feels much better today" after BM x2 following dulcolax suppository yesterday.  Patient reports that the nausea that she had yesterday is also resolved.  ROS: Patient denies chest pain shortness of breath nausea vomiting abdominal pain or headache.  Patient does not have constipation or diarrhea.  Objective:   No results found. Recent Labs    04/08/22 1430  WBC 9.6  HGB 10.1*  HCT 30.7*  PLT 317   Recent Labs    04/08/22 0539 04/09/22 0535  NA 132* 131*  K 5.0 3.8  CL 97* 95*  CO2 21* 24  GLUCOSE 85 77  BUN 41* 20  CREATININE 5.13* 3.43*  CALCIUM 9.1 8.8*    Intake/Output Summary (Last 24 hours) at 04/09/2022 1446 Last data filed at 04/09/2022 2951 Gross per 24 hour  Intake 550 ml  Output 350 ml  Net 200 ml        Physical Exam:  Constitutional: No distress . Vital signs reviewed. HENT: Normocephalic.  Atraumatic. NCAT, oral membranes moist. Neck: supple Eyes: EOMI. No discharge. Cardiovascular: RRR. No JVD. Respiratory: CTA Bilaterally. Normal effort. GI: BS +. Non-distended, non-tender. Neurological: Alert and oriented to person, place, and situation, but had some difficulty with reporting the year.  Pt may have STM deficits. Sensory: LT intact all extremities. Musculoskeletal: No edema or tenderness in extremities. No cyanosis. Comments: UE strength on RUE 5 -/5 LUE-grip 5 - less/5 otherwise not tested Can lift against gravity Skin: General: skin dry and warm Comments: HD catheter, IV R FA, incision covered with ACE wrap which is C/D/I Psych: Pleasant and cooperative.  A little bit sleepy after taking Zofran.   Vital Signs Blood pressure (!) 157/67, pulse 98, temperature 98.4 F (36.9 C), temperature source Oral, resp. rate 17, height  (1.651 m), weight 68.8 kg, SpO2 97 %.    Assessment/Plan: 1. Functional  deficits which require 3+ hours per day of interdisciplinary therapy in a comprehensive inpatient rehab setting. Physiatrist is providing close team supervision and 24 hour management of active medical problems listed below. Physiatrist and rehab team continue to assess barriers to discharge/monitor patient progress toward functional and medical goals  Care Tool:  Bathing    Body parts bathed by patient: Left arm, Chest, Abdomen, Right upper leg, Left upper leg, Right lower leg, Left lower leg, Face, Front perineal area, Buttocks   Body parts bathed by helper: Right arm     Bathing assist Assist Level: Minimal Assistance - Patient > 75%     Upper Body Dressing/Undressing Upper body dressing   What is the patient wearing?: Pull over shirt    Upper body assist Assist Level: Minimal Assistance - Patient > 75%    Lower Body Dressing/Undressing Lower body dressing      What is the patient wearing?: Pants, Incontinence brief     Lower body assist Assist for lower body dressing: Moderate Assistance - Patient 50 - 74%     Toileting Toileting    Toileting assist Assist for toileting: Minimal Assistance - Patient > 75%     Transfers Chair/bed transfer  Transfers assist  Chair/bed transfer assist level: Contact Guard/Touching assist Chair/bed transfer assistive device: Theatre manager   Ambulation assist      Assist level: Contact Guard/Touching assist Assistive device: Cane-quad Max distance: 50'   Walk 10 feet activity   Assist     Assist level: Contact Guard/Touching assist Assistive device: Cane-quad   Walk 50 feet activity   Assist Walk 50 feet with 2 turns activity did not occur: Safety/medical concerns  Assist level: Contact Guard/Touching assist Assistive device: Cane-quad    Walk 150 feet activity   Assist Walk 150 feet activity did not occur: Safety/medical concerns         Walk 10 feet on uneven surface   activity   Assist Walk 10 feet on uneven surfaces activity did not occur: Safety/medical concerns         Wheelchair     Assist Is the patient using a wheelchair?: Yes Type of Wheelchair: Manual    Wheelchair assist level: Contact Guard/Touching assist Max wheelchair distance: 75 ft    Wheelchair 50 feet with 2 turns activity    Assist        Assist Level: Contact Guard/Touching assist   Wheelchair 150 feet activity     Assist      Assist Level: Moderate Assistance - Patient 50 - 74%   Blood pressure (!) 157/67, pulse 98, temperature 98.4 F (36.9 C), temperature source Oral, resp. rate 17, height 5\' 5"  (1.651 m), weight 68.8 kg, SpO2 97 %.  Medical Problem List and Plan: 1. Functional deficits secondary to left humeral fracture NWB due to ground level fall, status post OR IMF on 03/29/2022 by Dr. Jena Gauss.            - Patient may not shower-HD catheter             -ELOS/Goals: 12-14 days-mod I             - SNF planned for Monday           -Continue CIR-PT, and OT-likely patient was a high fall risk BEFORE admission 2.  Antithrombotics: -DVT/anticoagulation: Pharmaceutical: Heparin             -antiplatelet therapy: None 3. Pain Management: Postoperative pain from left humerus fracture; continue Tylenol and oxycodone as needed.  Add vitamin C to help with pain sensitivity. -4/25-we will change oxycodone to 10 mg every 4 as needed-see if that helps, but monitor constipation, since already an issue -04/05/2022-patient this is any difference in pain with the oxycodone regimen however has not had pain meds yet this morning -04/06/22-patient reports as needed oxycodone helping to keep pain manageable we will continue -04/08/22 patient says pain is manageable and has not needed the oxycodone to manage pain however continue to monitor -4/30 No complaints of pain today 4. Mood: continue social skills support and encouragement - Antipsychotic agents; not  applicable 5. Neuropsych: This patient is capable of making decisions on her own behalf however has short-term memory deficits. -04/08/2022.  Patient is able to answer neuro questions appropriately but some responses are little bit delayed may be due to being tired from dialysis and/or Zofran. -4/30 Neuropsych stable, STM deficits 6. Skin/Wound Care: Routine skin checks Local wound care for incision and HD catheter care and CHG baths 7. Fluids/Electrolytes/Nutrition: Routine in and outs with follow-up chemistries On hemodialysis 8.  ESRD-on HD Tuesday and Saturday per renal 9.  Hard of hearing DM-on SSI-no oral agents due to DM -04/06/2022 mild hypoglycemia,  improved with food, has not had any recent doses of insulin, continue to monitor glucose -4/28 CBG overall well controlled continue to monitor -4/29 no events of high or low blood sugars continue to monitor -4/30 CBG's stable   CBG (last 3)  Recent Labs    04/08/22 1954 04/09/22 0549 04/09/22 1200  GLUCAP 79 75 83    11.  Hypertension-actually been having some orthostatic hypotension-will defer to renal to help got IV bolus today.  -04/05/2022-soft but asymptomatic-continue regimen  -04/07/2022-well controlled overall continue hydralazine amlodipine   -04/08/22-soft BPs following dialysis continue to measure and monitor may hold amlodipine if systolic blood pressures less than than 100 -4/30 BP increasing today with low BP yesterday possibly related to dialysis treatment. Continue 2.5 mg amlodipine.     04/09/2022    3:28 AM 04/09/2022    3:20 AM 04/08/2022    7:53 PM  Vitals with BMI  Weight 151 lbs 11 oz    BMI 25.24    Systolic  157 131  Diastolic  67 56  Pulse  98 87    12.  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  Seen at [facility]. 13.  Tachycardia-HR running 100s to 110s at rest-likely due to decreased fluid levels-IV bolus given this a.m.-Labs in a.m. -04/05/2022-heart rate 80s-doing better -04/08/2022 heart  rates is mostly in the 90's, stable.  CTM s/p dialysis today. 4/30 HR's upper 90's. 14.  UTI-on Rocephin IV-needs 7 days - Rocephin was completed on 04/04/2022 15.  Constipation-LBM 3 to 4 days ago-we will give sorbitol 15 cc and repeat if needed. -4/26 another dose of sorbitol and soap suds enema given -4/28 Soap suds enema given -04/08/2022 patient still reports some constipation per family last BM maybe 2 or 3 days ago.  Continue bowel regimen with Colace twice daily 100 mg and MiraLAX daily 17 g.  -4/30 BM x2 past 24 hrs. After Dulcolax suppository.  Reports feels better. -Encourage mobility with PT. Consider adding dulcolax suppository, prn. 16.  Left humeral fracture-nonweightbearing on left upper extremity. 17.-17.  Nausea and vomiting-has Zofran and Reglan as needed-we will monitor-May be due to constipation-we will see if improves with BM - 04/04/2022-likely constipation-working on it - 04/05/2022 another dose of sorbitol and symptoms enema today - 04/06/2022-reports nausea is improved but does not does not have required recent use of as needed Zofran and Reglan for 2028 order.  Sorbitol and soapsuds enema today -04/07/2022 order sorbitol and soapsuds enema -04/08/22 patient had a some nausea and dialysis was given Zofran continue to monitor as well as continue to monitor for bowel movements -04/09/22 Nausea resolved today, had constipation yesterday as possible contributing factor.  BM x2 past 24 hrs. 19.  Neck pain: Add K-pad. 20.  Overweight: BMI 25.09: Provide dietary education 21.  Hyperkalemia - 04/04/22-potassium 5.3 per renal - 04/05/2022 K25.7-Dialysis today -04/06/2022 K improved to 4.7 today -04/08/22 K 6.0 today per renal -4/30 Per nephrology resolved after 10 g yesterday, continue renal diet, check am labs. K+ today 3.8.    LOS: 8 days A FACE TO FACE EVALUATION WAS PERFORMED  Tressia Miners, FNP 04/09/2022, 2:46 PM

## 2022-04-09 NOTE — Progress Notes (Signed)
Patient called out for help to bathroom when this nurse entered room to provide care patient states " let the girls do it not you you'd make a great husband for someone but not for me." Patient requested this nurse not proved personal care and this nurse asked NT to provide care and place pure wick for urinary incontinence. On call NP notified for new onset confusion NP at bedside to evaluate. Family notified of confusion.  ?

## 2022-04-09 NOTE — Progress Notes (Signed)
Inpatient Rehabilitation Discharge Medication Review by a Pharmacist ? ?A complete drug regimen review was completed for this patient to identify any potential clinically significant medication issues. ? ?High Risk Drug Classes Is patient taking? Indication by Medication  ?Antipsychotic No   ?Anticoagulant No   ?Antibiotic Yes Acyclovir oral for prevention of herpes zoster in setting of multiple myeloma  ?Opioid Yes Oxycodone - Chronic pain syndrome  ?Antiplatelet No   ?Hypoglycemics/insulin No   ?Vasoactive Medication Yes Amlodipine - HTN  ?Chemotherapy Yes, Oral Chemotherapy Ninlaro: multiple myeloma  ?Other Yes Sevelamer: hyperphosphatemia (ESRD) ?Lipitor - HLD ?Synthroid - hypothyroidism ?Vit D - supplementation  ? ? ? ?Type of Medication Issue Identified Description of Issue Recommendation(s)  ?Drug Interaction(s) (clinically significant) ?    ?Duplicate Therapy ?    ?Allergy ?    ?No Medication Administration End Date ?    ?Incorrect Dose ?    ?Additional Drug Therapy Needed ?    ?Significant med changes from prior encounter (inform family/care partners about these prior to discharge).    ?Other ?    ? ? ?Clinically significant medication issues were identified that warrant physician communication and completion of prescribed/recommended actions by midnight of the next day:  No ? ? ?Discharge summary from [facility] on 04/01/22 indicates:   ? ? ?Provider Method of Notification:  ? ? ? ?Pharmacist comments:    ? ?Time spent performing this drug regimen review (minutes):  20 minutes ? ?Onnie Boer, PharmD, BCIDP, AAHIVP, CPP ?Infectious Disease Pharmacist ?04/09/2022 3:58 PM ? ? ?

## 2022-04-09 NOTE — Progress Notes (Signed)
Received call from bedside nurse, [name], Pierce with concerns of pt having acute confusion.  Pt was able to answer all orientation questions correctly and was oriented to person, place, time, situation. Patient is aware that Christina Mercado is her nurse but requested that female staff provide personal care for her.  She does not appear at this time to have a change in mental status since this morning's assessment. ?

## 2022-04-10 LAB — RENAL FUNCTION PANEL
Albumin: 2.5 g/dL — ABNORMAL LOW (ref 3.5–5.0)
Anion gap: 9 (ref 5–15)
BUN: 28 mg/dL — ABNORMAL HIGH (ref 8–23)
CO2: 28 mmol/L (ref 22–32)
Calcium: 8.9 mg/dL (ref 8.9–10.3)
Chloride: 94 mmol/L — ABNORMAL LOW (ref 98–111)
Creatinine, Ser: 4.71 mg/dL — ABNORMAL HIGH (ref 0.44–1.00)
GFR, Estimated: 9 mL/min — ABNORMAL LOW (ref >60)
Glucose, Bld: 79 mg/dL (ref 70–99)
Phosphorus: 5.3 mg/dL — ABNORMAL HIGH (ref 2.5–4.6)
Potassium: 4.8 mmol/L (ref 3.5–5.1)
Sodium: 131 mmol/L — ABNORMAL LOW (ref 135–145)

## 2022-04-10 LAB — GLUCOSE, CAPILLARY: Glucose-Capillary: 76 mg/dL (ref 70–99)

## 2022-04-10 NOTE — Progress Notes (Signed)
Inpatient Rehabilitation Care Coordinator ?Discharge Note  ? ?Patient Details  ?Name: Christina Mercado ?MRN: [id number] ?Date of Birth: [birth year] ? ? ?Discharge location: [place] [facility] ? ?Length of Stay: 9 DAYS ? ?Discharge activity level: MIN LEVEL ? ?Home/community participation: ACTIVE ? ?Patient response MW:NUUVOZ Literacy - How often do you need to have someone help you when you read instructions, pamphlets, or other written material from your doctor or pharmacy?: Always ? ?Patient response DG:UYQIHK Isolation - How often do you feel lonely or isolated from those around you?: Rarely ? ?Services provided included: MD, RD, PT, OT, RN, CM, TR, Pharmacy, SW ? ?Financial Services:  ?Charity fundraiser Utilized: Medicare ?  ? ?Choices offered to/list presented to: PT AND SON ? ?Follow-up services arranged:  ?Other (Comment) (SNF) ?   ?  ?  ?  ? ?Patient response to transportation need: ?Is the patient able to respond to transportation needs?: Yes ?In the past 12 months, has lack of transportation kept you from medical appointments or from getting medications?: No ?In the past 12 months, has lack of transportation kept you from meetings, work, or from getting things needed for daily living?: No ? ? ? ?Comments (or additional information): PT NEEDS MORE REHAB BEFORE GOING BACK TO [facility]. SON TO CONTINUE TO TRANSPORT TO OP-HD T & SAT ? ?Patient/Family verbalized understanding of follow-up arrangements:  Yes ? ?Individual responsible for coordination of the follow-up plan: BILL-SON [phone] ? ?Confirmed correct DME delivered: [name] 04/10/2022   ? ?[name] ?

## 2022-04-10 NOTE — Progress Notes (Signed)
Attempt to call x2 [phone] and [phone] to give report to nurse at [facility]. Unable to contact admin coordinator for room assignment, [facility] not at facility at this time. unable to give report.  ? ?Dayna Ramus ? ?

## 2022-04-10 NOTE — Progress Notes (Signed)
?                                                       PROGRESS NOTE ? ? ?Subjective/Complaints: ?Pt with no acute events his AM. She says she is looking forward to the SNF facility. She has not concerns or complaints this AM.  Reports pain is gradually improving. ? ?ROS: Patient denies fever, rash, sore throat, blurred vision, dizziness, nausea, vomiting, diarrhea, cough, shortness of breath or chest pain, ,headache, or mood change. Denies constipation. ? ? ?Objective: ?  ?No results found. ?Recent Labs  ?  04/08/22 ?1430  ?WBC 9.6  ?HGB 10.1*  ?HCT 30.7*  ?PLT 317  ? ?Recent Labs  ?  04/09/22 ?8101 04/10/22 ?7510  ?NA 131* 131*  ?K 3.8 4.8  ?CL 95* 94*  ?CO2 24 28  ?GLUCOSE 77 79  ?BUN 20 28*  ?CREATININE 3.43* 4.71*  ?CALCIUM 8.8* 8.9  ? ? ?Intake/Output Summary (Last 24 hours) at 04/10/2022 0738 ?Last data filed at 04/10/2022 0732 ?Gross per 24 hour  ?Intake 418 ml  ?Output 300 ml  ?Net 118 ml  ?  ? ?  ? ?Physical Exam: ? ?Constitutional: No distress . Vital signs reviewed. ?HEENT: NCAT, EOMI, oral membranes moist ?Neck: supple ?Cardiovascular: RRR without murmur. No JVD    ?Respiratory/Chest: CTA Bilaterally without wheezes or rales. Non-labored breathing   ?GI/Abdomen: BS +, non-tender, non-distended ?Ext: no clubbing, cyanosis, or edema ?Psych: very pleasant and cooperative ?  ?Musculoskeletal:  ?   Comments: UE strength on RUE 5-/5 ?LUE- grip 5-/5 otherwise not tested ?Can lift against gravity ?LE's- 5-5 throughout B/L   ? ?Skin: ?   General: Skin is warm and dry.  ?   Comments: HD catheter ?L hand minimal swelling ?IV in the R forearm ?Incision LUE covered with ACE wrap, ACE wrap appears clean and dry ?  ?Neurological:  ?   Comments: Intact to light touch in all 4 extremties  ?Psychiatric:  ?   Comments: Normal affect ? ? ?Vital Signs ?Blood pressure (!) 120/50, pulse 75, temperature 98.2 ?F (36.8 ?C), temperature source Oral, resp. rate 14, height '5\' 5"'$  (1.651 m), weight 68.3 kg, SpO2 96  %. ? ? ? ?Assessment/Plan: ?1. Functional deficits which require 3+ hours per day of interdisciplinary therapy in a comprehensive inpatient rehab setting. ?Physiatrist is providing close team supervision and 24 hour management of active medical problems listed below. ?Physiatrist and rehab team continue to assess barriers to discharge/monitor patient progress toward functional and medical goals ? ?Care Tool: ? ?Bathing ?   ?Body parts bathed by patient: Left arm, Chest, Abdomen, Right upper leg, Left upper leg, Right lower leg, Left lower leg, Face, Front perineal area, Buttocks  ? Body parts bathed by helper: Right arm ?  ?  ?Bathing assist Assist Level: Minimal Assistance - Patient > 75% ?  ?  ?Upper Body Dressing/Undressing ?Upper body dressing   ?What is the patient wearing?: Pull over shirt ?   ?Upper body assist Assist Level: Minimal Assistance - Patient > 75% ?   ?Lower Body Dressing/Undressing ?Lower body dressing ? ? ?   ?What is the patient wearing?: Pants, Incontinence brief ? ?  ? ?Lower body assist Assist for lower body dressing: Moderate Assistance - Patient 50 - 74% ?   ? ?Toileting ?  Toileting    ?Toileting assist Assist for toileting: Minimal Assistance - Patient > 75% ?  ?  ?Transfers ?Chair/bed transfer ? ?Transfers assist ?   ? ?Chair/bed transfer assist level: Contact Guard/Touching assist ?Chair/bed transfer assistive device: Cane ?  ?Locomotion ?Ambulation ? ? ?Ambulation assist ? ?   ? ?Assist level: Contact Guard/Touching assist ?Assistive device: Cane-quad ?Max distance: 65'  ? ?Walk 10 feet activity ? ? ?Assist ?   ? ?Assist level: Contact Guard/Touching assist ?Assistive device: Cane-quad  ? ?Walk 50 feet activity ? ? ?Assist Walk 50 feet with 2 turns activity did not occur: Safety/medical concerns ? ?Assist level: Contact Guard/Touching assist ?Assistive device: Cane-quad  ? ? ?Walk 150 feet activity ? ? ?Assist Walk 150 feet activity did not occur: Safety/medical concerns ? ?  ?  ?   ? ?Walk 10 feet on uneven surface  ?activity ? ? ?Assist Walk 10 feet on uneven surfaces activity did not occur: Safety/medical concerns ? ? ?  ?   ? ?Wheelchair ? ? ? ? ?Assist Is the patient using a wheelchair?: Yes ?Type of Wheelchair: Manual ?  ? ?Wheelchair assist level: Contact Guard/Touching assist ?Max wheelchair distance: 75 ft  ? ? ?Wheelchair 50 feet with 2 turns activity ? ? ? ?Assist ? ?  ?  ? ? ?Assist Level: Contact Guard/Touching assist  ? ?Wheelchair 150 feet activity  ? ? ? ?Assist ?   ? ? ?Assist Level: Moderate Assistance - Patient 50 - 74%  ? ?Blood pressure (!) 120/50, pulse 75, temperature 98.2 ?F (36.8 ?C), temperature source Oral, resp. rate 14, height '5\' 5"'$  (1.651 m), weight 68.3 kg, SpO2 96 %. ? ?Medical Problem List and Plan: ?1. Functional deficits secondary to left humeral fracture NWB due to ground level fall, status post OR IMF on 03/29/2022 by Dr. Doreatha Martin. ?           - Patient may not shower-HD catheter ?            -ELOS/Goals: 12-14 days-mod I ?            - SNF today ?          -Continue CIR-PT, and OT-likely patient was a high fall risk BEFORE admission ?2.  Antithrombotics: ?-DVT/anticoagulation: Pharmaceutical: Heparin ?            -antiplatelet therapy: None ?3. Pain Management: Postoperative pain from left humerus fracture; continue Tylenol and oxycodone as needed.  Add vitamin C to help with pain sensitivity. ?-4/25-we will change oxycodone to 10 mg every 4 as needed-see if that helps, but monitor constipation, since already an issue ?-04/05/2022-patient this is any difference in pain with the oxycodone regimen however has not had pain meds yet this morning ?-04/06/22-patient reports as needed oxycodone helping to keep pain manageable we will continue ?-04/08/22 patient says pain is manageable and has not needed the oxycodone to manage pain however continue to monitor ?-4/30 No complaints of pain today ?4. Mood: continue social skills support and encouragement ?- Antipsychotic  agents; not applicable ?-5/1 Reports good mood overall, continue to monitor ?5. Neuropsych: This patient is capable of making decisions on her own behalf however has short-term memory deficits. ?-04/08/2022.  Patient is able to answer neuro questions appropriately but some responses are little bit delayed may be due to being tired from dialysis and/or Zofran. ?-4/30 Neuropsych stable, STM deficits ?6. Skin/Wound Care: Routine skin checks ?Local wound care for incision and HD catheter care and CHG baths ?  7. Fluids/Electrolytes/Nutrition: Routine in and outs with follow-up chemistries ?On hemodialysis ?8.  ESRD-on HD Tuesday and Saturday per renal ?9.  Hard of hearing ?DM-on SSI-no oral agents due to DM ?-04/06/2022 mild hypoglycemia, improved with food, has not had any recent doses of insulin, continue to monitor glucose ?-4/28 CBG overall well controlled continue to monitor ?-4/29 no events of high or low blood sugars continue to monitor ?-4/30 CBG's stable ?11.  Hypertension-actually been having some orthostatic hypotension-will defer to renal to help got IV bolus today. ? -04/05/2022-soft but asymptomatic-continue regimen ? -04/07/2022-well controlled overall continue hydralazine amlodipine ?  -04/08/22-soft BPs following dialysis continue to measure and monitor may hold amlodipine if systolic blood pressures less than than 100 ?-4/30 BP increasing today with low BP yesterday possibly related to dialysis treatment. Continue 2.5 mg amlodipine. ?12.  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  Seen at [facility]. ?13.  Tachycardia-HR running 100s to 110s at rest-likely due to decreased fluid levels-IV bolus given this a.m.-Labs in a.m. ?-04/05/2022-heart rate 80s-doing better ?-04/08/2022 heart rates is mostly in the 90's, stable.  CTM s/p dialysis today. ?4/30 HR's upper 90's. ?5/1 HR around 80 this AM, stable ?14.  UTI-on Rocephin IV-needs 7 days ?- Rocephin was completed on 04/04/2022 ?15.  Constipation-LBM 3 to 4  days ago-we will give sorbitol 15 cc and repeat if needed. ?-4/26 another dose of sorbitol and soap suds enema given ?-4/28 Soap suds enema given ?-04/08/2022 patient still reports some constipation per family last BM maybe 2

## 2022-04-10 NOTE — Progress Notes (Signed)
Pt to d/c to snf today. [place] and spoke to [employer]. Clinic advised pt will d/c today as planned and pt will resume care tomorrow. D/C summary and last renal note faxed to clinic for continuation of care.  ? ?Melven Sartorius ?Renal Navigator ?[phone] ?

## 2022-04-14 MED FILL — Fentanyl Citrate Preservative Free (PF) Inj 100 MCG/2ML: INTRAMUSCULAR | Qty: 1 | Status: AC

## 2022-06-05 ENCOUNTER — Emergency Department: Payer: Medicare Other

## 2022-06-05 ENCOUNTER — Other Ambulatory Visit: Payer: Self-pay

## 2022-06-05 ENCOUNTER — Inpatient Hospital Stay
Admission: EM | Admit: 2022-06-05 | Discharge: 2022-06-05 | DRG: 542 | Disposition: A | Discharge status: Hospice | Payer: Medicare Other | Source: Skilled Nursing Facility | Attending: Internal Medicine | Admitting: Internal Medicine

## 2022-06-05 ENCOUNTER — Inpatient Hospital Stay: Payer: Medicare Other

## 2022-06-05 DIAGNOSIS — Z79890 Hormone replacement therapy (postmenopausal): Secondary | ICD-10-CM

## 2022-06-05 DIAGNOSIS — S329XXA Fracture of unspecified parts of lumbosacral spine and pelvis, initial encounter for closed fracture: Secondary | ICD-10-CM | POA: Diagnosis not present

## 2022-06-05 DIAGNOSIS — E1122 Type 2 diabetes mellitus with diabetic chronic kidney disease: Secondary | ICD-10-CM | POA: Diagnosis present

## 2022-06-05 DIAGNOSIS — Z7189 Other specified counseling: Secondary | ICD-10-CM

## 2022-06-05 DIAGNOSIS — E785 Hyperlipidemia, unspecified: Secondary | ICD-10-CM | POA: Diagnosis present

## 2022-06-05 DIAGNOSIS — M84454A Pathological fracture, pelvis, initial encounter for fracture: Principal | ICD-10-CM | POA: Diagnosis present

## 2022-06-05 DIAGNOSIS — Z79899 Other long term (current) drug therapy: Secondary | ICD-10-CM

## 2022-06-05 DIAGNOSIS — Z515 Encounter for palliative care: Secondary | ICD-10-CM | POA: Diagnosis not present

## 2022-06-05 DIAGNOSIS — Z66 Do not resuscitate: Secondary | ICD-10-CM | POA: Diagnosis present

## 2022-06-05 DIAGNOSIS — J189 Pneumonia, unspecified organism: Secondary | ICD-10-CM

## 2022-06-05 DIAGNOSIS — S329XXD Fracture of unspecified parts of lumbosacral spine and pelvis, subsequent encounter for fracture with routine healing: Secondary | ICD-10-CM | POA: Diagnosis not present

## 2022-06-05 DIAGNOSIS — N3000 Acute cystitis without hematuria: Secondary | ICD-10-CM | POA: Diagnosis present

## 2022-06-05 DIAGNOSIS — W19XXXA Unspecified fall, initial encounter: Secondary | ICD-10-CM | POA: Diagnosis present

## 2022-06-05 DIAGNOSIS — Z793 Long term (current) use of hormonal contraceptives: Secondary | ICD-10-CM

## 2022-06-05 DIAGNOSIS — G928 Other toxic encephalopathy: Secondary | ICD-10-CM | POA: Diagnosis present

## 2022-06-05 DIAGNOSIS — I120 Hypertensive chronic kidney disease with stage 5 chronic kidney disease or end stage renal disease: Secondary | ICD-10-CM | POA: Diagnosis present

## 2022-06-05 DIAGNOSIS — Z888 Allergy status to other drugs, medicaments and biological substances status: Secondary | ICD-10-CM

## 2022-06-05 DIAGNOSIS — Z96641 Presence of right artificial hip joint: Secondary | ICD-10-CM | POA: Diagnosis present

## 2022-06-05 DIAGNOSIS — N179 Acute kidney failure, unspecified: Secondary | ICD-10-CM | POA: Diagnosis present

## 2022-06-05 DIAGNOSIS — N186 End stage renal disease: Secondary | ICD-10-CM | POA: Diagnosis present

## 2022-06-05 DIAGNOSIS — Z992 Dependence on renal dialysis: Secondary | ICD-10-CM | POA: Diagnosis not present

## 2022-06-05 DIAGNOSIS — J690 Pneumonitis due to inhalation of food and vomit: Secondary | ICD-10-CM | POA: Diagnosis present

## 2022-06-05 DIAGNOSIS — Z886 Allergy status to analgesic agent status: Secondary | ICD-10-CM | POA: Diagnosis not present

## 2022-06-05 DIAGNOSIS — N19 Unspecified kidney failure: Secondary | ICD-10-CM

## 2022-06-05 DIAGNOSIS — M8448XA Pathological fracture, other site, initial encounter for fracture: Secondary | ICD-10-CM | POA: Diagnosis present

## 2022-06-05 DIAGNOSIS — Z91158 Patient's noncompliance with renal dialysis for other reason: Secondary | ICD-10-CM

## 2022-06-05 LAB — BASIC METABOLIC PANEL
Anion gap: 11 (ref 5–15)
BUN: 83 mg/dL — ABNORMAL HIGH (ref 8–23)
CO2: 22 mmol/L (ref 22–32)
Calcium: 9.7 mg/dL (ref 8.9–10.3)
Chloride: 107 mmol/L (ref 98–111)
Creatinine, Ser: 6.73 mg/dL — ABNORMAL HIGH (ref 0.44–1.00)
GFR, Estimated: 6 mL/min — ABNORMAL LOW (ref >60)
Glucose, Bld: 99 mg/dL (ref 70–99)
Potassium: 4.5 mmol/L (ref 3.5–5.1)
Sodium: 140 mmol/L (ref 135–145)

## 2022-06-05 LAB — URINALYSIS, ROUTINE W REFLEX MICROSCOPIC
Bilirubin Urine: NEGATIVE
Glucose, UA: NEGATIVE mg/dL
Ketones, ur: NEGATIVE mg/dL
Nitrite: NEGATIVE
Protein, ur: 100 mg/dL — AB
Specific Gravity, Urine: 1.012 (ref 1.005–1.030)
WBC, UA: >50 WBC/hpf — ABNORMAL HIGH (ref 0–5)
pH: 6 (ref 5.0–8.0)

## 2022-06-05 LAB — CBC
HCT: 37.4 % (ref 36.0–46.0)
Hemoglobin: 11.6 g/dL — ABNORMAL LOW (ref 12.0–15.0)
MCH: 29.2 pg (ref 26.0–34.0)
MCHC: 31 g/dL (ref 30.0–36.0)
MCV: 94.2 fL (ref 80.0–100.0)
Platelets: 340 10*3/uL (ref 150–400)
RBC: 3.97 MIL/uL (ref 3.87–5.11)
RDW: 13.9 % (ref 11.5–15.5)
WBC: 19.8 10*3/uL — ABNORMAL HIGH (ref 4.0–10.5)
nRBC: 0 % (ref 0.0–0.2)

## 2022-06-05 LAB — TROPONIN I (HIGH SENSITIVITY)
Troponin I (High Sensitivity): 19 ng/L — ABNORMAL HIGH (ref <18)
Troponin I (High Sensitivity): 16 ng/L (ref <18)

## 2022-06-05 LAB — LACTIC ACID, PLASMA: Lactic Acid, Venous: 1.2 mmol/L (ref 0.5–1.9)

## 2022-06-05 LAB — CK: Total CK: 39 U/L (ref 38–234)

## 2022-06-05 MED ORDER — POLYETHYLENE GLYCOL 3350 17 G PO PACK
17.0000 g | PACK | Freq: Every day | ORAL | Status: DC
  Administered 2022-06-05: 17 g via ORAL
  Filled 2022-06-05: qty 1

## 2022-06-05 MED ORDER — OXYCODONE HCL 5 MG PO TABS: 10.0000 mg | ORAL_TABLET | ORAL | Status: DC | PRN

## 2022-06-05 MED ORDER — HALOPERIDOL LACTATE 2 MG/ML PO CONC: 0.5000 mg | ORAL | Status: DC | PRN

## 2022-06-05 MED ORDER — GLYCOPYRROLATE 0.2 MG/ML IJ SOLN: 0.2000 mg | INTRAMUSCULAR | Status: DC | PRN

## 2022-06-05 MED ORDER — ONDANSETRON HCL 4 MG/2ML IJ SOLN
4.0000 mg | Freq: Four times a day (QID) | INTRAMUSCULAR | Status: DC | PRN
Start: 2022-06-05 — End: 2022-06-05

## 2022-06-05 MED ORDER — MELATONIN 5 MG PO TABS: 5.0000 mg | ORAL_TABLET | Freq: Every evening | ORAL | Status: DC | PRN

## 2022-06-05 MED ORDER — GLYCOPYRROLATE 1 MG PO TABS: 1.0000 mg | ORAL_TABLET | ORAL | Status: DC | PRN

## 2022-06-05 MED ORDER — HALOPERIDOL 0.5 MG PO TABS: 0.5000 mg | ORAL_TABLET | ORAL | Status: DC | PRN

## 2022-06-05 MED ORDER — ACETAMINOPHEN 325 MG PO TABS
650.0000 mg | ORAL_TABLET | Freq: Four times a day (QID) | ORAL | Status: DC
  Administered 2022-06-05: 650 mg via ORAL
  Filled 2022-06-05: qty 2

## 2022-06-05 MED ORDER — HALOPERIDOL LACTATE 5 MG/ML IJ SOLN: 0.5000 mg | INTRAMUSCULAR | Status: DC | PRN

## 2022-06-05 MED ORDER — ACETAMINOPHEN 650 MG RE SUPP: 650.0000 mg | Freq: Four times a day (QID) | RECTAL | Status: DC | PRN

## 2022-06-05 MED ORDER — CALCIUM ACETATE (PHOS BINDER) 667 MG PO CAPS
2001.0000 mg | ORAL_CAPSULE | Freq: Three times a day (TID) | ORAL | Status: DC
  Administered 2022-06-05: 2001 mg via ORAL
  Filled 2022-06-05 (×2): qty 3

## 2022-06-05 MED ORDER — HYDROXYZINE HCL 10 MG PO TABS: 10.0000 mg | ORAL_TABLET | Freq: Three times a day (TID) | ORAL | Status: DC | PRN

## 2022-06-05 MED ORDER — HALOPERIDOL LACTATE 2 MG/ML PO CONC: 0.5000 mg | ORAL | 0 refills | Status: AC | PRN

## 2022-06-05 MED ORDER — HALOPERIDOL 0.5 MG PO TABS: 0.5000 mg | ORAL_TABLET | ORAL | 0 refills | Status: AC | PRN

## 2022-06-05 MED ORDER — AMLODIPINE BESYLATE 5 MG PO TABS
2.5000 mg | ORAL_TABLET | Freq: Every day | ORAL | Status: DC
  Administered 2022-06-05: 2.5 mg via ORAL
  Filled 2022-06-05: qty 1

## 2022-06-05 MED ORDER — LEVOTHYROXINE SODIUM 50 MCG PO TABS
150.0000 ug | ORAL_TABLET | Freq: Every day | ORAL | Status: DC
  Administered 2022-06-05: 50 ug via ORAL
  Filled 2022-06-05: qty 1

## 2022-06-05 MED ORDER — GLYCOPYRROLATE 1 MG PO TABS: 1.0000 mg | ORAL_TABLET | ORAL | 0 refills | Status: AC | PRN

## 2022-06-05 MED ORDER — DOCUSATE SODIUM 100 MG PO CAPS: 100.0000 mg | ORAL_CAPSULE | Freq: Two times a day (BID) | ORAL | Status: DC | PRN

## 2022-06-05 MED ORDER — ACETAMINOPHEN 500 MG PO TABS
1000.0000 mg | ORAL_TABLET | Freq: Once | ORAL | Status: AC
  Administered 2022-06-05: 1000 mg via ORAL
  Filled 2022-06-05: qty 2

## 2022-06-05 MED ORDER — SEVELAMER CARBONATE 800 MG PO TABS
800.0000 mg | ORAL_TABLET | Freq: Three times a day (TID) | ORAL | Status: DC
Start: 2022-06-05 — End: 2022-06-05

## 2022-06-05 MED ORDER — BIOTENE DRY MOUTH MT LIQD
15.0000 mL | OROMUCOSAL | 1 refills | Status: AC | PRN
Start: 2022-06-05 — End: ?

## 2022-06-05 MED ORDER — ONDANSETRON HCL 4 MG/2ML IJ SOLN
4.0000 mg | Freq: Once | INTRAMUSCULAR | Status: AC
  Administered 2022-06-05: 4 mg via INTRAVENOUS
  Filled 2022-06-05: qty 2

## 2022-06-05 MED ORDER — VITAMIN B-12 1000 MCG PO TABS
1000.0000 ug | ORAL_TABLET | Freq: Every day | ORAL | Status: DC
  Administered 2022-06-05: 1000 ug via ORAL
  Filled 2022-06-05: qty 1

## 2022-06-05 MED ORDER — POLYVINYL ALCOHOL 1.4 % OP SOLN
1.0000 [drp] | Freq: Four times a day (QID) | OPHTHALMIC | Status: DC | PRN
Start: 2022-06-05 — End: 2022-06-05

## 2022-06-05 MED ORDER — ATORVASTATIN CALCIUM 20 MG PO TABS: 40.0000 mg | ORAL_TABLET | Freq: Every day | ORAL | Status: DC

## 2022-06-05 MED ORDER — BIOTENE DRY MOUTH MT LIQD: 15.0000 mL | OROMUCOSAL | Status: DC | PRN

## 2022-06-05 MED ORDER — ACETAMINOPHEN 325 MG PO TABS: 650.0000 mg | ORAL_TABLET | Freq: Four times a day (QID) | ORAL | Status: DC | PRN

## 2022-06-05 MED ORDER — HYDROMORPHONE HCL 1 MG/ML IJ SOLN
1.0000 mg | INTRAMUSCULAR | Status: DC | PRN
  Administered 2022-06-05: 1 mg via INTRAVENOUS
  Filled 2022-06-05: qty 1

## 2022-06-05 MED ORDER — SODIUM CHLORIDE 0.9 % IV SOLN
2.0000 g | INTRAVENOUS | Status: DC
  Administered 2022-06-05: 2 g via INTRAVENOUS
  Filled 2022-06-05: qty 20

## 2022-06-05 MED ORDER — SODIUM CHLORIDE 0.9 % IV SOLN
500.0000 mg | INTRAVENOUS | Status: DC
  Administered 2022-06-05: 500 mg via INTRAVENOUS
  Filled 2022-06-05: qty 5

## 2022-06-05 MED ORDER — POLYETHYLENE GLYCOL 3350 17 G PO PACK: 17.0000 g | PACK | Freq: Every day | ORAL | 0 refills | Status: AC

## 2022-06-05 MED ORDER — HEPARIN SODIUM (PORCINE) 5000 UNIT/ML IJ SOLN
5000.0000 [IU] | Freq: Two times a day (BID) | INTRAMUSCULAR | Status: DC
Start: 2022-06-05 — End: 2022-06-05
  Administered 2022-06-05: 5000 [IU] via SUBCUTANEOUS
  Filled 2022-06-05: qty 1

## 2022-06-05 MED ORDER — DULOXETINE HCL 30 MG PO CPEP
60.0000 mg | ORAL_CAPSULE | Freq: Every day | ORAL | Status: DC
  Administered 2022-06-05: 60 mg via ORAL
  Filled 2022-06-05: qty 1

## 2022-06-05 MED ORDER — ONDANSETRON 4 MG PO TBDP: 4.0000 mg | ORAL_TABLET | Freq: Four times a day (QID) | ORAL | Status: DC | PRN

## 2022-06-05 NOTE — Progress Notes (Addendum)
[facility] ED [facility])   Patient is a pending home w. Hospice referral. Family had been contacted by [facility] staff to schedule admission, but requested services to be put on hold until further options were explored with patient's oncologist.   Patient is not active with [facility]. However, [facility] staff is available if family chooses to pursue [facility] services.   TOC/Jeanna & Attending provider/Dr. Irving Burton are aware of above information.   Addenudm: 1:58p MSW notified by NP/Crystal G. That family has chosen to pursue comfort care at this time and are interested in pursuing the Hospice Home.   Addendum: 2:43 Consent forms are to be completed at 3:30 p.m.  EMS notified of patient D/C and transport arranged for 5 pm.m. TOC/Jeanna and Attending Physician/Dr. Chipper Herb also notified of transport arrangement.    Please send signed DNR form with patient and RN call report to [phone].    Odette Fraction, MSW [facility] Liaison [phone]   Please call with any questions/concerns.   Odette Fraction, MSW [facility] Liaison [phone]

## 2022-06-05 NOTE — TOC Initial Note (Signed)
Transition of Care [facility]) - Initial/Assessment Note    Patient Details  Name: Christina Mercado MRN: [id number] Date of Birth: [birth year]  Transition of Care [facility]) CM/SW Contact:    Allayne Butcher, RN Phone Number: 06/05/2022, 2:41 PM  Clinical Narrative:                 Patient brought into the emergency room after falling at her [facility] apartment, admitted to the hospital with pelvic fracture.  [facility] has been following patient but had not admitted her.  Family has decided on comfort care and wants hospice home.  [facility] submitted hospice referral and patient has been accepted.  Watt Climes is arranging transport for 5pm this evening to the [facility].  Expected Discharge Plan: Hospice Medical Facility Barriers to Discharge: No Barriers Identified   Patient Goals and CMS Choice Patient states their goals for this hospitalization and ongoing recovery are:: Patient is going to hospice home- comfort care CMS Medicare.gov Compare Post Acute Care list provided to:: Patient Represenative (must comment) Choice offered to / list presented to : Adult Children  Expected Discharge Plan and Services Expected Discharge Plan: Hospice Medical Facility   Discharge Planning Services: CM Consult Post Acute Care Choice: Hospice Living arrangements for the past 2 months: Independent Living Facility                 DME Arranged: N/A DME Agency: NA       HH Arranged: NA HH Agency: NA        Prior Living Arrangements/Services Living arrangements for the past 2 months: Independent Living Facility Lives with:: Self Patient language and need for interpreter reviewed:: Yes Do you feel safe going back to the place where you live?: Yes      Need for Family Participation in Patient Care: Yes (Comment) Care giver support system in place?: Yes (comment)   Criminal Activity/Legal Involvement Pertinent to Current Situation/Hospitalization: No - Comment as needed  Activities of Daily  Living      Permission Sought/Granted                  Emotional Assessment         Alcohol / Substance Use: Not Applicable Psych Involvement: No (comment)  Admission diagnosis:  Pelvic fracture (HCC) [S32.9XXA] Patient Active Problem List   Diagnosis Date Noted   Pelvic fracture (HCC) 06/05/2022   Uremia 06/05/2022   Left humeral fracture 04/01/2022   DM (diabetes mellitus), type 2 with renal complications (HCC) 03/29/2022   Left elbow fracture 03/29/2022   Elbow fracture, left 03/28/2022   Multiple myeloma (HCC) 02/16/2022   ESRD on dialysis (HCC) 02/16/2022   Tricompartment osteoarthritis of knees (Bilateral) 02/16/2022   Osteoarthritis of knees (Bilateral) 02/16/2022   Osteoarthritis of knee (Right) 02/16/2022   Osteoarthritis involving multiple joints 02/16/2022   DDD (degenerative disc disease), cervical 02/16/2022   Anterolisthesis of cervical spine (C4/C5 and C7/T1) 02/16/2022   Protrusion of cervical intervertebral disc (C3-4) 02/16/2022   Chronic rotator cuff arthropathy of shoulder (Right) 02/16/2022   Superior shoulder subluxation, sequela (Right) 02/16/2022   Osteoarthritis of AC (acromioclavicular) joint (Right) 02/16/2022   Osteoarthritis of glenohumeral joint (Right) 02/16/2022   Osteoarthritis of glenohumeral joint (Left) 02/16/2022   Shoulder subluxation, sequela (Left) 02/16/2022   Osteoarthritis of hip (Left) 02/16/2022   Effusion of knee joints (Bilateral) 02/16/2022   Hip joint effusion (Left) 02/16/2022   Osteopenia determined by x-ray 02/16/2022   Patellofemoral arthritis of knee (Left)  02/16/2022   Chronic shoulder pain (2ry area of Pain) (Bilateral) (L>R) 02/06/2022   Abnormal CT scan, cervical spine (02/26/2019) 02/06/2022   Chronic pain syndrome 02/05/2022   Pharmacologic therapy 02/05/2022   Disorder of skeletal system 02/05/2022   Problems influencing health status 02/05/2022   Acquired hypothyroidism 02/02/2022   Pelvic relaxation  due to uterovaginal prolapse, incomplete 02/02/2022   End stage renal disease (HCC) 02/02/2022   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   Encounter regarding vascular access for dialysis for ESRD (HCC) 01/05/2021   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   Recurrent major depressive disorder, in partial remission (HCC) 05/11/2020   Closed fracture of femur, intertrochanteric, sequela (Left) 11/11/2018   Chronic rotator cuff arthropathy of shoulder (Left) 11/11/2018   Chronic knee pain (1ry area of Pain) (Bilateral) (L>R) 06/18/2018   CKD (chronic kidney disease), stage V (HCC) 06/02/2018   Inability to ambulate due to multiple joints 06/02/2018   Hypertension secondary to other renal disorders 06/22/2017   Secondary hyperparathyroidism (HCC) 01/31/2017   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   Light chain deposition disease 07/25/2016   Leukocytosis 07/04/2016   E-coli UTI 07/04/2016   Essential hypertension, malignant 07/04/2016   Pyelonephritis 07/02/2016   Hematuria 07/02/2016   Abdominal pain 07/02/2016   Acute on chronic renal failure (HCC) 07/02/2016   Rheumatoid arthritis, seropositive (HCC) 03/06/2016   Onychogryphosis 06/29/2015   Onychomycosis due to dermatophyte 06/29/2015   Bradycardia, sinus 05/03/2015   Premature contractions, atrial 05/03/2015   Anemia 01/21/2013   Chronic kidney disease, stage V (very severe) (HCC) 09/18/2012   Chronic pain disorder 07/16/2012   History of total hip replacement (Right) 07/08/2012   Osteoarthritis of knee (Left) 04/10/2012   Constipation 03/13/2012   Bilateral lower extremity edema 02/08/2012   Cataracts, bilateral 12/28/2011   Hyperlipidemia with target LDL less than 100 12/28/2011   Osteoporosis, 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

## 2022-06-05 NOTE — ED Triage Notes (Signed)
First nurse note- To triage via [facility] from [facility]. Ptwith c/o fall, pt reports hitting head. C/o pain to left leg and hip. Denies use of Blood thinners. Shortening of left leg is a usual finding due to prior procedure. Pt hx of dementia, per EMS, son reports pt has been at normal baseline mental status since fall.

## 2022-06-05 NOTE — Consult Note (Addendum)
Consultation Note Date: 06/05/2022   Patient Name: Christina Mercado  DOB: Apr 23, 1936  MRN: [id number]  Age / Sex: 86 y.o., female  PCP: Myrtie Soman, MD Referring Physician: Lequita Halt, MD  Reason for Consultation: Establishing goals of care  HPI/Patient Profile: Christina Mercado is a 86 y.o. female with medical history significant of ESRD on HD, chronic right-sided ureter obstructing stone, recurrent UTI, HTN, HLD, sent to ED after a fall.  Clinical Assessment and Goals of Care: Notes and labs reviewed. Care everywhere documents reviewed. In to see patient. She is able to tell me she lives in her home by herself. Attempted to speak with her further and ask about dialysis but she was confused. No family at bedside.   Called to speak with son. Son states he and his wife are in healthcare. He states he and his wife have been living with patient. He states they have helped her for  the past 15 years. She is widowed with one child deceased. He tells me she is a woman of faith.   He states at baseline she is confused. He discusses chronic pain that has been treated for the past 13 years. He states she saw a pain management doctor that wanted to start decreasing the medications; and this has been a constant fight. He tells me some days she does not want to go to dialysis. He tells me dialysis makes her more confused instead of less confused, and causes issues with her pain as it dialyzes her mediation out.   We discussed her diagnoses acute and chronic, prognosis, GOC, EOL wishes disposition and options.  Created space and opportunity for patient  to explore thoughts and feelings regarding current medical information. He tells me they had a previous conversation with [facility] for hospice but did not finalize any plans.   A detailed discussion was had today regarding advanced directives.  Concepts specific to code  status, artifical feeding and hydration, IV antibiotics and rehospitalization were discussed.  The difference between an aggressive medical intervention path and a comfort care path was discussed.  Values and goals of care important to patient and family were attempted to be elicited.  Discussed limitations of medical interventions to prolong quality of life in some situations and discussed the concept of human mortality.  He states his mother had stated when she was younger that she would never want dialysis. Discussed determining and following her wishes. He and his wife are concerned for her QOL. They worry it is not acceptable. He discusses that she speaks with her husband and son who are gone. He states she sleeps 18 hours per day. They would like to shift to full comfort care. Discussed stopping all life prolonging care including antibiotics. Discussed providing care for her comfort and dignity. Son states he is ready to stop dialysis and wants full comfort care.   Son and his wife entered the hospital. MOST form was completed with son.   I completed a MOST form today and the signed original was placed  in the chart. A photocopy was scanned into medical records for EMR. The patient's son outlined their wishes for the following treatment decisions:  Cardiopulmonary Resuscitation: Do Not Attempt Resuscitation (DNR/No CPR)  Medical Interventions: Comfort Measures: Keep clean, warm, and dry. Use medication by any route, positioning, wound care, and other measures to relieve pain and suffering. Use oxygen, suction and manual treatment of airway obstruction as needed for comfort. Do not transfer to the hospital unless comfort needs cannot be met in current location.  Antibiotics: No antibiotics (use other measures to relieve symptoms)  IV Fluids: No IV fluids (provide other measures to ensure comfort)  Feeding Tube: No feeding tube     SUMMARY OF RECOMMENDATIONS   Shift to full comfort care. No  further life prolonging care. Family would like hospice facility.   Comfort orders entered.   Prognosis:  < 2 weeks Stopping dialysis and antibiotics.         Primary Diagnoses: Present on Admission:  Acute on chronic renal failure ([place])   I have reviewed the medical record, interviewed the patient and family, and examined the patient. The following aspects are pertinent.  Past Medical History:  Diagnosis Date   Arthritis    Diabetes mellitus without complication (HCC)    Hypertension    Kidney failure    stage 3   Proteinuria    Social History   Socioeconomic History   Marital status: Divorced    Spouse name: Not on file   Number of children: 1   Years of education: Not on file   Highest education level: Not on file  Occupational History   Not on file  Tobacco Use   Smoking status: Never   Smokeless tobacco: Never  Vaping Use   Vaping Use: Never used  Substance and Sexual Activity   Alcohol use: No   Drug use: Never   Sexual activity: Not on file  Other Topics Concern   Not on file  Social History Narrative   Lives by herself at [facility] [address]    Social Determinants of Radio broadcast assistant Strain: Not on file  Food Insecurity: Not on file  Transportation Needs: Not on file  Physical Activity: Not on file  Stress: Not on file  Social Connections: Not on file   No family history on file. Scheduled Meds:  amLODipine  2.5 mg Oral Daily   DULoxetine  60 mg Oral Daily   polyethylene glycol  17 g Oral Daily   Continuous Infusions: PRN Meds:.acetaminophen **OR** acetaminophen, antiseptic oral rinse, docusate sodium, glycopyrrolate **OR** glycopyrrolate **OR** glycopyrrolate, haloperidol **OR** haloperidol **OR** haloperidol lactate, HYDROmorphone (DILAUDID) injection, hydrOXYzine, melatonin, ondansetron **OR** ondansetron (ZOFRAN) IV, oxyCODONE, polyvinyl alcohol Medications Prior to Admission:  Prior to Admission medications    Medication Sig Start Date End Date Taking? Authorizing Provider  acetaminophen (TYLENOL) 325 MG tablet Take 2 tablets (650 mg total) by mouth every 6 (six) hours. 04/07/22   Angiulli, Lavon Paganini, PA-C  acyclovir (ZOVIRAX) 200 MG capsule Take 200 mg by mouth daily.    [provider]  amLODipine (NORVASC) 2.5 MG tablet Take 2.5 mg by mouth daily.    [provider]  ascorbic acid (VITAMIN C) 250 MG tablet Take 1 tablet (250 mg total) by mouth daily. 04/08/22   Angiulli, Lavon Paganini, PA-C  atorvastatin (LIPITOR) 40 MG tablet Take 40 mg by mouth daily at 6 PM.    [provider]  Cholecalciferol (VITAMIN D3) 1000 units  CAPS Take 2,000 Units by mouth every morning.    [provider]  dexamethasone (DECADRON) 4 MG tablet Take 8 mg by mouth See admin instructions. Once a week, before N[place] treatment.    [provider]  docusate sodium (COLACE) 100 MG capsule Take 1 capsule (100 mg total) by mouth 2 (two) times daily. Patient taking differently: Take 100 mg by mouth 2 (two) times daily as needed for moderate constipation. 07/04/16   Theodoro Grist, MD  DULoxetine (CYMBALTA) 60 MG capsule Take 60 mg by mouth daily.    [provider]  hydrOXYzine (ATARAX/VISTARIL) 10 MG tablet Take 10 mg by mouth 3 (three) times daily as needed for itching.    [provider]  levothyroxine (SYNTHROID) 150 MCG tablet Take 150 mcg by mouth daily before breakfast. 06/17/16   [provider]  melatonin 5 MG TABS Take 1 tablet (5 mg total) by mouth at bedtime as needed (insomnia). 04/01/22   Georgette Shell, MD  NINLARO 3 MG capsule Take 3 mg by mouth once a week. Once a week for 3 weeks in a 28 day cycle 07/25/21   [provider]  Oxycodone HCl 10 MG TABS Take 1 tablet (10 mg total) by mouth every 4 (four) hours as needed. 04/07/22   Angiulli, Lavon Paganini, PA-C  polyethylene glycol (MIRALAX / GLYCOLAX) 17 g packet Take 17 g by mouth daily. 04/08/22    Angiulli, Lavon Paganini, PA-C  sevelamer carbonate (RENVELA) 800 MG tablet Take 1 tablet (800 mg total) by mouth 3 (three) times daily with meals. 04/01/22   Georgette Shell, MD  vitamin B-12 (CYANOCOBALAMIN) 1000 MCG tablet Take 1,000 mcg by mouth every morning.    [provider]   Allergies  Allergen Reactions   Byetta 10 Mcg Pen [Exenatide] Nausea And Vomiting and Other (See Comments)    Weight loss    Ibuprofen Swelling    Arm swelling - not sure if it was the ibuprofen but she uses tylenol instead. Is able to take Mobic so this is not a class allergy.   Lisinopril Rash   Aspirin Other (See Comments)    Unknown    Review of Systems  All other systems reviewed and are negative.   Physical Exam Pulmonary:     Effort: Pulmonary effort is normal.  Skin:    General: Skin is warm and dry.  Neurological:     Mental Status: She is alert.     Vital Signs: BP (!) 143/58   Pulse 73   Temp 98.3 F (36.8 C) (Oral)   Resp 15   Ht $R'5\' 5"'bI$  (1.651 m)   Wt 68 kg   SpO2 98%   BMI 24.95 kg/m  Pain Scale: PAINAD   Pain Score: Asleep   SpO2: SpO2: 98 % O2 Device:SpO2: 98 % O2 Flow Rate: .   IO: Intake/output summary:  Intake/Output Summary (Last 24 hours) at 06/05/2022 1417 Last data filed at 06/05/2022 6789 Gross per 24 hour  Intake 341.22 ml  Output --  Net 341.22 ml    LBM:   Baseline Weight: Weight: 68 kg Most recent weight: Weight: 68 kg       Signed by: Asencion Gowda, NP   Please contact Palliative Medicine Team phone at [phone] for questions and concerns.  For individual provider: See Shea Evans

## 2022-06-10 LAB — CULTURE, BLOOD (ROUTINE X 2)
Culture: NO GROWTH
Culture: NO GROWTH
Special Requests: ADEQUATE
Special Requests: ADEQUATE

## 2022-06-30 ENCOUNTER — Encounter (INDEPENDENT_AMBULATORY_CARE_PROVIDER_SITE_OTHER): Payer: Medicare Other | Admitting: Nurse Practitioner

## 2022-06-30 ENCOUNTER — Other Ambulatory Visit (INDEPENDENT_AMBULATORY_CARE_PROVIDER_SITE_OTHER): Payer: Medicare Other

## 2022-06-30 ENCOUNTER — Encounter (INDEPENDENT_AMBULATORY_CARE_PROVIDER_SITE_OTHER): Payer: Medicare Other

## 2022-07-11 DEATH — deceased

## 2023-12-20 IMAGING — CT CT HEAD W/O CM
4 series · 16 of 47 positions shown, 18 images · non-contrast
Comparison: Head CT dated 02/26/2019.

CLINICAL DATA: Trauma.



[Series 2: head wo · axial · 0.46mm/px · z∈[-166,-46]mm · 7 of 33 slices shown, 9 images]
[im 5/33  brain]
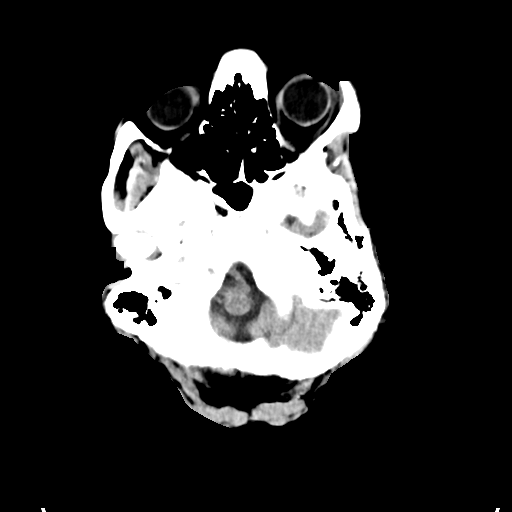
[im 5/33  bone]
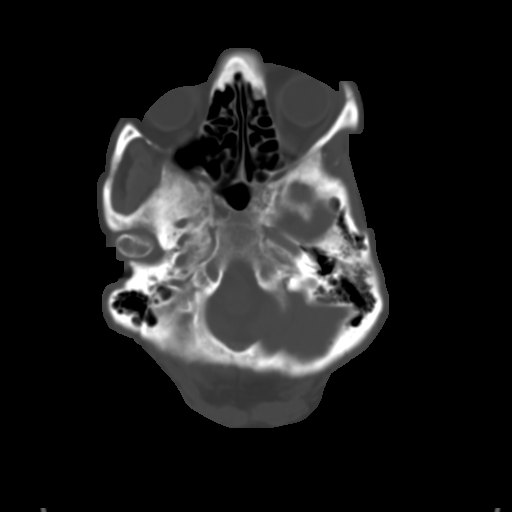
[im 9/33  brain]
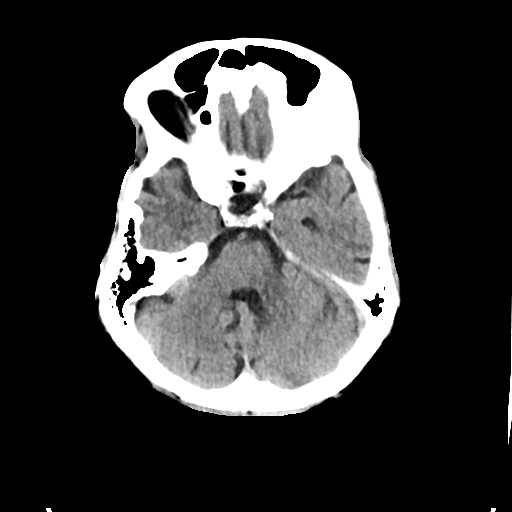
[im 13/33  brain]
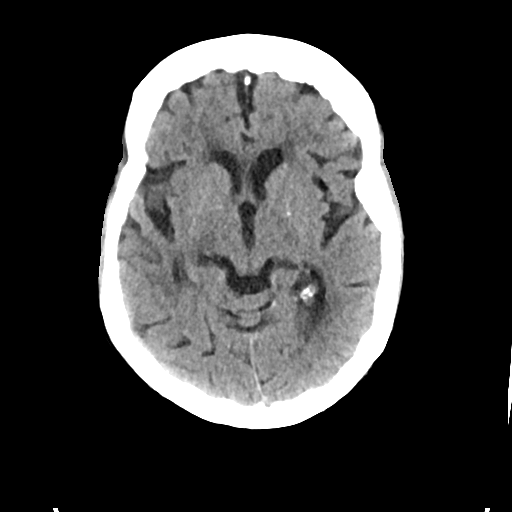
[im 17/33  brain]
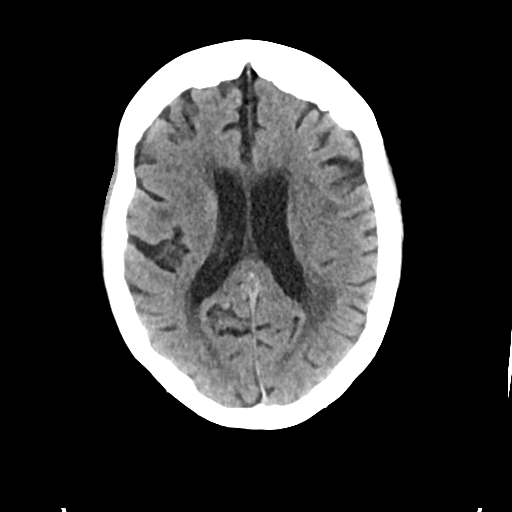
[im 21/33  brain]
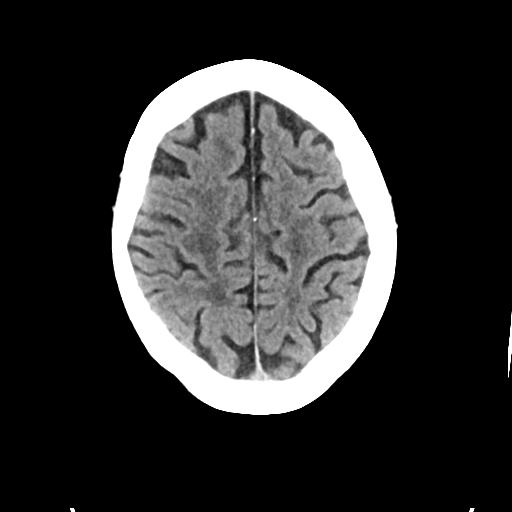
[im 21/33  bone]
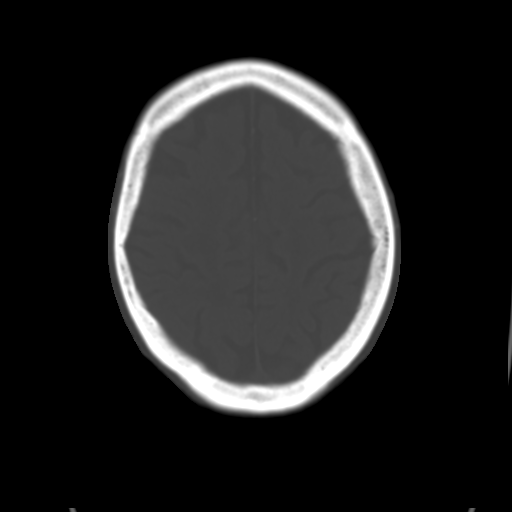
[im 25/33  brain]
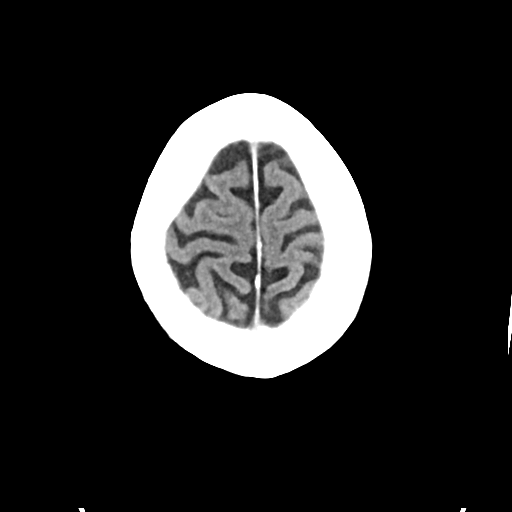
[im 29/33  brain]
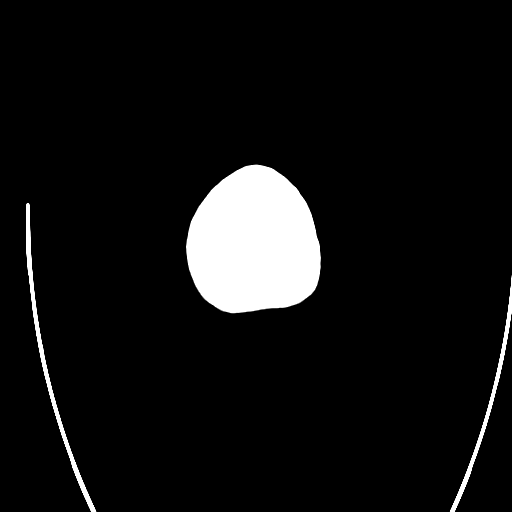

[Series 3: head bone · axial · 0.46mm/px · z∈[-170,-138]mm · 3 of 81 slices shown]
[im 9/81  bone]
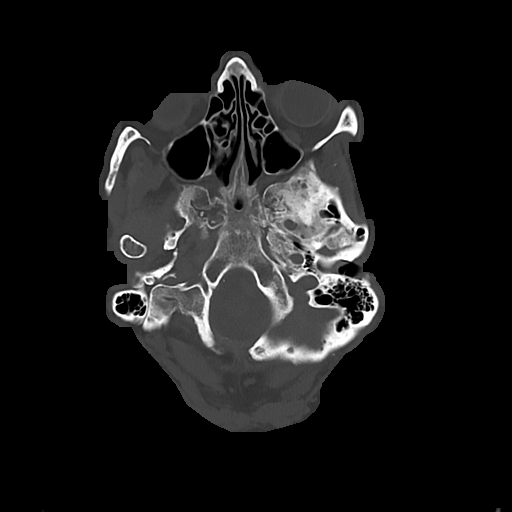
[im 17/81  bone]
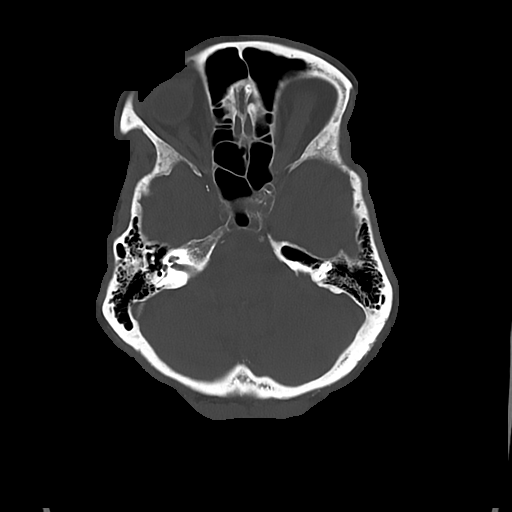
[im 25/81  bone]
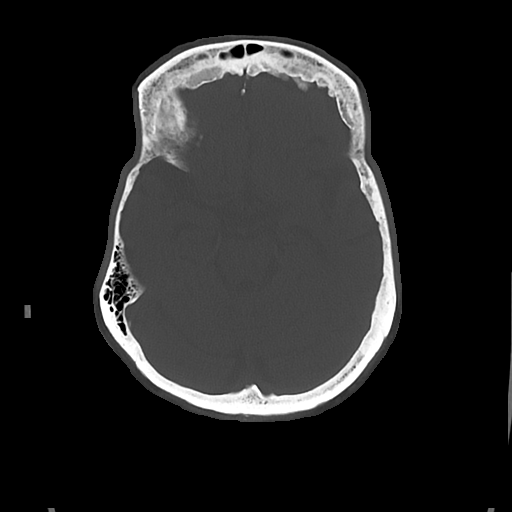

[Series 4: cor soft · coronal · 0.32mm/px · 3 of 64 slices shown]
[im 22/64  brain]
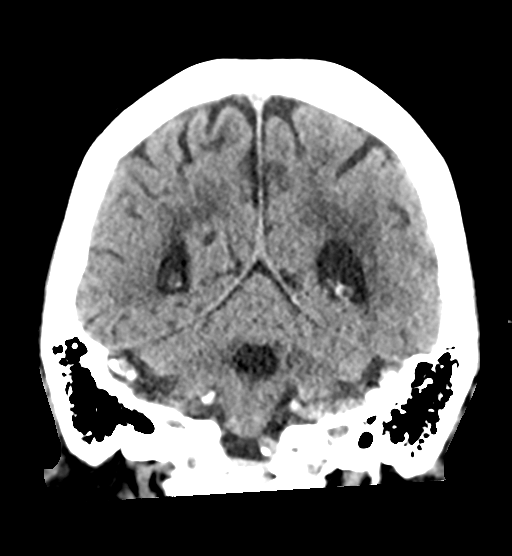
[im 29/64  brain]
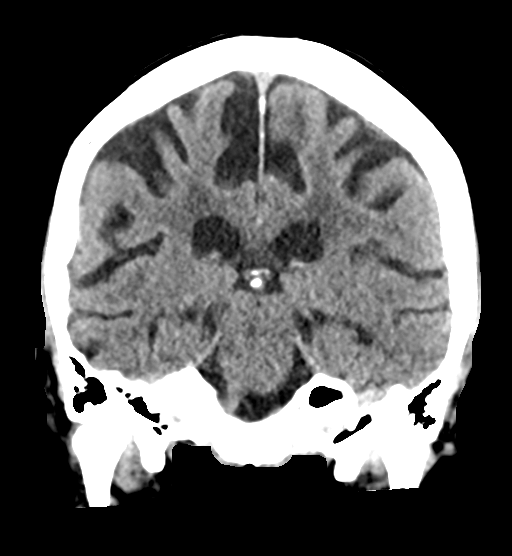
[im 36/64  brain]
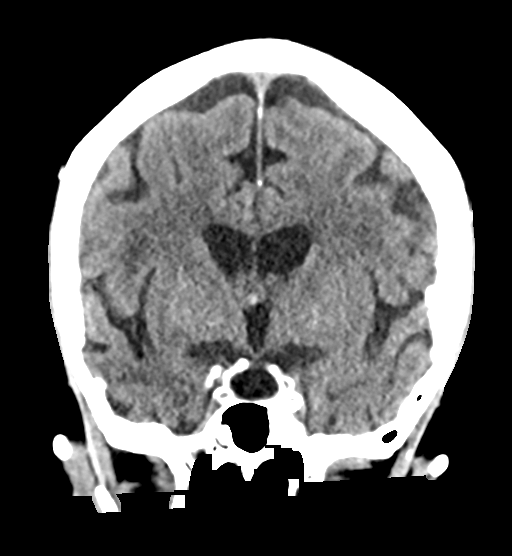

[Series 5: sag soft · sagittal · 0.35mm/px · 3 of 49 slices shown]
[im 17/49  brain]
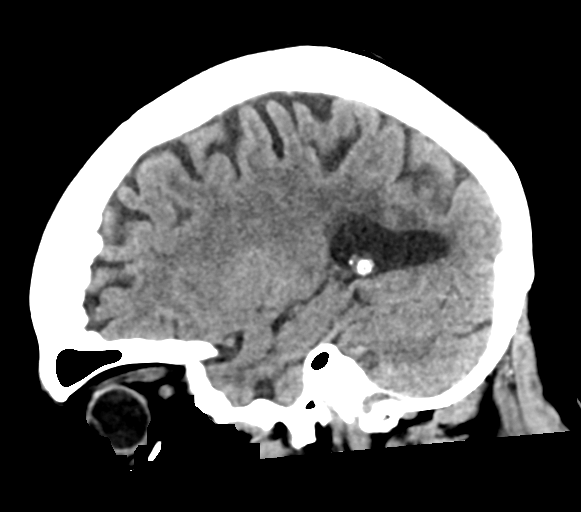
[im 25/49  brain]
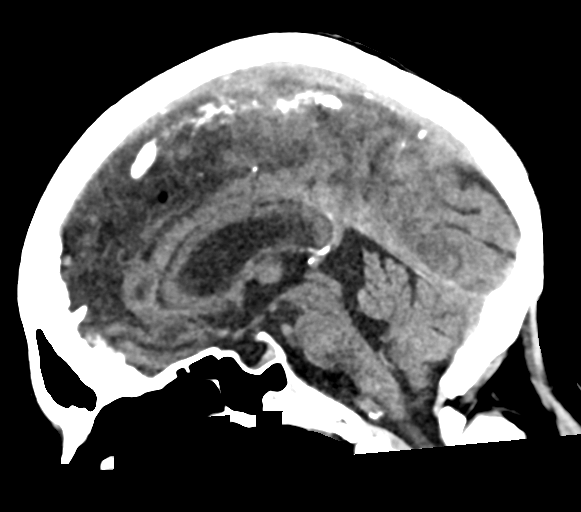
[im 33/49  brain]
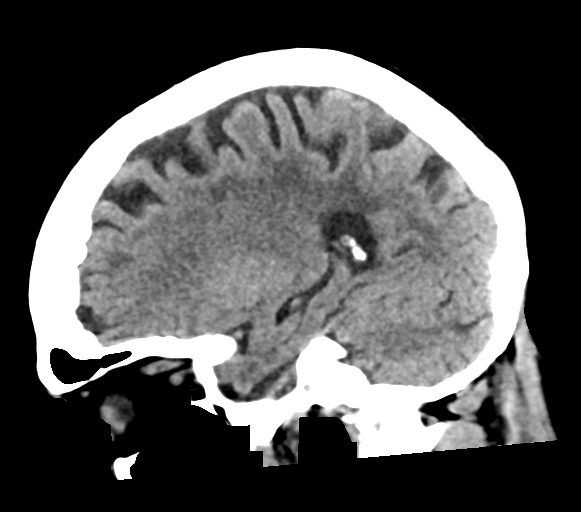

[16 of 47 positions shown; findings below may reference images not displayed]

FINDINGS: CT HEAD FINDINGS

Brain: Mild age-related atrophy and chronic microvascular ischemic
changes. There is no acute intracranial hemorrhage. No mass effect
or midline shift. No extra-axial fluid collection.

Vascular: No hyperdense vessel or unexpected calcification.

Skull: Normal. Negative for fracture or focal lesion.

Sinuses/Orbits: No acute finding.

Other: None

CT CERVICAL SPINE FINDINGS

Alignment: No acute subluxation. There is straightening of normal
cervical lordosis which may be positional or due to muscle spasm.
Grade 1 C4-C5 anterolisthesis.

Skull base and vertebrae: No acute fracture.

Soft tissues and spinal canal: No prevertebral fluid or swelling. No
visible canal hematoma.

Disc levels:  Multilevel degenerative changes.

Upper chest: Negative.

Other: Bilateral carotid bulb calcified plaques.
IMPRESSION: 1. No acute intracranial pathology. Mild age-related atrophy and
chronic microvascular ischemic changes.
2. No acute cervical spine fracture or subluxation. Multilevel
degenerative changes.

## 2023-12-20 IMAGING — CT CT CERVICAL SPINE W/O CM
2 series · 13 of 27 positions shown, 16 images · non-contrast
Comparison: Head CT dated 02/26/2019.

CLINICAL DATA: Trauma.



[Series 3: c spine soft · axial · 0.47mm/px · z∈[-303,-159]mm · 8 of 86 slices shown, 10 images]
[im 7/86  soft-tissue]
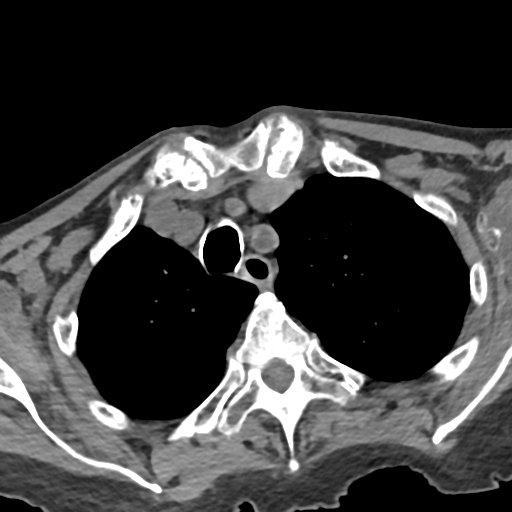
[im 7/86  bone]
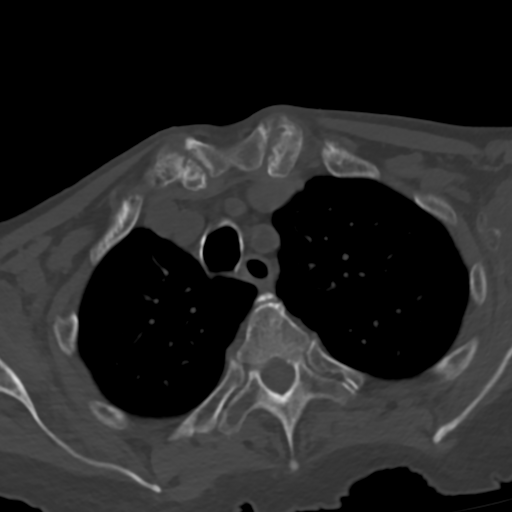
[im 20/86  bone]
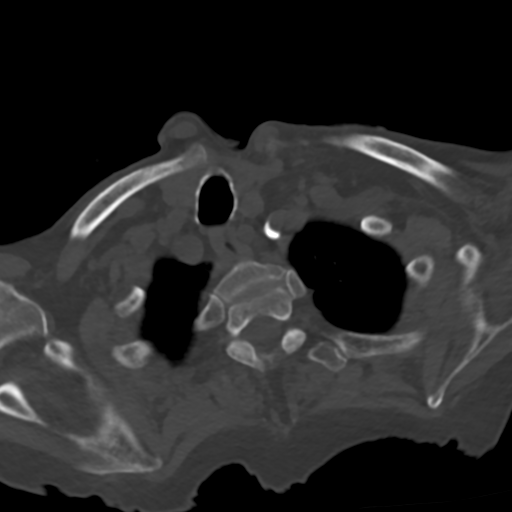
[im 27/86  bone]
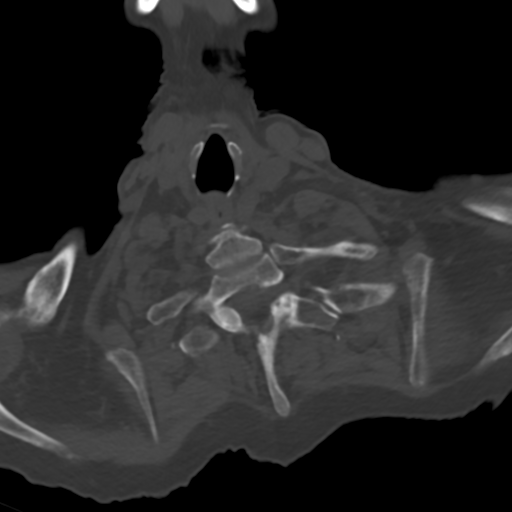
[im 40/86  bone]
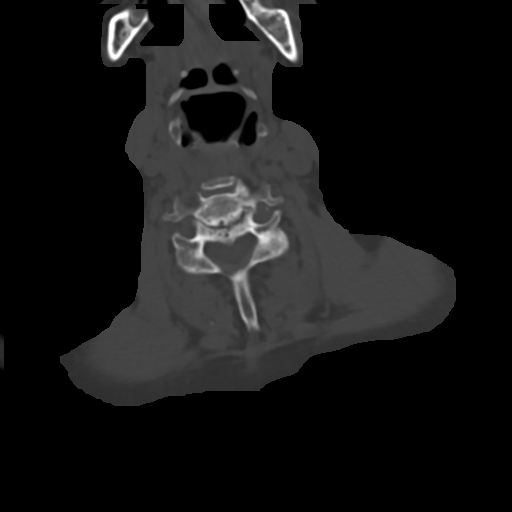
[im 46/86  soft-tissue]
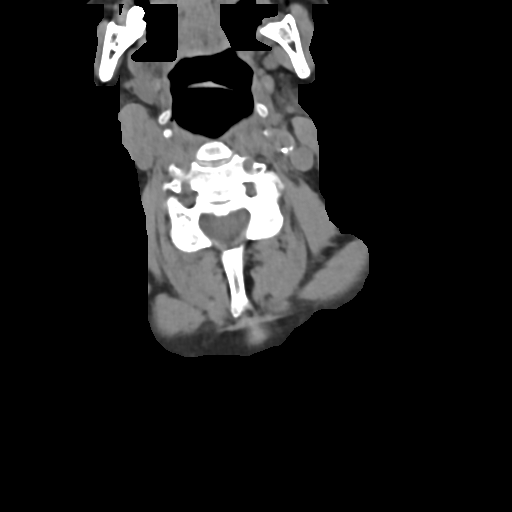
[im 46/86  bone]
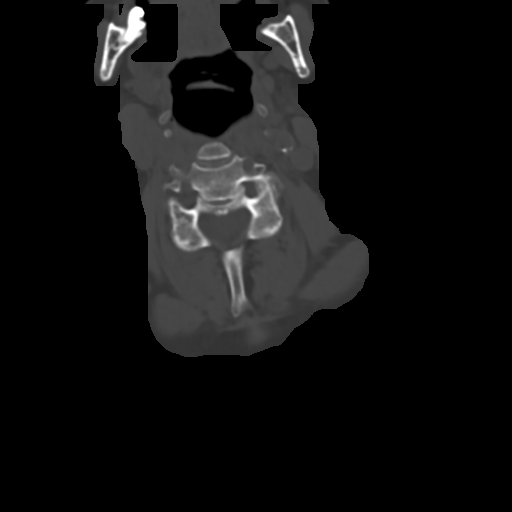
[im 59/86  bone]
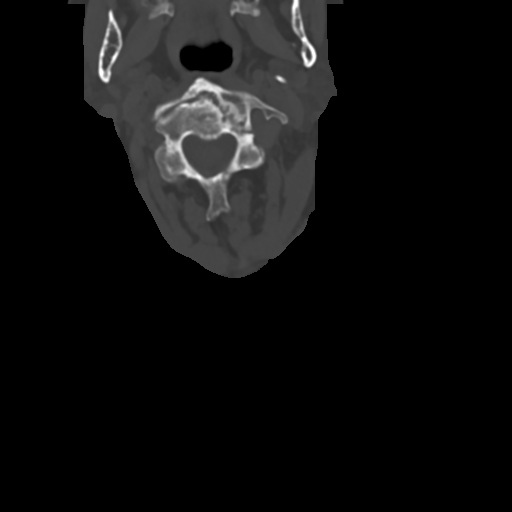
[im 66/86  bone]
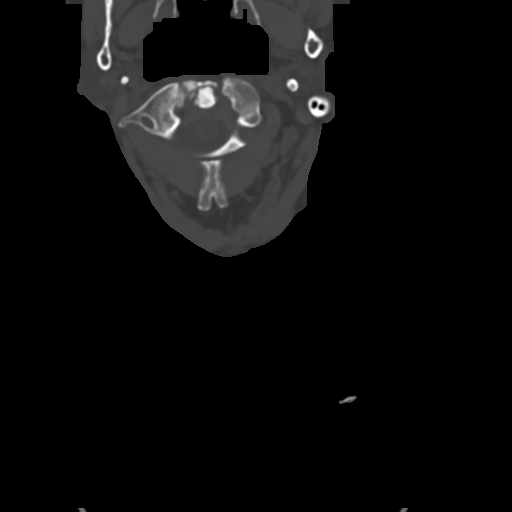
[im 79/86  bone]
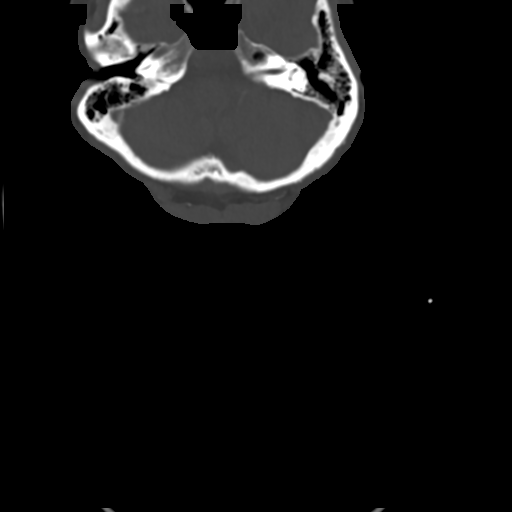

[Series 5: sag bone · sagittal · 0.31mm/px · 5 of 59 slices shown, 6 images]
[im 20/59  bone]
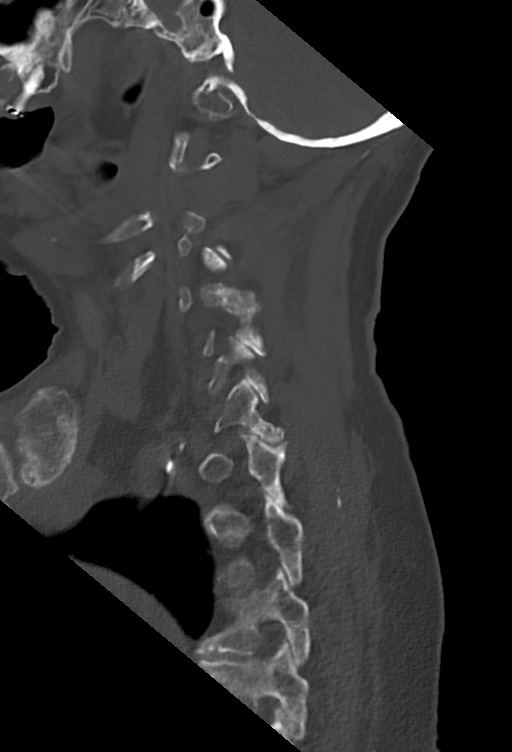
[im 25/59  bone]
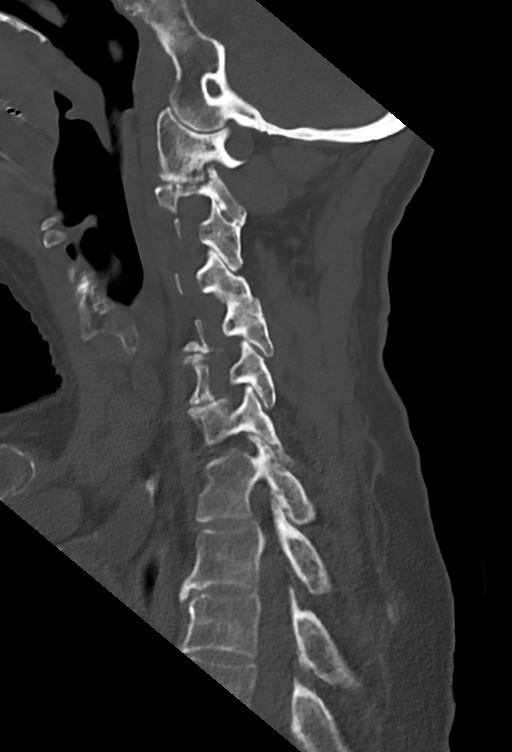
[im 30/59  soft-tissue]
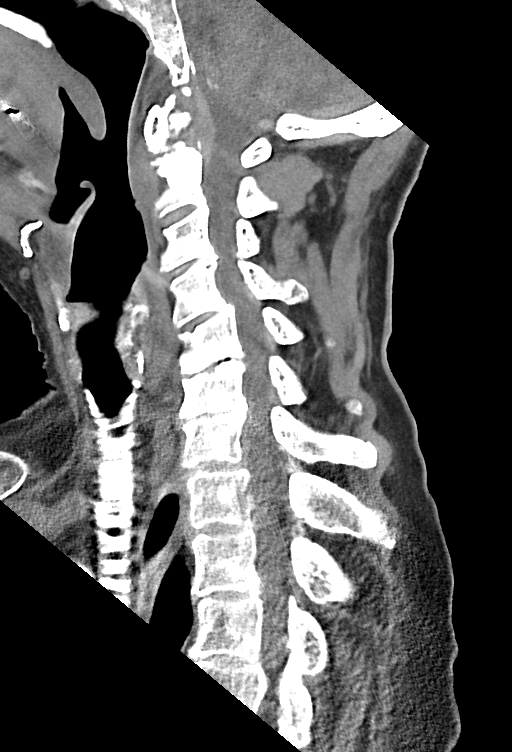
[im 30/59  bone]
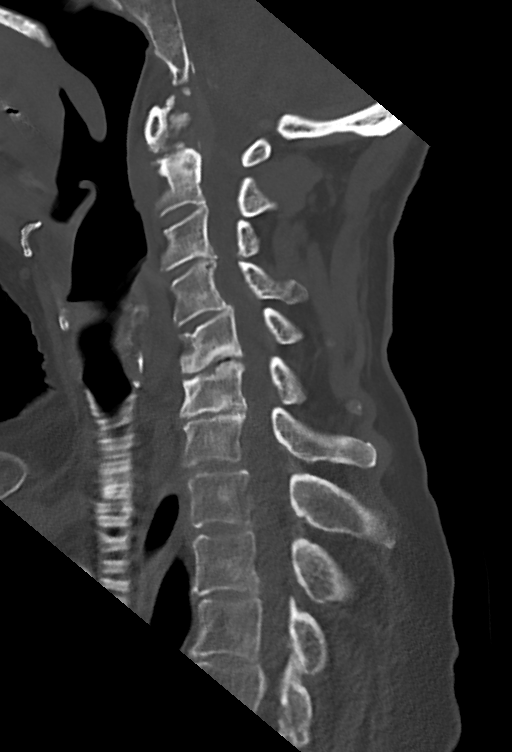
[im 34/59  bone]
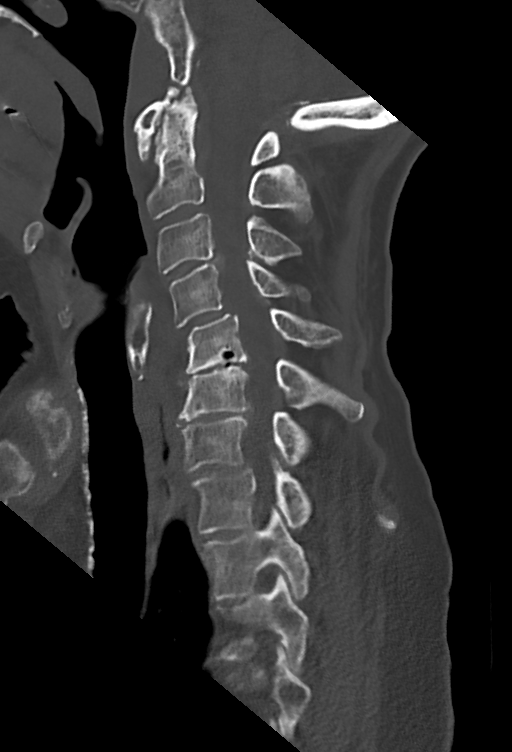
[im 39/59  bone]
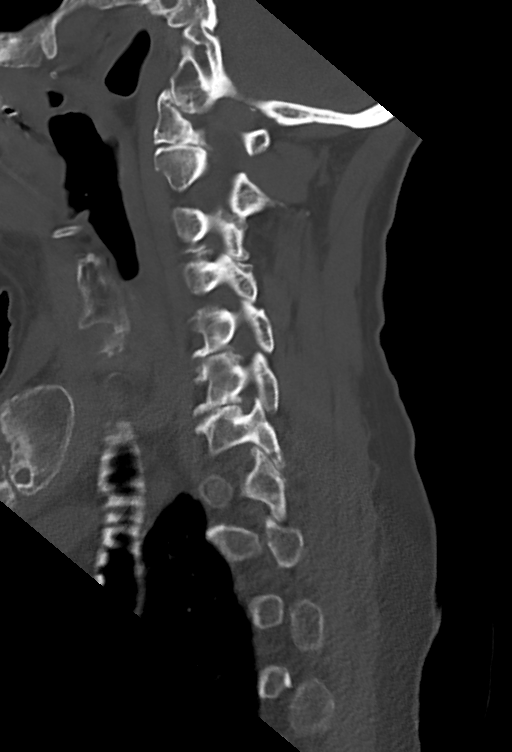

[13 of 27 positions shown; findings below may reference images not displayed]

FINDINGS: CT HEAD FINDINGS

Brain: Mild age-related atrophy and chronic microvascular ischemic
changes. There is no acute intracranial hemorrhage. No mass effect
or midline shift. No extra-axial fluid collection.

Vascular: No hyperdense vessel or unexpected calcification.

Skull: Normal. Negative for fracture or focal lesion.

Sinuses/Orbits: No acute finding.

Other: None

CT CERVICAL SPINE FINDINGS

Alignment: No acute subluxation. There is straightening of normal
cervical lordosis which may be positional or due to muscle spasm.
Grade 1 C4-C5 anterolisthesis.

Skull base and vertebrae: No acute fracture.

Soft tissues and spinal canal: No prevertebral fluid or swelling. No
visible canal hematoma.

Disc levels:  Multilevel degenerative changes.

Upper chest: Negative.

Other: Bilateral carotid bulb calcified plaques.
IMPRESSION: 1. No acute intracranial pathology. Mild age-related atrophy and
chronic microvascular ischemic changes.
2. No acute cervical spine fracture or subluxation. Multilevel
degenerative changes.

## 2023-12-21 IMAGING — RF DG ELBOW COMPLETE 3+V*L*
1 series · 5 of 5 positions shown · non-contrast
Comparison: Yesterday

CLINICAL DATA: Intraoperative imaging of fracture fixation

EXAM:
LEFT ELBOW - COMPLETE 3+ VIEW

[Series 1: run · 5 of 5 slices shown]
[im 1/5]
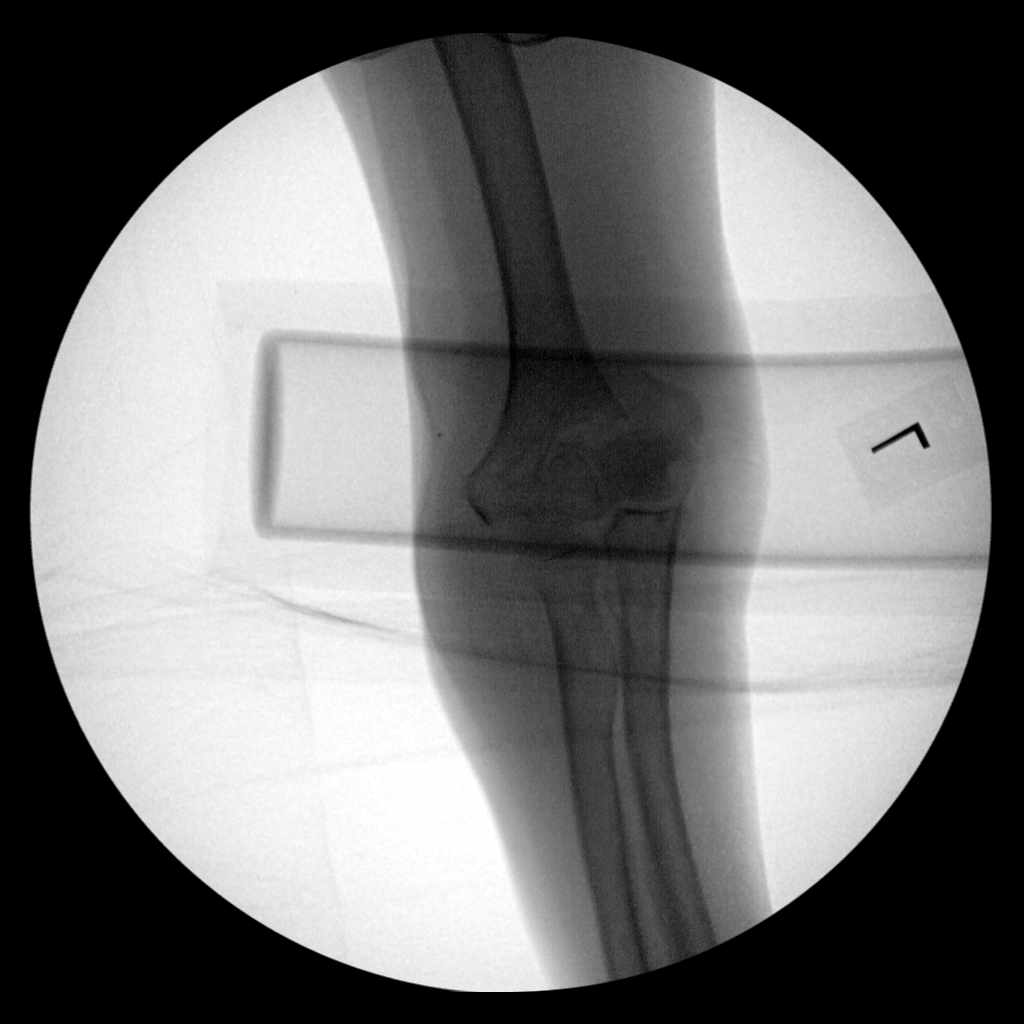
[im 2/5]
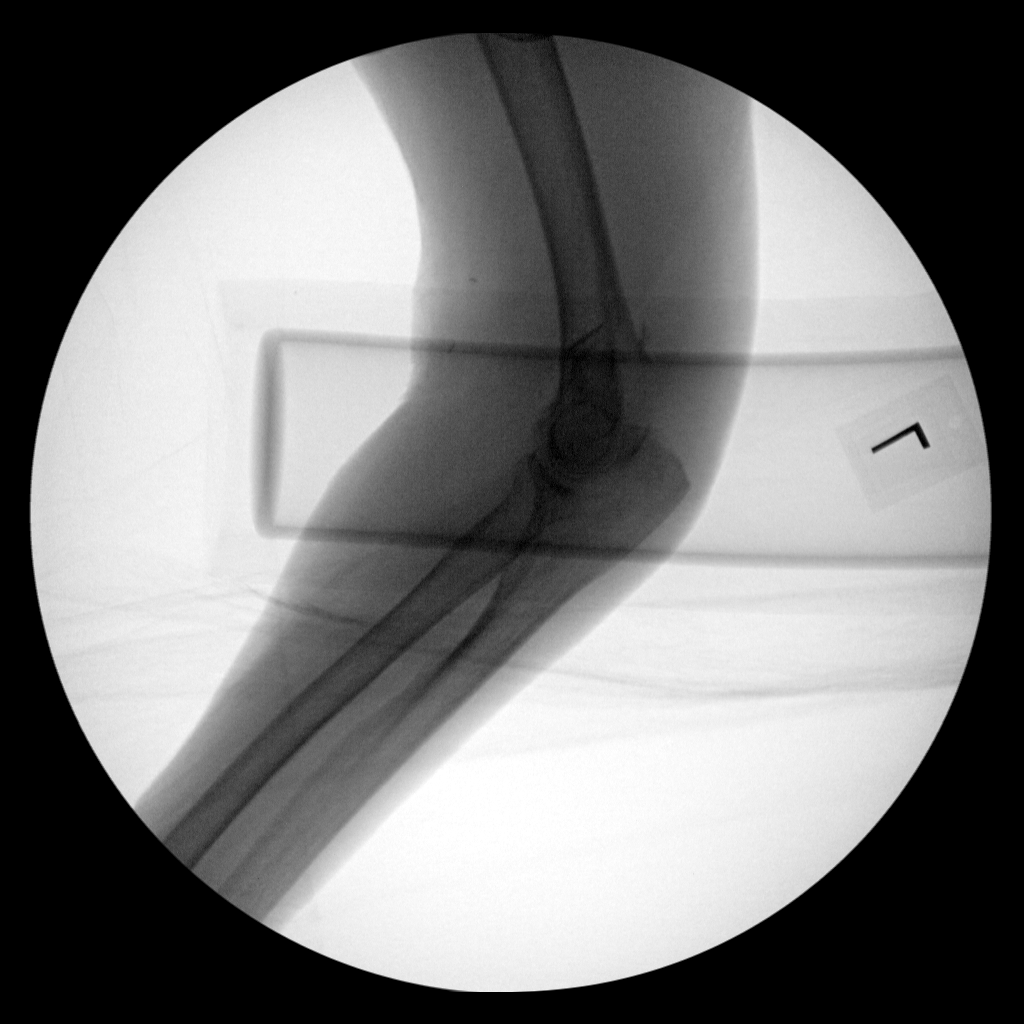
[im 3/5]
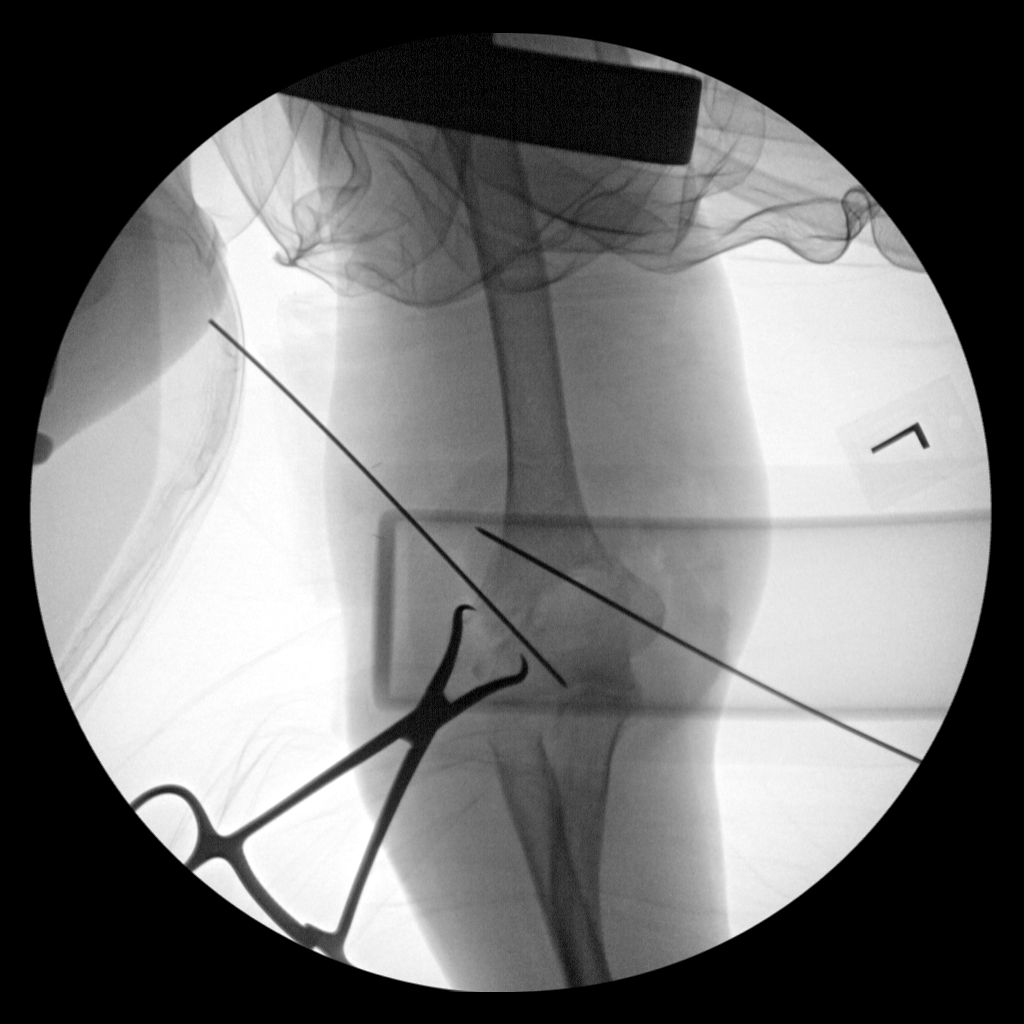
[im 4/5]
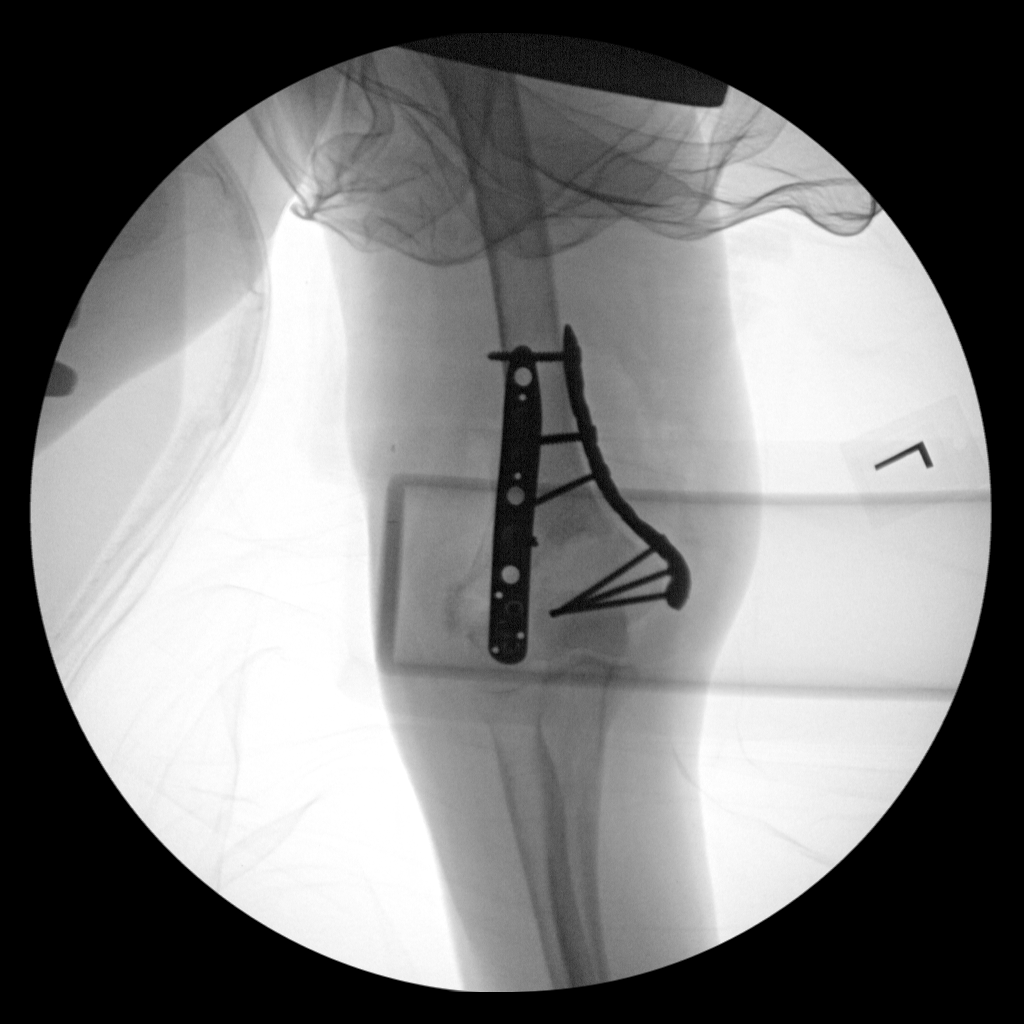
[im 5/5]
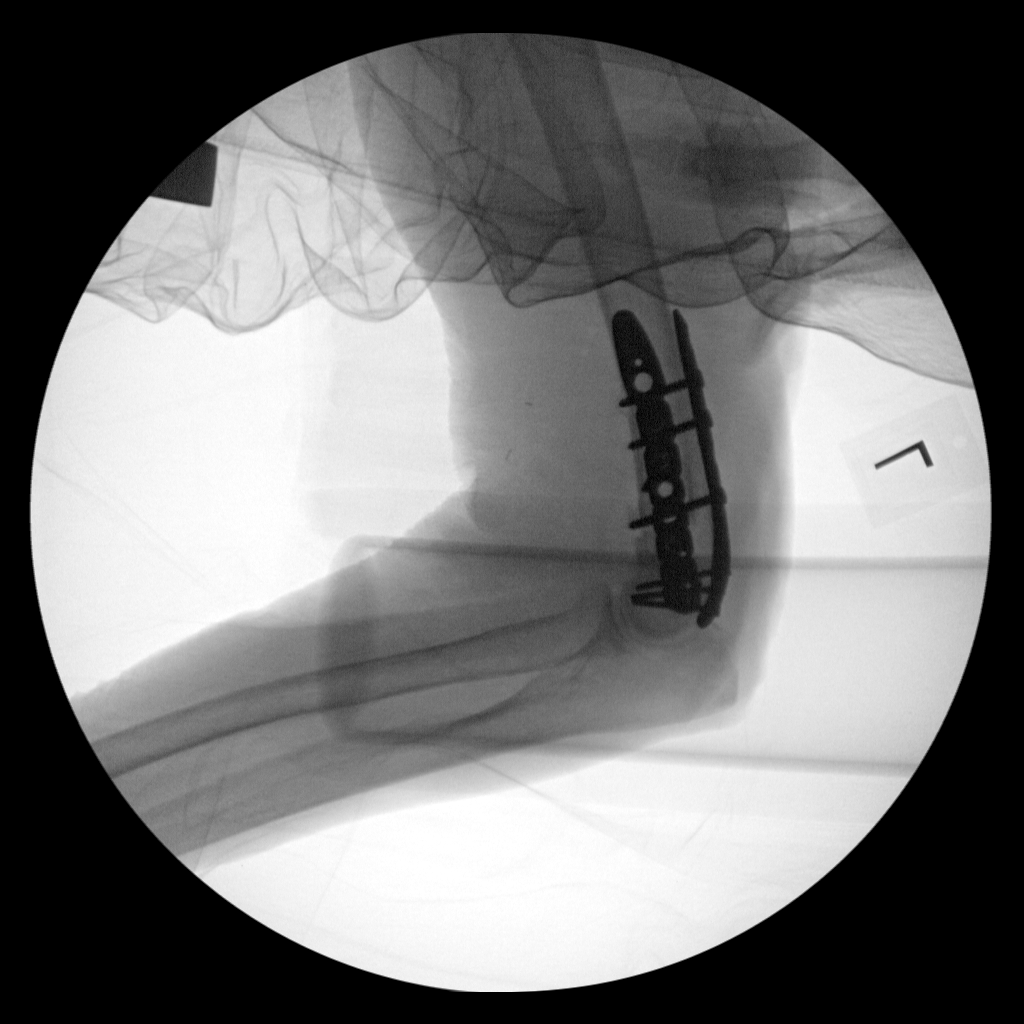

[5 of 5 positions shown; findings below may reference images not displayed]

FINDINGS: Multiple intraoperative images which demonstrate placement of 2
plate and screw fixation devices across the previously described
distal humerus fracture. Alignment is significantly improved. One of
the plates is identified medially i and n the other is identified
posteriorly.
IMPRESSION: Intraoperative imaging of distal humerus fixation.
